# Patient Record
Sex: Male | Born: 1965
Health system: Southern US, Community
[De-identification: ages and names within clinical notes are randomized; demographics above are authoritative.]

## PROBLEM LIST (undated history)

## (undated) DIAGNOSIS — K219 Gastro-esophageal reflux disease without esophagitis: Secondary | ICD-10-CM

## (undated) DIAGNOSIS — T7840XA Allergy, unspecified, initial encounter: Secondary | ICD-10-CM

## (undated) DIAGNOSIS — F319 Bipolar disorder, unspecified: Secondary | ICD-10-CM

## (undated) DIAGNOSIS — N200 Calculus of kidney: Secondary | ICD-10-CM

## (undated) DIAGNOSIS — F1010 Alcohol abuse, uncomplicated: Secondary | ICD-10-CM

## (undated) HISTORY — DX: Alcohol abuse, uncomplicated: F10.10

## (undated) HISTORY — DX: Allergy, unspecified, initial encounter: T78.40XA

## (undated) HISTORY — DX: Calculus of kidney: N20.0

## (undated) HISTORY — DX: Bipolar disorder, unspecified: F31.9

## (undated) HISTORY — DX: Gastro-esophageal reflux disease without esophagitis: K21.9

## (undated) HISTORY — PX: ESOPHAGEAL DILATION: SHX303

## (undated) HISTORY — PX: TONSILECTOMY/ADENOIDECTOMY WITH MYRINGOTOMY: SHX6125

---

## 2005-10-30 ENCOUNTER — Emergency Department: Payer: Self-pay | Admitting: Emergency Medicine

## 2005-10-30 ENCOUNTER — Other Ambulatory Visit: Payer: Self-pay

## 2010-12-28 ENCOUNTER — Encounter: Payer: Self-pay | Admitting: Family Medicine

## 2010-12-28 ENCOUNTER — Ambulatory Visit: Payer: Self-pay | Admitting: Family Medicine

## 2010-12-28 DIAGNOSIS — R609 Edema, unspecified: Secondary | ICD-10-CM

## 2010-12-28 DIAGNOSIS — M25579 Pain in unspecified ankle and joints of unspecified foot: Secondary | ICD-10-CM

## 2010-12-28 DIAGNOSIS — M109 Gout, unspecified: Secondary | ICD-10-CM

## 2010-12-29 ENCOUNTER — Ambulatory Visit: Payer: Self-pay | Admitting: Family Medicine

## 2011-01-01 ENCOUNTER — Telehealth: Payer: Self-pay | Admitting: Family Medicine

## 2011-01-08 NOTE — Progress Notes (Signed)
  Phone Note Call from Patient   Summary of Call: PATIENT CALLED STATED THAT HE NEED A REFILL ON INDOMETHACINE 50 MG PLEASE SEND [facility] HERE. He says that he will not be able to see his PCP until Tues (in 5 days).   He says that it is helping alleviate symptoms.   Initial call taken by: Eugenio Hoes,  January 01, 2011 4:25 PM  Follow-up for Phone Call        Will Refill x 1 and tell the patient that he needs to make sure he follows up with his PCP on Tues as scheduled.  Take with food and drink plenty of clear fluids.   Follow-up by: Standley Dakins MD,  January 01, 2011 5:04 PM    Prescriptions: INDOMETHACIN 50 MG CAPS (INDOMETHACIN) take 1 by mouth three times a day with food as needed ankle, foot pain and swelling  #12 x 0   Entered and Authorized by:   Standley Dakins MD   Signed by:   Standley Dakins MD on 01/01/2011   Method used:   Electronically to        [facility] [address] (retail)       [address]       [place], [place]  [address]       Ph: [phone]       Fax: [phone]   RxID:   [id number]

## 2011-01-08 NOTE — Letter (Signed)
Summary: histoyr form  histoyr form   Imported By: Eugenio Hoes 01/02/2011 13:54:14  _____________________________________________________________________  External Attachment:    Type:   Image     Comment:   External Document

## 2011-01-08 NOTE — Assessment & Plan Note (Signed)
Summary: Gout In Right Foot   Vital Signs:  Patient Profile:   45 Years Old Male CC:      right ankle pain Weight:      199 pounds Temp:     97.9 degrees F oral Pulse rate:   76 / minute Pulse rhythm:   regular Resp:     16 per minute BP sitting:   110 / 80  (left arm) Cuff size:   regular  Pt. in pain?   yes    Location:   right ankle  Vitals Entered By: Providence Crosby LPN (December 28, 2010 1:45 PM)                   Current Allergies (reviewed today): No known allergies History of Present Illness History from: patient Reason for visit: see chief complaint Chief Complaint: right ankle pain History of Present Illness: The patient presented today complaining of an exacerbation of gout in the right 1st toe and right ankle.  He said that he has been having gout attacks intermittently for the past several years.  He says that he has noticed that it started 2 weeks ago, subsided and then became worse in the past 2 days as he has been more physically active at work walking back and forth.  He says he has never been medically diagnosed with gout and has not had any xrays or blood work and he is refusing to have that done at this time.  He says that he does not have medical insurance and will not pay for that.  He says that he feels crystals in his big toe and has been concerned about the pain and swelling. He has bipolar disease and reports that he has been stable on medications.   No injuries to his right ankle, foot or extremities reported.    REVIEW OF SYSTEMS Constitutional Symptoms      Denies fever, chills, night sweats, weight loss, weight gain, and fatigue.  Eyes       Denies change in vision, eye pain, eye discharge, glasses, contact lenses, and eye surgery. Ear/Nose/Throat/Mouth       Denies hearing loss/aids, change in hearing, ear pain, ear discharge, dizziness, frequent runny nose, frequent nose bleeds, sinus problems, sore throat, hoarseness, and tooth pain or bleeding.    Respiratory       Denies dry cough, productive cough, wheezing, shortness of breath, asthma, bronchitis, and emphysema/COPD.  Cardiovascular       Denies murmurs, chest pain, and tires easily with exhertion.    Gastrointestinal       Denies stomach pain, nausea/vomiting, diarrhea, constipation, blood in bowel movements, and indigestion. Genitourniary       Denies painful urination, kidney stones, and loss of urinary control. Neurological       Denies paralysis, seizures, and fainting/blackouts. Musculoskeletal       Complains of joint pain, joint stiffness, and decreased range of motion.      Denies muscle pain, redness, swelling, muscle weakness, and gout.      Comments: right ankle Skin       Denies bruising, unusual mles/lumps or sores, and hair/skin or nail changes.  Psych       Denies mood changes, temper/anger issues, anxiety/stress, speech problems, depression, and sleep problems.  Past History:  Family History: Last updated: 12/28/2010 Healthy per patient  Social History: Last updated: 12/28/2010 Pt denies ETOH, tobacco and recreational drugs  Past Medical History: Bipolar Disorder Gout (?) Anxiety Disorder  Family History: Healthy per patient  Social History: Pt denies ETOH, tobacco and recreational drugs Physical Exam General appearance: well developed, well nourished, no acute distress Head: normocephalic, atraumatic Eyes: conjunctivae and lids normal Ears: normal, no lesions or deformities Nasal: mucosa pink, nonedematous, no septal deviation, turbinates normal Oral/Pharynx: tongue normal, posterior pharynx without erythema or exudate Neck: neck supple,  trachea midline, no masses Extremities: LLE normal; Right ankle mildly swollen; Right big toe not swollen, red or hot; tender with palpation; pt c/o pain with slight movement of the right ankle joint; it was not hot, red but mildly swollen;  Neurological: grossly intact and non-focal Skin: no obvious  rashes or lesions MSE: oriented to time, place, and person Assessment New Problems: ANKLE EDEMA (ICD-782.3) ANKLE PAIN, RIGHT (ICD-719.47) ? of GOUTY ARTHROPATHY UNSPECIFIED (ICD-274.00)   Patient Education: Patient and/or caregiver instructed in the following: rest. The risks, benefits and possible side effects were clearly explained and discussed with the patient.  The patient verbalized clear understanding.  The patient was given instructions to return if symptoms don't improve, worsen or new changes develop.  If it is not during clinic hours and the patient cannot get back to this clinic then the patient was told to seek medical care at an available urgent care or emergency department.  The patient verbalized understanding.   Demonstrates willingness to comply.  Plan New Medications/Changes: INDOMETHACIN 50 MG CAPS (INDOMETHACIN) take 1 by mouth three times a day with food as needed ankle, foot pain and swelling  #12 x 0, 12/28/2010, Teruko Joswick MD  Planning Comments:   THE PATIENT REFUSED TO HAVE FURTHER TESTING TO DETERMINE TRUE CAUSE OF THE ANKLE PAIN AND EDEMA.  HE REFUSED TO GET XRAY DONE AND REFUSED TO GET BLOOD WORK DONE.  I EXPLAINED THE RISKS TO THE PATIENT WHO VERBALIZED UNDERSTANDING.  Follow Up: Follow up in 2-3 days if no improvement, Follow up on an as needed basis, Follow up with Primary Physician  The patient and/or caregiver has been counseled thoroughly with regard to medications prescribed including dosage, schedule, interactions, rationale for use, and possible side effects and they verbalize understanding.  Diagnoses and expected course of recovery discussed and will return if not improved as expected or if the condition worsens. Patient and/or caregiver verbalized understanding.  Prescriptions: INDOMETHACIN 50 MG CAPS (INDOMETHACIN) take 1 by mouth three times a day with food as needed ankle, foot pain and swelling  #12 x 0   Entered and Authorized by:    Standley Dakins MD   Signed by:   Standley Dakins MD on 12/28/2010   Method used:   Electronically to        [facility] [address]retail)       [address], [address]       [place], [place]  [address]       Ph: [phone]       Fax: [phone]   RxID:   [id number]   Patient Instructions: 1)  Go to the pharmacy and pick up your prescription (s).  It may take up to 30 mins for electronic prescriptions to be delivered to the pharmacy.  Please call if your pharmacy has not received your prescriptions after 30 minutes.   2)  Return or go to the ER if no improvement or symptoms getting worse.   3)  I still recommend that you see your regular doctor to have xrays done and blood  work to check for uric acid levels.   4)  If you have no improvement after taking indomethacin tablets please stop the medication and see your doctor immediately.  5)  The patient was informed that there is no on-call provider or services available at this clinic during off-hours (when the clinic is closed).  If the patient developed a problem or concern that required immediate attention, the patient was advised to go the the nearest available urgent care or emergency department for medical care.  The patient verbalized understanding.       I pleaded with the patient to allow me to do more diagnostic studies like xray and blood work but he refused.  I explained the risks to him and he verbalized understanding.  He says that he doesn't have insurance and will not pay to have additional tests run, etc.  I told him that I was not convinced that he really had true gout.  He didn't have much inflammation in the joints where he was having so much pain that I could see on my physical exam today.  I really encouraged him to let me do some more tests to find out more but he refused.  Therefore, I treated him for a possible gout flare up and asked him to please follow up with his PCP so that  they could do the investigation.   I only gave him meds for a few days so that he would have to go and see his PCP soon.  Rodney Langton, MD, CDE, [facility]

## 2012-04-02 LAB — COMPREHENSIVE METABOLIC PANEL
Albumin: 4.3 g/dL (ref 3.4–5.0)
Anion Gap: 6 — ABNORMAL LOW (ref 7–16)
BUN: 6 mg/dL — ABNORMAL LOW (ref 7–18)
Bilirubin,Total: 0.4 mg/dL (ref 0.2–1.0)
Chloride: 106 mmol/L (ref 98–107)
Co2: 31 mmol/L (ref 21–32)
Creatinine: 1.03 mg/dL (ref 0.60–1.30)
EGFR (Non-African Amer.): >60
Potassium: 3.5 mmol/L (ref 3.5–5.1)
SGOT(AST): 86 U/L — ABNORMAL HIGH (ref 15–37)
SGPT (ALT): 124 U/L — ABNORMAL HIGH
Total Protein: 7.8 g/dL (ref 6.4–8.2)

## 2012-04-02 LAB — CBC
HCT: 46.3 % (ref 40.0–52.0)
MCH: 33.6 pg (ref 26.0–34.0)
MCV: 100 fL (ref 80–100)
Platelet: 268 10*3/uL (ref 150–440)
RDW: 15.3 % — ABNORMAL HIGH (ref 11.5–14.5)
WBC: 5.2 10*3/uL (ref 3.8–10.6)

## 2012-04-02 LAB — ACETAMINOPHEN LEVEL: Acetaminophen: <2 ug/mL

## 2012-04-02 LAB — DRUG SCREEN, URINE
Amphetamines, Ur Screen: NEGATIVE (ref ?–1000)
Barbiturates, Ur Screen: NEGATIVE (ref ?–200)
Benzodiazepine, Ur Scrn: NEGATIVE (ref ?–200)
Cannabinoid 50 Ng, Ur ~~LOC~~: NEGATIVE (ref ?–50)
MDMA (Ecstasy)Ur Screen: NEGATIVE (ref ?–500)
Methadone, Ur Screen: NEGATIVE (ref ?–300)
Opiate, Ur Screen: NEGATIVE (ref ?–300)

## 2012-04-02 LAB — SALICYLATE LEVEL: Salicylates, Serum: <1.7 mg/dL

## 2012-04-02 LAB — ETHANOL: Ethanol %: 0.375 % (ref 0.000–0.080)

## 2012-04-03 ENCOUNTER — Inpatient Hospital Stay: Payer: Self-pay | Admitting: Psychiatry

## 2012-04-07 LAB — LITHIUM LEVEL: Lithium: 1 mmol/L

## 2012-07-06 ENCOUNTER — Ambulatory Visit: Payer: Self-pay | Admitting: Gastroenterology

## 2012-10-12 DIAGNOSIS — N200 Calculus of kidney: Secondary | ICD-10-CM

## 2012-10-12 HISTORY — DX: Calculus of kidney: N20.0

## 2014-08-31 ENCOUNTER — Emergency Department: Payer: Self-pay | Admitting: Emergency Medicine

## 2014-08-31 LAB — URINALYSIS, COMPLETE
BILIRUBIN, UR: NEGATIVE
GLUCOSE, UR: NEGATIVE mg/dL (ref 0–75)
KETONE: NEGATIVE
LEUKOCYTE ESTERASE: NEGATIVE
Nitrite: NEGATIVE
PH: 6 (ref 4.5–8.0)
Protein: 100
RBC,UR: 540 /HPF (ref 0–5)
SQUAMOUS EPITHELIAL: NONE SEEN
Specific Gravity: 1.024 (ref 1.003–1.030)

## 2014-09-02 LAB — URINE CULTURE

## 2015-02-03 NOTE — H&P (Signed)
PATIENT NAME:  Jason Hooper, Jason Hooper MR#:  [id number] DATE OF BIRTH:  January 18, 1966  DATE OF ADMISSION:  04/03/2012, assessed on 04/04/2012  REFERRING PHYSICIAN: Lenise Arena, MD   ATTENDING PHYSICIAN: Tanesia Butner B. Bary Leriche, MD    IDENTIFYING DATA: Mr. Jason Hooper is a 49 year old male with history of bipolar illness and alcohol abuse.   CHIEF COMPLAINT: "It is all one misunderstanding."   HISTORY OF PRESENT ILLNESS: Mr. Jason Hooper was brought to the hospital by the police after he reportedly put a gun to his head while drunk. The patient denies. He believes that he was cleaning his 14 year old son's gun at home and was looking into it when his mother saw him. His blood alcohol level was almost 400 at the time of admission. The patient admits that he may not remember everything. He is very blase about his drinking, believes that he drinks only on Saturday and Sunday, and other days he works or takes care of his children. According to his mother, he has not been employed for the past six weeks and drinks vodka daily. The patient believes that he does not need alcohol detox and will go back to drinking sparingly. He is not interested in substance abuse treatment, inpatient or outpatient. He has been following up with his primary provider for treatment of bipolar illness and has been with Dr. Jeananne Rama for many years. In spite of the mother's urging, the patient or his provider do not want to make any changes. He has been relatively stable on a combination of lithium--lithium level is therapeutic, BuSpar, trazodone and Seroquel. The patient does not wish to make any medication changes. He does not wish to see a new provider. He denies any symptoms of depression or anxiety, denies symptoms suggestive of bipolar mania, no psychotic symptoms. He denies, other than alcohol, illicit substance use or prescription pill abuse.   PAST PSYCHIATRIC HISTORY: He was hospitalized in 2001. At that time he had a serious drug problem. Most  likely he was sent to [facility] for substance abuse treatment. He underwent several other substance abuse treatments. His longest period of sobriety was one year. He denies any suicide attempt or other hospitalizations.   FAMILY PSYCHIATRIC HISTORY: None reported.   PAST MEDICAL HISTORY: Hypertension.  ALLERGIES: Codeine.   MEDICATIONS ON ADMISSION:  1. Lithium 600 mg b.i.d.  2. BuSpar 10 mg t.i.d.  3. Propranolol 10 mg q.i.d.  4. Trazodone 50 mg at night. 5. Seroquel XR 300 mg at night.   SOCIAL HISTORY: He has been divorced for 13 years. His wife lives in the area. He stays in touch with two of his children, ages 22 and 14--who is in college now. He even believes that he spends time with them. He was recently laid off from work at [employer]. He denies that alcohol was the reason. He lives with his mother, who is 10 years, and tired of taking care of him. He denies any sexual or physical abuse when growing up.    REVIEW OF SYSTEMS: CONSTITUTIONAL: No fevers or chills. No weight changes. EYES: No double or blurred vision. ENT: No hearing loss. RESPIRATORY: No shortness of breath or cough. CARDIOVASCULAR: No chest pain or orthopnea. GASTROINTESTINAL: No abdominal pain, nausea, vomiting, or diarrhea. GU: No incontinence or frequency. ENDOCRINE: No heat or cold intolerance. LYMPHATIC: No anemia or easy bruising. INTEGUMENTARY: No acne or rash. MUSCULOSKELETAL: No muscle or joint pain. NEUROLOGIC: No tingling or weakness. PSYCHIATRIC: See history of present illness for details.   PHYSICAL EXAMINATION:  VITAL SIGNS: Blood pressure 154/103, pulse 67, respirations 18, temperature 97.6.   GENERAL: This is a well-developed male in no acute distress.   HEENT: The pupils are equal, round, and reactive to light. Sclerae are anicteric.   NECK: Supple. No thyromegaly.   LUNGS: Clear to auscultation. No dullness to percussion.   HEART: Regular rhythm and rate. No murmurs, rubs, or gallops.   ABDOMEN: Soft,  nontender, nondistended. Positive bowel sounds.   MUSCULOSKELETAL: Normal muscle strength in all extremities.   SKIN: No rashes or bruises.   LYMPHATIC: No cervical adenopathy.   NEUROLOGICAL: Cranial nerves II through XII are intact.   LABORATORY, DIAGNOSTIC AND RADIOLOGICAL DATA:  Chemistries are within normal limits except for blood glucose of 122.  Blood alcohol level on admission 0.375.  LFTs are within normal limits except for AST 86, ALT of 124.  TSH 1.58.  Lithium serum 0.62.  Urine toxicology screen negative for substances.  CBC within normal limits.  Serum acetaminophen and salicylates are low.   MENTAL STATUS EXAMINATION ON ADMISSION: The patient is alert and oriented to person, place, time, and situation somewhat. He does not remember exact details leading to his admission. He is pleasant, polite, and cooperative. He is well groomed and casually dressed. He maintains good eye contact. His speech is of normal rhythm, rate, and volume. Mood is fine with full affect. Thought processing is logical and goal oriented. Thought content: He denies suicidal or homicidal ideation but was admitted after reportedly pointing a gun to his head while drunk. There are no delusions or paranoia. There are no auditory or visual hallucinations. His cognition is grossly intact. He registers three out of three and recalls three out of three objects after five minutes. He can spell world forwards and backwards. He can name three past presidents. His insight and judgment are questionable.   SUICIDE RISK ASSESSMENT ON ADMISSION: This is a patient with a long history of alcoholism who reportedly put a gun to his head while drunk. He denies symptoms of depression, anxiety, or psychosis. He denies feeling suicidal or homicidal but wants to continue drinking.      DIAGNOSES:  AXIS I:  1. Alcohol dependence.  2. Alcohol intoxication. 3. Bipolar affective disorder, most recent episode depressed.   AXIS  II: Deferred.   AXIS III: Hypertension.   AXIS IV: Substance abuse, mental illness, poor insight into his problem.   AXIS V: Global Assessment of Functioning score on admission 25.   PLAN: The patient was admitted to [facility] for safety, stabilization, and medication management. He was initially placed on suicide precautions and was closely monitored for any unsafe behaviors. He underwent full psychiatric and risk assessment. He received pharmacotherapy, individual and group psychotherapy, substance abuse counseling, and support from therapeutic milieu.   1. Alcohol detox: The patient was placed on alcohol detox protocol. He denies heavy drinking and wants to be discharged as soon as possible.  2. Substance abuse treatment: He denies inpatient, outpatient or rehab substance abuse treatment.  3. Bipolar: He does not wish any medication changes, wants to be continued on the same medication as prescribed by Dr. Jeananne Rama. He does not want to change his providers. However, given the fact that he is a crisis patient, we will make an appointment at the [facility] with a psychiatrist. Hopefully he will wise up and take substance abuse counseling as well.  4. Disposition: Most likely with his mother. Follow up with [facility].   ____________________________ Wardell Honour Maleko Greulich, MD jbp:cbb D: 04/04/2012 17:24:09 ET T: 04/04/2012 18:20:30 ET JOB#: [id number]  cc: Yeray Tomas B. Bary Leriche, MD, <Dictator> Clovis Fredrickson MD ELECTRONICALLY SIGNED 04/08/2012 9:17

## 2015-02-28 ENCOUNTER — Inpatient Hospital Stay
Admission: EM | Admit: 2015-02-28 | Discharge: 2015-03-05 | DRG: 897 | Disposition: A | Discharge status: Home / Self Care | Payer: Self-pay | Attending: Internal Medicine | Admitting: Internal Medicine

## 2015-02-28 ENCOUNTER — Encounter: Payer: Self-pay | Admitting: Emergency Medicine

## 2015-02-28 DIAGNOSIS — R569 Unspecified convulsions: Secondary | ICD-10-CM | POA: Diagnosis present

## 2015-02-28 DIAGNOSIS — F10230 Alcohol dependence with withdrawal, uncomplicated: Secondary | ICD-10-CM

## 2015-02-28 DIAGNOSIS — F29 Unspecified psychosis not due to a substance or known physiological condition: Secondary | ICD-10-CM | POA: Diagnosis present

## 2015-02-28 DIAGNOSIS — I10 Essential (primary) hypertension: Secondary | ICD-10-CM | POA: Diagnosis present

## 2015-02-28 DIAGNOSIS — F10239 Alcohol dependence with withdrawal, unspecified: Secondary | ICD-10-CM | POA: Diagnosis present

## 2015-02-28 DIAGNOSIS — F10231 Alcohol dependence with withdrawal delirium: Principal | ICD-10-CM | POA: Diagnosis present

## 2015-02-28 DIAGNOSIS — F10931 Alcohol use, unspecified with withdrawal delirium: Secondary | ICD-10-CM

## 2015-02-28 DIAGNOSIS — E876 Hypokalemia: Secondary | ICD-10-CM | POA: Diagnosis present

## 2015-02-28 DIAGNOSIS — F10930 Alcohol use, unspecified with withdrawal, uncomplicated: Secondary | ICD-10-CM

## 2015-02-28 DIAGNOSIS — F312 Bipolar disorder, current episode manic severe with psychotic features: Secondary | ICD-10-CM | POA: Diagnosis present

## 2015-02-28 LAB — CBC
HCT: 53.6 % — ABNORMAL HIGH (ref 40.0–52.0)
HEMOGLOBIN: 17.9 g/dL (ref 13.0–18.0)
MCH: 35.6 pg — ABNORMAL HIGH (ref 26.0–34.0)
MCHC: 33.5 g/dL (ref 32.0–36.0)
MCV: 106.2 fL — ABNORMAL HIGH (ref 80.0–100.0)
Platelets: 268 10*3/uL (ref 150–440)
RBC: 5.04 MIL/uL (ref 4.40–5.90)
RDW: 14.6 % — ABNORMAL HIGH (ref 11.5–14.5)
WBC: 8.6 10*3/uL (ref 3.8–10.6)

## 2015-02-28 LAB — LITHIUM LEVEL: Lithium Lvl: <0.06 mmol/L — ABNORMAL LOW (ref 0.60–1.20)

## 2015-02-28 MED ORDER — NICOTINE 21 MG/24HR TD PT24
21.0000 mg | MEDICATED_PATCH | Freq: Every day | TRANSDERMAL | Status: DC
  Administered 2015-02-28: 21 mg via TRANSDERMAL
  Administered 2015-03-01: 10:00:00 via TRANSDERMAL
  Administered 2015-03-02 – 2015-03-04 (×3): 21 mg via TRANSDERMAL
  Filled 2015-02-28 (×3): qty 1

## 2015-02-28 MED ORDER — MIDAZOLAM HCL 2 MG/2ML IJ SOLN
2.0000 mg | Freq: Once | INTRAMUSCULAR | Status: AC
  Administered 2015-02-28: 2 mg via INTRAMUSCULAR

## 2015-02-28 MED ORDER — LORAZEPAM 2 MG/ML IJ SOLN
0.0000 mg | Freq: Four times a day (QID) | INTRAMUSCULAR | Status: DC
Start: 2015-02-28 — End: 2015-03-01
  Administered 2015-03-01: 1 mg via INTRAVENOUS

## 2015-02-28 MED ORDER — THIAMINE HCL 100 MG/ML IJ SOLN
INTRAMUSCULAR | Status: AC
  Administered 2015-02-28: 100 mg via INTRAVENOUS
  Filled 2015-02-28: qty 2

## 2015-02-28 MED ORDER — THIAMINE HCL 100 MG/ML IJ SOLN
100.0000 mg | Freq: Every day | INTRAMUSCULAR | Status: DC
  Administered 2015-02-28: 100 mg via INTRAVENOUS

## 2015-02-28 MED ORDER — NICOTINE 21 MG/24HR TD PT24
MEDICATED_PATCH | TRANSDERMAL | Status: AC
  Administered 2015-02-28: 21 mg via TRANSDERMAL
  Filled 2015-02-28: qty 1

## 2015-02-28 MED ORDER — VITAMIN B-1 100 MG PO TABS: 100.0000 mg | ORAL_TABLET | Freq: Every day | ORAL | Status: DC

## 2015-02-28 MED ORDER — LORAZEPAM 2 MG PO TABS: 0.0000 mg | ORAL_TABLET | Freq: Four times a day (QID) | ORAL | Status: DC

## 2015-02-28 MED ORDER — VITAMIN B-1 100 MG PO TABS
ORAL_TABLET | ORAL | Status: AC
  Administered 2015-02-28: 100 mg via ORAL
  Filled 2015-02-28: qty 1

## 2015-02-28 MED ORDER — LORAZEPAM 2 MG/ML IJ SOLN: 0.0000 mg | Freq: Two times a day (BID) | INTRAMUSCULAR | Status: DC

## 2015-02-28 MED ORDER — LORAZEPAM 2 MG PO TABS
0.0000 mg | ORAL_TABLET | Freq: Four times a day (QID) | ORAL | Status: DC
  Administered 2015-03-01 (×2): 2 mg via ORAL

## 2015-02-28 MED ORDER — SALINE SPRAY 0.65 % NA SOLN
1.0000 | Freq: Once | NASAL | Status: AC
  Administered 2015-02-28: 1 via NASAL
  Filled 2015-02-28: qty 44

## 2015-02-28 MED ORDER — MIDAZOLAM HCL 5 MG/5ML IJ SOLN
INTRAMUSCULAR | Status: AC
  Administered 2015-02-28: 2 mg via INTRAMUSCULAR
  Filled 2015-02-28: qty 5

## 2015-02-28 MED ORDER — VITAMIN B-1 100 MG PO TABS
100.0000 mg | ORAL_TABLET | Freq: Every day | ORAL | Status: DC
  Administered 2015-02-28 – 2015-03-05 (×5): 100 mg via ORAL
  Filled 2015-02-28 (×3): qty 1

## 2015-02-28 MED ORDER — LORAZEPAM 2 MG PO TABS
0.0000 mg | ORAL_TABLET | Freq: Two times a day (BID) | ORAL | Status: DC
  Administered 2015-03-01: 1 mg via ORAL

## 2015-02-28 MED ORDER — THIAMINE HCL 100 MG/ML IJ SOLN
Freq: Once | INTRAVENOUS | Status: AC
  Administered 2015-02-28: 21:00:00 via INTRAVENOUS
  Filled 2015-02-28: qty 1000

## 2015-02-28 MED ORDER — LORAZEPAM 2 MG/ML IJ SOLN: 0.0000 mg | Freq: Four times a day (QID) | INTRAMUSCULAR | Status: DC

## 2015-02-28 MED ORDER — LORAZEPAM 2 MG PO TABS: 0.0000 mg | ORAL_TABLET | Freq: Two times a day (BID) | ORAL | Status: DC

## 2015-02-28 MED ORDER — THIAMINE HCL 100 MG/ML IJ SOLN: 100.0000 mg | Freq: Every day | INTRAMUSCULAR | Status: DC

## 2015-02-28 NOTE — ED Provider Notes (Signed)
[facility] Emergency Department Provider Note  ____________________________________________  Time seen: Approximately 7:44 PM  I have reviewed the triage vital signs and the nursing notes.   HISTORY  Chief Complaint Alcohol Problem and Hallucinations  History and physical are both limited by psychosis/ETOH withdrawal  HPI Jason Hooper is a 49 y.o. male with a history of bipolar disorder and severe alcohol abuse who presents wanting detox but is actively hallucinating, has labile emotions, and aggressive and unpredictable behavior.  His mother is present with him.  She states that when he has been drinking he does not take his psychiatric medications.  During a lucid moment, the patient did state that he has not been taking any of his catching medications for "a long time".  He last had a drink earlier today.   No past medical history on file.  There are no active problems to display for this patient.   No past surgical history on file.  No current outpatient prescriptions on file.  Allergies Review of patient's allergies indicates no known allergies.  No family history on file.  Social History History  Substance Use Topics  . Smoking status: Never Smoker   . Smokeless tobacco: Current User  . Alcohol Use: Yes    Review of Systems Unable to obtain due to psychosis  ____________________________________________   PHYSICAL EXAM:  VITAL SIGNS: ED Triage Vitals  Enc Vitals Group     BP 02/28/15 1908 142/98 mmHg     Pulse Rate 02/28/15 1908 152     Resp 02/28/15 1908 32     Temp 02/28/15 1908 99.5 F (37.5 C)     Temp Source 02/28/15 1908 Oral     SpO2 02/28/15 1908 96 %     Weight 02/28/15 1908 215 lb (97.523 kg)     Height 02/28/15 1908 6\' 2"  (1.88 m)     Head Cir --      Peak Flow --      Pain Score 02/28/15 1922 10     Pain Loc --      Pain Edu? --      Excl. in [place]? --     Constitutional: In distress, thrashing around and then  lying still. Eyes: Conjunctivae are normal. PERRL. EOMI. Head: Atraumatic. Nose: No congestion/rhinnorhea. Mouth/Throat: Mucous membranes are moist.   Neck: No stridor.   Cardiovascular: Tachycardia with regular rhythm. Grossly normal heart sounds.  Good peripheral circulation. Respiratory: Tachypnea.  No retractions. Lungs CTAB. Gastrointestinal: Soft and nontender. No distention. No abdominal bruits. No CVA tenderness. Musculoskeletal: No lower extremity tenderness nor edema.  No joint effusions. Neurologic:  Speech is not slurred. No gross focal neurologic deficits are appreciated. Speech is normal. No gait instability. Skin:  Skin is diaphoretic and intact. No rash noted. Psychiatric: Emotions are labile, patient is reacting to internal stimuli, he is exhibiting intermittent but violent, aggressive behavior.  Unpredictable and a danger to himself and others..  ____________________________________________   LABS   Chemistries pending after 5 hours.  Lithium undetectable.  UA +hematuria.  EKG  Not performed given psychosis ____________________________________________  RADIOLOGY  Not indicated  ____________________________________________   PROCEDURES  Procedure(s) performed: None  Critical Care performed: Yes, see critical care note(s)   CRITICAL CARE Performed by: Hinda Kehr   Total critical care time: 30  Critical care time was exclusive of separately billable procedures and treating other patients.  Critical care was necessary to treat or prevent imminent or life-threatening deterioration.  Critical care was  time spent personally by me on the following activities: development of treatment plan with patient and/or surrogate as well as nursing, discussions with consultants, evaluation of patient's response to treatment, examination of patient, obtaining history from patient or surrogate, ordering and performing treatments and interventions, ordering and review  of laboratory studies, ordering and review of radiographic studies, pulse oximetry and re-evaluation of patient's condition.  ____________________________________________   INITIAL IMPRESSION / ASSESSMENT AND PLAN / ED COURSE  Pertinent labs & imaging results that were available during my care of the patient were reviewed by me and considered in my medical decision making (see chart for details).  The patient is acutely psychotic and labile.  I called the police officers to the room given concern for the patient's safety and the safety of others around him.  He had a moment of lucidity and we were able to give him 2 mg of Versed IM to treat both his agitation and the presumed alcohol withdrawal.  He was extremely somnolent afterwards so I did not give him antipsychotics as well.  I have ordered CIWA protocol, IVC, and psychiatric evaluation.  I do not know if his psychosis is due primarily to being unmedicated for his bipolar disorder or whether it is alcoholic psychosis.  A psychiatry evaluation will be useful in determining if he needs inpatient management for his alcohol withdrawal or if this is primarily psychiatric.  I had a discussion with the patient's mother and explained the need for the IVC.  I also reassessed him twice after the Versed was administered.  He was moved to the psych side of the emergency department as soon as a bed was available.    ----------------------------------------- 12:17 AM on 03/01/2015 -----------------------------------------  I checked about 10 minutes ago to follow up on labs and the patient's chemistries were still not available after 5 hours.  I contacted the lab is spoke with the technician who is looking into it to determine why there is a delay.  ____________________________________________  FINAL CLINICAL IMPRESSION(S) / ED DIAGNOSES  Final diagnoses:  Psychosis, unspecified psychosis type  Alcohol withdrawal, uncomplicated  Bipolar affective  disorder, currently manic, severe, with psychotic features        Hinda Kehr, MD 03/01/15 0020

## 2015-03-01 ENCOUNTER — Encounter: Payer: Self-pay | Admitting: *Deleted

## 2015-03-01 DIAGNOSIS — F10939 Alcohol use, unspecified with withdrawal, unspecified: Secondary | ICD-10-CM | POA: Insufficient documentation

## 2015-03-01 DIAGNOSIS — F10239 Alcohol dependence with withdrawal, unspecified: Secondary | ICD-10-CM | POA: Diagnosis present

## 2015-03-01 DIAGNOSIS — F312 Bipolar disorder, current episode manic severe with psychotic features: Secondary | ICD-10-CM | POA: Diagnosis present

## 2015-03-01 LAB — CBC
HEMATOCRIT: 45.7 % (ref 40.0–52.0)
Hemoglobin: 15.5 g/dL (ref 13.0–18.0)
MCH: 35.6 pg — AB (ref 26.0–34.0)
MCHC: 33.9 g/dL (ref 32.0–36.0)
MCV: 105 fL — ABNORMAL HIGH (ref 80.0–100.0)
PLATELETS: 194 10*3/uL (ref 150–440)
RBC: 4.35 MIL/uL — ABNORMAL LOW (ref 4.40–5.90)
RDW: 14.3 % (ref 11.5–14.5)
WBC: 6.9 10*3/uL (ref 3.8–10.6)

## 2015-03-01 LAB — URINE DRUG SCREEN, QUALITATIVE (ARMC ONLY)
Amphetamines, Ur Screen: NOT DETECTED
BARBITURATES, UR SCREEN: NOT DETECTED
BENZODIAZEPINE, UR SCRN: POSITIVE — AB
CANNABINOID 50 NG, UR ~~LOC~~: NOT DETECTED
Cocaine Metabolite,Ur ~~LOC~~: NOT DETECTED
MDMA (Ecstasy)Ur Screen: NOT DETECTED
METHADONE SCREEN, URINE: NOT DETECTED
Opiate, Ur Screen: NOT DETECTED
PHENCYCLIDINE (PCP) UR S: NOT DETECTED
Tricyclic, Ur Screen: POSITIVE — AB

## 2015-03-01 LAB — COMPREHENSIVE METABOLIC PANEL
ALK PHOS: 99 U/L (ref 38–126)
ALT: 96 U/L — ABNORMAL HIGH (ref 17–63)
ANION GAP: 20 — AB (ref 5–15)
AST: 125 U/L — ABNORMAL HIGH (ref 15–41)
Albumin: 5 g/dL (ref 3.5–5.0)
BUN: 13 mg/dL (ref 6–20)
CO2: 20 mmol/L — AB (ref 22–32)
Calcium: 9.6 mg/dL (ref 8.9–10.3)
Chloride: 103 mmol/L (ref 101–111)
Creatinine, Ser: 1.05 mg/dL (ref 0.61–1.24)
GLUCOSE: 168 mg/dL — AB (ref 65–99)
Potassium: 3.8 mmol/L (ref 3.5–5.1)
Sodium: 143 mmol/L (ref 135–145)
TOTAL PROTEIN: 8.2 g/dL — AB (ref 6.5–8.1)
Total Bilirubin: 0.4 mg/dL (ref 0.3–1.2)

## 2015-03-01 LAB — URINALYSIS COMPLETE WITH MICROSCOPIC (ARMC ONLY)
BILIRUBIN URINE: NEGATIVE
GLUCOSE, UA: NEGATIVE mg/dL
Hgb urine dipstick: NEGATIVE
LEUKOCYTES UA: NEGATIVE
Nitrite: NEGATIVE
PH: 5 (ref 5.0–8.0)
Protein, ur: NEGATIVE mg/dL
SQUAMOUS EPITHELIAL / LPF: NONE SEEN
Specific Gravity, Urine: 1.013 (ref 1.005–1.030)

## 2015-03-01 LAB — CREATININE, SERUM
Creatinine, Ser: 0.8 mg/dL (ref 0.61–1.24)
GFR calc Af Amer: >60 mL/min (ref >60)
GFR calc non Af Amer: >60 mL/min (ref >60)

## 2015-03-01 LAB — ETHANOL: Alcohol, Ethyl (B): 428 mg/dL (ref <5)

## 2015-03-01 LAB — ACETAMINOPHEN LEVEL: Acetaminophen (Tylenol), Serum: <10 ug/mL — ABNORMAL LOW (ref 10–30)

## 2015-03-01 LAB — SALICYLATE LEVEL

## 2015-03-01 MED ORDER — CHLORDIAZEPOXIDE HCL 25 MG PO CAPS
50.0000 mg | ORAL_CAPSULE | ORAL | Status: AC
  Administered 2015-03-01: 50 mg via ORAL

## 2015-03-01 MED ORDER — ENOXAPARIN SODIUM 40 MG/0.4ML ~~LOC~~ SOLN
40.0000 mg | SUBCUTANEOUS | Status: DC
  Administered 2015-03-01 – 2015-03-04 (×4): 40 mg via SUBCUTANEOUS
  Filled 2015-03-01 (×4): qty 0.4

## 2015-03-01 MED ORDER — SODIUM CHLORIDE 0.9 % IV SOLN
Freq: Once | INTRAVENOUS | Status: AC
  Administered 2015-03-01: 1000 mL via INTRAVENOUS

## 2015-03-01 MED ORDER — LORAZEPAM 2 MG PO TABS
0.0000 mg | ORAL_TABLET | ORAL | Status: DC | PRN
  Filled 2015-03-01: qty 1

## 2015-03-01 MED ORDER — LAMOTRIGINE 25 MG PO TABS
25.0000 mg | ORAL_TABLET | Freq: Every day | ORAL | Status: DC
  Administered 2015-03-01 – 2015-03-05 (×4): 25 mg via ORAL
  Filled 2015-03-01 (×5): qty 1

## 2015-03-01 MED ORDER — LORAZEPAM 2 MG PO TABS
2.0000 mg | ORAL_TABLET | Freq: Once | ORAL | Status: AC
  Administered 2015-03-01: 2 mg via ORAL

## 2015-03-01 MED ORDER — LORAZEPAM 2 MG/ML IJ SOLN: 2.0000 mg | INTRAMUSCULAR | Status: DC

## 2015-03-01 MED ORDER — ACETAMINOPHEN 650 MG RE SUPP: 650.0000 mg | Freq: Four times a day (QID) | RECTAL | Status: DC | PRN

## 2015-03-01 MED ORDER — LORAZEPAM 1 MG PO TABS
ORAL_TABLET | ORAL | Status: AC
  Administered 2015-03-01: 1 mg via ORAL
  Filled 2015-03-01: qty 1

## 2015-03-01 MED ORDER — THIAMINE HCL 100 MG/ML IJ SOLN
Freq: Once | INTRAVENOUS | Status: AC
  Administered 2015-03-01: 17:00:00 via INTRAVENOUS
  Filled 2015-03-01: qty 1000

## 2015-03-01 MED ORDER — PROPRANOLOL HCL ER 60 MG PO CP24
60.0000 mg | ORAL_CAPSULE | Freq: Every day | ORAL | Status: DC
  Administered 2015-03-01 – 2015-03-05 (×4): 60 mg via ORAL
  Filled 2015-03-01 (×6): qty 1

## 2015-03-01 MED ORDER — BUSPIRONE HCL 10 MG PO TABS
10.0000 mg | ORAL_TABLET | Freq: Two times a day (BID) | ORAL | Status: DC
  Administered 2015-03-01 – 2015-03-05 (×7): 10 mg via ORAL
  Filled 2015-03-01 (×2): qty 1
  Filled 2015-03-01: qty 2
  Filled 2015-03-01: qty 1
  Filled 2015-03-01: qty 2
  Filled 2015-03-01: qty 1
  Filled 2015-03-01 (×3): qty 2
  Filled 2015-03-01: qty 1

## 2015-03-01 MED ORDER — LORAZEPAM 2 MG/ML IJ SOLN
0.0000 mg | INTRAMUSCULAR | Status: DC | PRN
  Administered 2015-03-02 – 2015-03-04 (×4): 2 mg via INTRAVENOUS
  Filled 2015-03-01 (×4): qty 1

## 2015-03-01 MED ORDER — ACETAMINOPHEN 325 MG PO TABS: 650.0000 mg | ORAL_TABLET | Freq: Four times a day (QID) | ORAL | Status: DC | PRN

## 2015-03-01 MED ORDER — TRAZODONE HCL 100 MG PO TABS
100.0000 mg | ORAL_TABLET | Freq: Every day | ORAL | Status: DC
  Administered 2015-03-01 – 2015-03-04 (×3): 100 mg via ORAL
  Filled 2015-03-01: qty 2
  Filled 2015-03-01 (×2): qty 1

## 2015-03-01 MED ORDER — NICOTINE 21 MG/24HR TD PT24
MEDICATED_PATCH | TRANSDERMAL | Status: AC
  Filled 2015-03-01: qty 1

## 2015-03-01 MED ORDER — LORAZEPAM 2 MG PO TABS
ORAL_TABLET | ORAL | Status: AC
  Filled 2015-03-01: qty 1

## 2015-03-01 MED ORDER — LORAZEPAM 2 MG/ML IJ SOLN
INTRAMUSCULAR | Status: AC
  Administered 2015-03-01: 1 mg via INTRAVENOUS
  Filled 2015-03-01: qty 1

## 2015-03-01 MED ORDER — VITAMIN B-1 100 MG PO TABS
ORAL_TABLET | ORAL | Status: AC
  Administered 2015-03-01: 100 mg via ORAL
  Filled 2015-03-01: qty 1

## 2015-03-01 MED ORDER — CHLORDIAZEPOXIDE HCL 25 MG PO CAPS
ORAL_CAPSULE | ORAL | Status: AC
  Administered 2015-03-01: 50 mg via ORAL
  Filled 2015-03-01: qty 1

## 2015-03-01 MED ORDER — LORAZEPAM 2 MG PO TABS
ORAL_TABLET | ORAL | Status: AC
  Administered 2015-03-01: 2 mg via ORAL
  Filled 2015-03-01: qty 1

## 2015-03-01 MED ORDER — LAMOTRIGINE 100 MG PO TABS
ORAL_TABLET | ORAL | Status: AC
  Administered 2015-03-01: 25 mg via ORAL
  Filled 2015-03-01: qty 1

## 2015-03-01 MED ORDER — ARIPIPRAZOLE 5 MG PO TABS
5.0000 mg | ORAL_TABLET | Freq: Every day | ORAL | Status: DC
  Administered 2015-03-01 – 2015-03-02 (×2): 5 mg via ORAL
  Filled 2015-03-01 (×2): qty 1

## 2015-03-01 NOTE — ED Notes (Signed)
Dr. Laury Axon is here to see pt at this time.

## 2015-03-01 NOTE — ED Notes (Signed)
BEHAVIORAL HEALTH ROUNDING Patient sleeping: Yes.   Patient alert and oriented: yes Behavior appropriate: Yes.  ; If no, describe:  Nutrition and fluids offered: Yes  Toileting and hygiene offered: Yes  Sitter present: no Law enforcement present: Yes  

## 2015-03-01 NOTE — ED Notes (Signed)
Patient assigned to appropriate care area. Patient oriented to unit/care area: Informed that, for their safety, care areas are designed for safety and monitored by security cameras at all times; and visiting hours explained to patient. Patient verbalizes understanding, and verbal contract for safety obtained. 

## 2015-03-01 NOTE — ED Notes (Signed)
BEHAVIORAL HEALTH ROUNDING Patient sleeping: Yes.   Patient alert and oriented: not applicable Behavior appropriate: Yes.    Nutrition and fluids offered: No Toileting and hygiene offered: No Sitter present: q15 minute observations Law enforcement present: Yes [employer] 

## 2015-03-01 NOTE — ED Notes (Signed)
BEHAVIORAL HEALTH ROUNDING Patient sleeping: Yes.   Patient alert and oriented: not applicable Behavior appropriate: Yes.    Nutrition and fluids offered: No Toileting and hygiene offered: No Sitter present: Erlene Quan ED [employer] present: Yes [employer]

## 2015-03-01 NOTE — ED Provider Notes (Signed)
-----------------------------------------   2:24 PM on 03/01/2015 -----------------------------------------  Dr. Weber Cooks can't give me an update after he saw the patient in the emergency department. The patient appears to be having acute alcohol withdrawal symptoms ongoing since his last drink was yesterday. He is currently hypertensive and borderline tachycardic with active tremors. Although alert with me he had disclosed to Dr. Weber Cooks that he's been having hallucinations, visual. Based on the way he looks at this moment he is given an additional 2 mg dose of Ativan.   I discussed the case with Dr. Volanda Napoleon with the hospitalist for admission. Dr. Weber Cooks will follow along.  Lisa Roca, MD 03/01/15 1426

## 2015-03-01 NOTE — ED Notes (Signed)
BEHAVIORAL HEALTH ROUNDING Patient sleeping: No. Patient alert and oriented: yes Behavior appropriate: Yes.  ; If no, describe:  Nutrition and fluids offered: Yes  Toileting and hygiene offered: Yes  Sitter present: no Law enforcement present: Yes  

## 2015-03-01 NOTE — ED Notes (Signed)
CIWA scale not done at this time. Pt sleeping soundly on stomach on stretcher.

## 2015-03-01 NOTE — Consult Note (Signed)
[facility] Consult   Reason for Consult: Consult for patient who presented to the emergency room with complaints of agitation. His chief complaint to me his detox Referring Physician:  lord Patient Identification: Jason Hooper MRN:  [id number] Principal Diagnosis: <principal problem not specified> Diagnosis:  There are no active problems to display for this patient.   Total Time spent with patient: 1.5 hours  Subjective:   [name] is a 49 y.o. male patient admitted with patient was brought to the emergency room extremely intoxicated. Reports were given of agitation. He tells me his chief complaint is "I need detox".  HPI:  Patient and chart consulted for history. Patient states he needs detox. He says he started back to drinking heavily around Christmas time and has been gradually escalating it. He is now drinking at least a pint of liquor a day. His last drink was sometime in the middle of the day yesterday. He presented to the emergency room with a blood alcohol level of 400. He says he is not abusing any other drugs. The alcohol use is affecting him physically and making it impossible for him to work. Patient's mood has been dysphoric he also gets irritable when he is drinking. Sleep has been poor appetite is been poor. He denies that he's had any suicidal ideation or homicidal ideation. Admits that his temper gets labile. He says that he has been compliant with his prescription medicine.  Past psychiatric history for long-standing alcohol abuse with multiple detoxes. Longest sobriety is been a few months. At some point he is also been diagnosed as "bipolar" and has been prescribed medicines for mood. It's not clear whether any of his mood symptoms of been present during a time of sobriety. He denies ever trying to kill himself. Does have previous hospitalizations and has also been sent to the alcohol and drug abuse treatment center.  Social history: Lives with his mother.  Works doing Architect work. Has 2 children who he doesn't see very often.  Medical history: History of high blood pressure for which he takes propranolol no other known ongoing medical problems  Family history: Denies any family history of mental illness  Current medications Abilify, trazodone, BuSpar, lamotrigine and propranolol. Patient doesn't know the dose of any of these. HPI Elements:   Quality:  Shaking and trembling knowledge is. Severity:  Severe. Timing:  recent. Duration:  daily. Context:  drinking.  Past Medical History: No past medical history on file. No past surgical history on file. Family History: No family history on file. Social History:  History  Alcohol Use  . Yes     History  Drug Use No    History   Social History  . Marital Status: Single    Spouse Name: N/A  . Number of Children: N/A  . Years of Education: N/A   Social History Main Topics  . Smoking status: Never Smoker   . Smokeless tobacco: Current User  . Alcohol Use: Yes  . Drug Use: No  . Sexual Activity: Not on file   Other Topics Concern  . Not on file   Social History Narrative  . No narrative on file   Additional Social History:                          Allergies:  No Known Allergies  Labs:  Results for orders placed or performed during the hospital encounter of 02/28/15 (from the past 48 hour(s))  Acetaminophen level     Status: Abnormal   Collection Time: 02/28/15  7:32 PM  Result Value Ref Range   Acetaminophen (Tylenol), Serum <10 (L) 10 - 30 ug/mL    Comment:        THERAPEUTIC CONCENTRATIONS VARY SIGNIFICANTLY. A RANGE OF 10-30 ug/mL MAY BE AN EFFECTIVE CONCENTRATION FOR MANY PATIENTS. HOWEVER, SOME ARE BEST TREATED AT CONCENTRATIONS OUTSIDE THIS RANGE. ACETAMINOPHEN CONCENTRATIONS >150 ug/mL AT 4 HOURS AFTER INGESTION AND >50 ug/mL AT 12 HOURS AFTER INGESTION ARE OFTEN ASSOCIATED WITH TOXIC REACTIONS.   CBC     Status: Abnormal   Collection  Time: 02/28/15  7:32 PM  Result Value Ref Range   WBC 8.6 3.8 - 10.6 K/uL   RBC 5.04 4.40 - 5.90 MIL/uL   Hemoglobin 17.9 13.0 - 18.0 g/dL   HCT 53.6 (H) 40.0 - 52.0 %   MCV 106.2 (H) 80.0 - 100.0 fL   MCH 35.6 (H) 26.0 - 34.0 pg   MCHC 33.5 32.0 - 36.0 g/dL   RDW 14.6 (H) 11.5 - 14.5 %   Platelets 268 150 - 440 K/uL  Comprehensive metabolic panel     Status: Abnormal   Collection Time: 02/28/15  7:32 PM  Result Value Ref Range   Sodium 143 135 - 145 mmol/L   Potassium 3.8 3.5 - 5.1 mmol/L   Chloride 103 101 - 111 mmol/L   CO2 20 (L) 22 - 32 mmol/L   Glucose, Bld 168 (H) 65 - 99 mg/dL   BUN 13 6 - 20 mg/dL   Creatinine, Ser 1.05 0.61 - 1.24 mg/dL   Calcium 9.6 8.9 - 10.3 mg/dL   Total Protein 8.2 (H) 6.5 - 8.1 g/dL   Albumin 5.0 3.5 - 5.0 g/dL   AST 125 (H) 15 - 41 U/L   ALT 96 (H) 17 - 63 U/L   Alkaline Phosphatase 99 38 - 126 U/L   Total Bilirubin 0.4 0.3 - 1.2 mg/dL   GFR calc non Af Amer >60 >60 mL/min   GFR calc Af Amer >60 >60 mL/min    Comment: (NOTE) The eGFR has been calculated using the CKD EPI equation. This calculation has not been validated in all clinical situations. eGFR's persistently <60 mL/min signify possible Chronic Kidney Disease.    Anion gap 20 (H) 5 - 15  Ethanol (ETOH)     Status: Abnormal   Collection Time: 02/28/15  7:32 PM  Result Value Ref Range   Alcohol, Ethyl (B) 428 (HH) <5 mg/dL    Comment: CRITICAL RESULT CALLED TO, READ BACK BY AND VERIFIED WITH BETH BRANDEL _0  03/01/15 BY AJO        LOWEST DETECTABLE LIMIT FOR SERUM ALCOHOL IS 11 mg/dL FOR MEDICAL PURPOSES ONLY   Salicylate level     Status: None   Collection Time: 02/28/15  7:32 PM  Result Value Ref Range   Salicylate Lvl <4.6 2.8 - 30.0 mg/dL  Lithium level     Status: Abnormal   Collection Time: 02/28/15  7:32 PM  Result Value Ref Range   Lithium Lvl <0.06 (L) 0.60 - 1.20 mmol/L  Urinalysis complete, with microscopic [facility])     Status: Abnormal   Collection Time:  03/01/15  3:20 AM  Result Value Ref Range   Color, Urine YELLOW (A) YELLOW   APPearance CLEAR (A) CLEAR   Glucose, UA NEGATIVE NEGATIVE mg/dL   Bilirubin Urine NEGATIVE NEGATIVE   Ketones, ur TRACE (A) NEGATIVE mg/dL   Specific Gravity, Urine  1.013 1.005 - 1.030   Hgb urine dipstick NEGATIVE NEGATIVE   pH 5.0 5.0 - 8.0   Protein, ur NEGATIVE NEGATIVE mg/dL   Nitrite NEGATIVE NEGATIVE   Leukocytes, UA NEGATIVE NEGATIVE   RBC / HPF 0-5 0 - 5 RBC/hpf   WBC, UA 0-5 0 - 5 WBC/hpf   Bacteria, UA RARE (A) NONE SEEN   Squamous Epithelial / LPF NONE SEEN NONE SEEN   Mucous PRESENT   Urine Drug Screen, [facility])     Status: Abnormal   Collection Time: 03/01/15  3:28 AM  Result Value Ref Range   Tricyclic, Ur Screen POSITIVE (A) NONE DETECTED   Amphetamines, Ur Screen NONE DETECTED NONE DETECTED   MDMA (Ecstasy)Ur Screen NONE DETECTED NONE DETECTED   Cocaine Metabolite,Ur [place] NONE DETECTED NONE DETECTED   Opiate, Ur Screen NONE DETECTED NONE DETECTED   Phencyclidine (PCP) Ur S NONE DETECTED NONE DETECTED   Cannabinoid 50 Ng, Ur [place] NONE DETECTED NONE DETECTED   Barbiturates, Ur Screen NONE DETECTED NONE DETECTED   Benzodiazepine, Ur Scrn POSITIVE (A) NONE DETECTED   Methadone Scn, Ur NONE DETECTED NONE DETECTED    Comment: (NOTE) 174  Tricyclics, urine               Cutoff 1000 ng/mL 200  Amphetamines, urine             Cutoff 1000 ng/mL 300  MDMA (Ecstasy), urine           Cutoff 500 ng/mL 400  Cocaine Metabolite, urine       Cutoff 300 ng/mL 500  Opiate, urine                   Cutoff 300 ng/mL 600  Phencyclidine (PCP), urine      Cutoff 25 ng/mL 700  Cannabinoid, urine              Cutoff 50 ng/mL 800  Barbiturates, urine             Cutoff 200 ng/mL 900  Benzodiazepine, urine           Cutoff 200 ng/mL 1000 Methadone, urine                Cutoff 300 ng/mL 1100 1200 The urine drug screen provides only a preliminary, unconfirmed 1300 analytical test result and should not be  used for non-medical 1400 purposes. Clinical consideration and professional judgment should 1500 be applied to any positive drug screen result due to possible 1600 interfering substances. A more specific alternate chemical method 1700 must be used in order to obtain a confirmed analytical result.  1800 Gas chromato graphy / mass spectrometry (GC/MS) is the preferred 1900 confirmatory method.     Vitals: Blood pressure 142/115, pulse 96, temperature 97.6 F (36.4 C), temperature source Oral, resp. rate 20, height _0  (1.88 m), weight 97.523 kg (215 lb), SpO2 99 %.  Risk to Self: Suicidal Ideation: No Suicidal Intent: No Is patient at risk for suicide?: No Suicidal Plan?: No Access to Means: No What has been your use of drugs/alcohol within the last 12 months?: alcohol; binges How many times?: 0 Other Self Harm Risks: substance abuse Triggers for Past Attempts: None known Intentional Self Injurious Behavior: None Risk to Others: Homicidal Ideation: No Thoughts of Harm to Others: No Current Homicidal Intent: No Current Homicidal Plan: No Access to Homicidal Means: No Identified Victim: none History of harm to others?: No Assessment of Violence: On admission Violent  Behavior Description: none Does patient have access to weapons?: No Criminal Charges Pending?: No Does patient have a court date: No Prior Inpatient Therapy: Prior Inpatient Therapy:  [facility]) Prior Therapy Dates: unknown Prior Therapy Facilty/Provider(s): unknown Reason for Treatment: alcohol detox (unknown) Prior Outpatient Therapy: Prior Outpatient Therapy:  (unknown) Prior Therapy Dates: unknown Prior Therapy Facilty/Provider(s): unknown Reason for Treatment: detox Does patient have an ACCT team?: No Does patient have Intensive In-House Services?  : No Does patient have [facility] services? : No Does patient have P4CC services?: No  Current Facility-Administered Medications  Medication Dose Route Frequency  Provider Last Rate Last Dose  . ARIPiprazole (ABILIFY) tablet 5 mg  5 mg Oral Daily Gonzella Lex, MD      . busPIRone (BUSPAR) tablet 10 mg  10 mg Oral BID Gonzella Lex, MD      . chlordiazePOXIDE (LIBRIUM) capsule 50 mg  50 mg Oral STAT Gonzella Lex, MD      . LORazepam (ATIVAN) injection 0-4 mg  0-4 mg Intravenous 4 times per day Nena Polio, MD   1 mg at 03/01/15 0143  . LORazepam (ATIVAN) injection 0-4 mg  0-4 mg Intravenous Q12H Nena Polio, MD   0 mg at 02/28/15 2127  . LORazepam (ATIVAN) tablet 0-4 mg  0-4 mg Oral 4 times per day Nena Polio, MD   2 mg at 03/01/15 1127  . LORazepam (ATIVAN) tablet 0-4 mg  0-4 mg Oral Q12H Nena Polio, MD   1 mg at 03/01/15 0809  . LORazepam (ATIVAN) tablet 2 mg  2 mg Oral Once Lisa Roca, MD      . nicotine (NICODERM CQ - dosed in mg/24 hours) patch 21 mg  21 mg Transdermal Daily Nena Polio, MD      . propranolol ER (INDERAL LA) 24 hr capsule 60 mg  60 mg Oral Daily Gonzella Lex, MD      . thiamine (B-1) injection 100 mg  100 mg Intravenous Daily Nena Polio, MD   0 mg at 03/01/15 0056  . thiamine (B-1) injection 100 mg  100 mg Intravenous Daily Hinda Kehr, MD   100 mg at 02/28/15 2334  . thiamine (VITAMIN B-1) tablet 100 mg  100 mg Oral Daily Nena Polio, MD   0 mg at 03/01/15 0056  . thiamine (VITAMIN B-1) tablet 100 mg  100 mg Oral Daily Hinda Kehr, MD   100 mg at 03/01/15 1002  . traZODone (DESYREL) tablet 100 mg  100 mg Oral QHS Gonzella Lex, MD       No current outpatient prescriptions on file.    Musculoskeletal: Strength & Muscle Tone: spastic Gait & Station: unsteady Patient leans: N/A  Psychiatric Specialty Exam: Physical Exam  Constitutional: He appears well-developed and well-nourished. He appears distressed.  HENT:  Head: Normocephalic and atraumatic.  Eyes: Conjunctivae are normal. Pupils are equal, round, and reactive to light.  Neck: Normal range of motion.  Cardiovascular: Normal heart  sounds.   Respiratory: Effort normal.  GI: Soft.  Musculoskeletal: Normal range of motion.  Neurological: He is alert. Coordination abnormal.  Skin: Skin is warm. He is diaphoretic. There is erythema.  Psychiatric: Judgment and thought content normal. His mood appears anxious. His speech is delayed and slurred. He is slowed. Cognition and memory are impaired. He exhibits a depressed mood. He exhibits abnormal recent memory.    Review of Systems  Constitutional: Positive for malaise/fatigue.  HENT: Negative.  Eyes: Negative.   Respiratory: Negative.   Cardiovascular: Negative.   Gastrointestinal: Positive for nausea.  Musculoskeletal: Negative.   Skin: Negative.   Neurological: Positive for tremors, seizures and weakness.  Psychiatric/Behavioral: Positive for hallucinations, memory loss and substance abuse. Negative for depression and suicidal ideas. The patient is nervous/anxious and has insomnia.     Blood pressure 142/115, pulse 96, temperature 97.6 F (36.4 C), temperature source Oral, resp. rate 20, height 6' 2" (1.88 m), weight 97.523 kg (215 lb), SpO2 99 %.Body mass index is 27.59 kg/(m^2).  General Appearance: Disheveled  Eye Contact::  Minimal  Speech:  Garbled and Slow  Volume:  Decreased  Mood:  Dysphoric  Affect:  Constricted  Thought Process:  Circumstantial  Orientation:  Full (Time, Place, and Person)  Thought Content:  Hallucinations: Visual  Suicidal Thoughts:  No  Homicidal Thoughts:  no  Memory:  Immediate;   Good Recent;   Poor Remote;   Fair  Judgement:  Intact  Insight:  Fair  Psychomotor Activity:  Decreased  Concentration:  Poor  Recall:  Poor  Fund of Knowledge:Good  Language: Good  Akathisia:  No  Handed:  Right  AIMS (if indicated):     Assets:  Desire for Improvement Leisure Time Social Support Talents/Skills  ADL's:  Impaired  Cognition: Impaired,  Moderate  Sleep:      Medical Decision Making: Review of Psycho-Social Stressors (1),  Review or order clinical lab tests (1), Established Problem, Worsening (2), Review of Last Therapy Session (1), Review or order medicine tests (1) and Review of New Medication or Change in Dosage (2)  Treatment Plan Summary: Plan Patient presented to the emergency room very intoxicated. He is now showing signs of alcohol withdrawal. Blood pressure is up. Pulses of. Patient is shaking all over. He is having visual hallucinations and is a bit confused. He is able however to give a clear-cut history of recent heavy alcohol abuse. Patient requires hospital level treatment prevent seizures and delirium tremens. Not appropriate for psychiatry ward. I have restarted his Abilify propranolol trazodone and BuSpar and lamotrigine as best I can. We'll continue to follow. I have also put in orders for a 1 time dose of Librium. Case discussed with emergency room doctors. Patient will need to be referred to outpatient substance abuse treatment at the time of discharge.  Plan:  Patient does not meet criteria for psychiatric inpatient admission. Supportive therapy provided about ongoing stressors. Disposition: Advised admission to medical service. Psychiatry can follow up as needed  Alethia Berthold 03/01/2015 2:23 PM

## 2015-03-01 NOTE — H&P (Signed)
[name] NAME: Jason Hooper    MR#:  [id number]  DATE OF BIRTH:  03-25-1966  DATE OF ADMISSION:  02/28/2015  PRIMARY CARE PHYSICIAN: No primary care provider on file.   REQUESTING/REFERRING PHYSICIAN: Lisa Roca  CHIEF COMPLAINT: Follow: Intoxication requesting detox    Chief Complaint  Patient presents with  . Alcohol Problem  . Hallucinations    HISTORY OF PRESENT ILLNESS:  Jason Hooper  is a 49 y.o. male with a known history of EtOH abuse came to ER requesting detox. Patient has been drinking heavily since Christmas now drinking at least pint of liquor every day. Last drink was yesterday. He came today to ER requesting detox. Seen by Dr. Weber Cooks .pt i s going thru  withdrawal with some mild hallucinations and tremors,so psych   recommended medical admission for alcohol withdrawal. Patient says that he had one now detox before but that did not last. Denies any other complaints no chest pain no palpitations he says when he sleeps he is seeing somebody that doesn't exist. Denies any suicidal or homicidal ideation. Blood alcohol level was 400 in emergency room.  PAST MEDICAL HISTORY:  No past medical history on file. No past medical problems PAST SURGICAL HISTOIRY:  No past surgical history on file.  SOCIAL HISTORY:   History  Substance Use Topics  . Smoking status: Never Smoker   . Smokeless tobacco: Current User  . Alcohol Use: Yes   heavy alcohol abuse, last drink was yesterday. He drinks at least a pint of liquor every day.  FAMILY HISTORY:  No family history on file. No family history of hypertension or diabetes, alcohol abuse. DRUG ALLERGIES:  No Known Allergies  REVIEW OF SYSTEMS:  CONSTITUTIONAL: No fever, fatigue or weakness.  EYES: No blurred or double vision.  EARS, NOSE, AND THROAT: No tinnitus or ear pain.  RESPIRATORY: No cough, shortness of breath, wheezing or hemoptysis.  CARDIOVASCULAR: No  chest pain, orthopnea, edema.  GASTROINTESTINAL: No nausea, vomiting, diarrhea or abdominal pain.  GENITOURINARY: No dysuria, hematuria.  ENDOCRINE: No polyuria, nocturia,  HEMATOLOGY: No anemia, easy bruising or bleeding SKIN: No rash or lesion. MUSCULOSKELETAL: No joint pain or arthritis.   NEUROLOGIC: Very tremulous  PSYCHIATRY: No anxiety or depression.   MEDICATIONS AT HOME:   Prior to Admission medications   Not on File  Does not take any medicines on a regular basis.    VITAL SIGNS:  Blood pressure 142/115, pulse 96, temperature 97.6 F (36.4 C), temperature source Oral, resp. rate 20, height 6\' 2"  (1.88 m), weight 97.523 kg (215 lb), SpO2 99 %.  PHYSICAL EXAMINATION:  GENERAL:  49 y.o.-year-old male patient with generalized tremors and tremulousness secondary to alcohol withdrawal symptoms EYES: Pupils equal, round, reactive to light and accommodation. No scleral icterus. Extraocular muscles intact.  HEENT: Head atraumatic, normocephalic. Oropharynx and nasopharynx clear.  NECK:  Supple, no jugular venous distention. No thyroid enlargement, no tenderness.  LUNGS: Normal breath sounds bilaterally, no wheezing, rales,rhonchi or crepitation. No use of accessory muscles of respiration.  CARDIOVASCULAR: S1, S2 normal. No murmurs, rubs, or gallops.  ABDOMEN: Soft, nontender, nondistended. Bowel sounds present. No organomegaly or mass.  EXTREMITIES: No pedal edema, cyanosis, or clubbing.  NEUROLOGIC: Cranial nerves II through XII are intact. Muscle strength 5/5 in all extremities. Sensation intact. Gait not checked.  PSYCHIATRIC: The patient is alert and oriented x 3.  SKIN: No obvious rash, lesion, or ulcer.   LABORATORY  PANEL:   CBC  Recent Labs Lab 02/28/15 1932  WBC 8.6  HGB 17.9  HCT 53.6*  PLT 268   ------------------------------------------------------------------------------------------------------------------  Chemistries   Recent Labs Lab 02/28/15 1932   NA 143  K 3.8  CL 103  CO2 20*  GLUCOSE 168*  BUN 13  CREATININE 1.05  CALCIUM 9.6  AST 125*  ALT 96*  ALKPHOS 99  BILITOT 0.4   ------------------------------------------------------------------------------------------------------------------  Cardiac Enzymes No results for input(s): TROPONINI in the last 168 hours. ------------------------------------------------------------------------------------------------------------------  RADIOLOGY:  No results found.  EKG:  No orders found for this or any previous visit.  IMPRESSION AND PLAN:   1. Alcohol withdrawal symptoms with evidence of elevated blood pressure, tachycardia and generalized tremors. He also has some visual hallucinations on and off. So we will admit him to medicine start him on Ativan, IV thiamine and folic acid daily. nurse to monitor for ciwa protocol, 2.psych consult for ETOH abuse and detox. 3, Bipolar disorder, started on Lamictal. BuSpar, Abilify, Lamictal. Hypertension continue propranolol 60 MG by mouth daily   All the records are reviewed and case discussed with ED provider. Management plans discussed with the patient, family and they are in agreement.  CODE STATUS: full  TOTAL TIME TAKING CARE OF THIS PATIENT:  35 minutes.    Epifanio Lesches M.D on 03/01/2015 at 2:51 PM  Between 7am to 6pm - Pager - [phone]  After 6pm go to [email or web address] - password [email or web address] [facility]  [facility]  Office  [phone]  CC: Primary care physician; No primary care provider on file.

## 2015-03-01 NOTE — BH Assessment (Signed)
Assessment Note  Jason Hooper is an 49 y.o. male, who presents to the ED accompanied by his mother requesting assistance with alcohol detox; with stating that, he is having hallucinations; with unpredictable outbursts. Per mother, Jason Hooper--[phone], "he binge drinks; a lot when he drinks; he has hallucinations; he used to go to AA 20 years ago." BAC: 428.   Axis I: Alcohol Abuse Axis II: Deferred Axis III: No past medical history on file. Axis IV: other psychosocial or environmental problems and problems with access to health care services Axis V: 21-30 behavior considerably influenced by delusions or hallucinations OR serious impairment in judgment, communication OR inability to function in almost all areas  Past Medical History: No past medical history on file.  No past surgical history on file.  Family History: No family history on file.  Social History:  reports that he has never smoked. He uses smokeless tobacco. He reports that he drinks alcohol. He reports that he does not use illicit drugs.  Additional Social History:     CIWA: CIWA-Ar BP: 110/83 mmHg Pulse Rate: 100 Nausea and Vomiting: no nausea and no vomiting Tactile Disturbances: none Tremor: not visible, but can be felt fingertip to fingertip Auditory Disturbances: mild harshness or ability to frighten Paroxysmal Sweats: no sweat visible Visual Disturbances: not present Anxiety: mildly anxious Headache, Fullness in Head: moderate Agitation: normal activity Orientation and Clouding of Sensorium: oriented and can do serial additions CIWA-Ar Total: 7 COWS:    Allergies: No Known Allergies  Home Medications:  (Not in a hospital admission)  OB/GYN Status:  No LMP for male patient.  General Assessment Data Location of Assessment: [facility] ED TTS Assessment: In system Is this a Tele or Face-to-Face Assessment?: Face-to-Face Is this an Initial Assessment or a Re-assessment for this encounter?: Initial  Assessment Marital status: Single Maiden name: none Is patient pregnant?: No Pregnancy Status: No Living Arrangements: Parent Can pt return to current living arrangement?: Yes Admission Status: Involuntary Is patient capable of signing voluntary admission?: Yes Referral Source: Self/Family/Friend Insurance type: none  Medical Screening Exam ([place]) Medical Exam completed: Yes  Crisis Care Plan Living Arrangements: Parent Name of Psychiatrist: none Name of Therapist: none  Education Status Is patient currently in school?: No Current Grade: n/a Highest grade of school patient has completed: 12th Name of school: n/a Contact person: mother: Jason Hooper--[phone]  Risk to self with the past 6 months Suicidal Ideation: No Has patient been a risk to self within the past 6 months prior to admission? : No Suicidal Intent: No Has patient had any suicidal intent within the past 6 months prior to admission? : No Is patient at risk for suicide?: No Suicidal Plan?: No Has patient had any suicidal plan within the past 6 months prior to admission? : No Access to Means: No What has been your use of drugs/alcohol within the last 12 months?: alcohol; binges Previous Attempts/Gestures: No How many times?: 0 Other Self Harm Risks: substance abuse Triggers for Past Attempts: None known Intentional Self Injurious Behavior: None Family Suicide History: No Recent stressful life event(s): Conflict (Comment) Persecutory voices/beliefs?: No Depression: No Depression Symptoms: Loss of interest in usual pleasures Substance abuse history and/or treatment for substance abuse?:  (unknown) Suicide prevention information given to non-admitted patients: Not applicable  Risk to Others within the past 6 months Homicidal Ideation: No Does patient have any lifetime risk of violence toward others beyond the six months prior to admission? : No Thoughts of Harm to  Others: No Current  Homicidal Intent: No Current Homicidal Plan: No Access to Homicidal Means: No Identified Victim: none History of harm to others?: No Assessment of Violence: On admission Violent Behavior Description: none Does patient have access to weapons?: No Criminal Charges Pending?: No Does patient have a court date: No Is patient on probation?: No  Psychosis Hallucinations: Visual Delusions: None noted  Mental Status Report Appearance/Hygiene: In scrubs, Unremarkable Eye Contact: Poor Motor Activity: Unremarkable Speech: Slurred Level of Consciousness: Sleeping Mood: Irritable Affect: Irritable Anxiety Level: Minimal Thought Processes: Irrelevant Judgement: Impaired Orientation: Person Obsessive Compulsive Thoughts/Behaviors: None  Cognitive Functioning Concentration: Decreased Memory: Recent Impaired, Remote Impaired Insight: Poor Impulse Control: Fair Appetite: Good Weight Loss: 0 Weight Gain: 0 Sleep: Unable to Assess Total Hours of Sleep: 0 Vegetative Symptoms: Unable to Assess  ADLScreening [facility] Assessment Services) Patient's cognitive ability adequate to safely complete daily activities?:  (currently intoxicated) Patient able to express need for assistance with ADLs?:  (intoxicated) Independently performs ADLs?: Yes (appropriate for developmental age)  Prior Inpatient Therapy Prior Inpatient Therapy:  [facility]) Prior Therapy Dates: unknown Prior Therapy Facilty/Provider(s): unknown Reason for Treatment: alcohol detox (unknown)  Prior Outpatient Therapy Prior Outpatient Therapy:  (unknown) Prior Therapy Dates: unknown Prior Therapy Facilty/Provider(s): unknown Reason for Treatment: detox Does patient have an ACCT team?: No Does patient have Intensive In-House Services?  : No Does patient have [facility] services? : No Does patient have P4CC services?: No  ADL Screening (condition at time of admission) Patient's cognitive ability adequate to safely complete daily  activities?:  (currently intoxicated) Patient able to express need for assistance with ADLs?:  (intoxicated) Independently performs ADLs?: Yes (appropriate for developmental age)       Abuse/Neglect Assessment (Assessment to be complete while patient is alone) Physical Abuse: Denies Verbal Abuse: Denies Sexual Abuse: Denies Exploitation of patient/patient's resources: Denies Self-Neglect: Denies Values / Beliefs Cultural Requests During Hospitalization: None Spiritual Requests During Hospitalization: None Consults Spiritual Care Consult Needed: No Social Work Consult Needed: No      Additional Information 1:1 In Past 12 Months?: No CIRT Risk: No Elopement Risk: No Does patient have medical clearance?: Yes  Child/Adolescent Assessment Running Away Risk: Denies Bed-Wetting: Denies Destruction of Property: Denies Cruelty to Animals: Denies Stealing: Denies Rebellious/Defies Authority: Denies Satanic Involvement: Denies Science writer: Denies Problems at [employer]: Denies Gang Involvement: Denies  Disposition:  Disposition Initial Assessment Completed for this Encounter: Yes Disposition of Patient: Referred to (psych MD to see) Patient referred to: Other (Comment) (psych MD to see)  On Site Evaluation by:   Reviewed with Physician:    Maris Berger 03/01/2015 4:47 AM

## 2015-03-01 NOTE — ED Notes (Signed)
BEHAVIORAL HEALTH ROUNDING Patient sleeping: No. Patient alert and oriented: yes Behavior appropriate: Yes.   Nutrition and fluids offered: Yes  Toileting and hygiene offered: Yes  Sitter present: Erlene Quan ED [employer] present: Yes [employer]

## 2015-03-01 NOTE — ED Provider Notes (Signed)
-----------------------------------------   1:23 AM on 03/01/2015 -----------------------------------------  Patient sleeping, has not urinated yet. Second liter IV normal saline hanging. Will continue to monitor.  ----------------------------------------- 6:00 AM on 03/01/2015 -----------------------------------------  Tachycardia improved. Patient currently resting in no acute distress. Awaiting psychiatry consult this morning.  Paulette Blanch, MD 03/01/15 [phone]

## 2015-03-01 NOTE — ED Notes (Signed)
ED BHU [place] Is the patient under IVC or is there intent for IVC: Yes.   Is the patient medically cleared: Yes.   Is there vacancy in the ED BHU: Yes.   Is the population mix appropriate for patient: Yes.   Is the patient awaiting placement in inpatient or outpatient setting: Yes.   Has the patient had a psychiatric consult: no Survey of unit performed for contraband, proper placement and condition of furniture, tampering with fixtures in bathroom, shower, and each patient room: Yes.  ; Findings:  APPEARANCE/BEHAVIOR calm, cooperative and adequate rapport can be established NEURO ASSESSMENT Orientation: time, place and person Hallucinations: noNone noted (Hallucinations) Speech: Normal Gait: normal RESPIRATORY ASSESSMENT Normal expansion.  Clear to auscultation.  No rales, rhonchi, or wheezing. CARDIOVASCULAR ASSESSMENT regular rate and rhythm, S1, S2 normal, no murmur, click, rub or gallop GASTROINTESTINAL ASSESSMENT soft, nontender, BS WNL, no r/g EXTREMITIES normal strength, tone, and muscle mass PLAN OF CARE Provide calm/safe environment. Vital signs assessed twice daily. ED BHU Assessment once each 12-hour shift. Collaborate with intake RN daily or as condition indicates. Assure the ED provider has rounded once each shift. Provide and encourage hygiene. Provide redirection as needed. Assess for escalating behavior; address immediately and inform ED provider.  Assess family dynamic and appropriateness for visitation as needed: Yes.  ; If necessary, describe findings:  Educate the patient/family about BHU procedures/visitation: Yes.  ; If necessary, describe findings:

## 2015-03-01 NOTE — ED Notes (Signed)
ETOH high critical called by lab of 428 from lab draw at 1932. Dr Beather Arbour notified of result and delay of result being called.  Dr Beather Arbour verbally ordered 1000 ml NS at 999 ml/hr x1 dose

## 2015-03-02 DIAGNOSIS — F10931 Alcohol use, unspecified with withdrawal delirium: Secondary | ICD-10-CM

## 2015-03-02 DIAGNOSIS — F10231 Alcohol dependence with withdrawal delirium: Secondary | ICD-10-CM

## 2015-03-02 LAB — CBC
HCT: 44.8 % (ref 40.0–52.0)
Hemoglobin: 15.4 g/dL (ref 13.0–18.0)
MCH: 36.1 pg — AB (ref 26.0–34.0)
MCHC: 34.3 g/dL (ref 32.0–36.0)
MCV: 105.2 fL — ABNORMAL HIGH (ref 80.0–100.0)
Platelets: 183 10*3/uL (ref 150–440)
RBC: 4.26 MIL/uL — ABNORMAL LOW (ref 4.40–5.90)
RDW: 14.5 % (ref 11.5–14.5)
WBC: 5.8 10*3/uL (ref 3.8–10.6)

## 2015-03-02 LAB — BASIC METABOLIC PANEL
Anion gap: 6 (ref 5–15)
BUN: 11 mg/dL (ref 6–20)
CALCIUM: 9 mg/dL (ref 8.9–10.3)
CHLORIDE: 102 mmol/L (ref 101–111)
CO2: 29 mmol/L (ref 22–32)
CREATININE: 0.78 mg/dL (ref 0.61–1.24)
GFR calc Af Amer: >60 mL/min (ref >60)
GFR calc non Af Amer: >60 mL/min (ref >60)
GLUCOSE: 104 mg/dL — AB (ref 65–99)
POTASSIUM: 3.6 mmol/L (ref 3.5–5.1)
Sodium: 137 mmol/L (ref 135–145)

## 2015-03-02 MED ORDER — CHLORDIAZEPOXIDE HCL 25 MG PO CAPS
50.0000 mg | ORAL_CAPSULE | ORAL | Status: AC
  Administered 2015-03-02: 50 mg via ORAL
  Filled 2015-03-02: qty 2

## 2015-03-02 MED ORDER — NICOTINE POLACRILEX 2 MG MT GUM
2.0000 mg | CHEWING_GUM | Freq: Four times a day (QID) | OROMUCOSAL | Status: DC | PRN
  Filled 2015-03-02: qty 1

## 2015-03-02 MED ORDER — SALINE SPRAY 0.65 % NA SOLN
1.0000 | NASAL | Status: DC | PRN
  Filled 2015-03-02: qty 44

## 2015-03-02 MED ORDER — LORAZEPAM 2 MG PO TABS
2.0000 mg | ORAL_TABLET | Freq: Four times a day (QID) | ORAL | Status: DC
  Administered 2015-03-02 – 2015-03-05 (×9): 2 mg via ORAL
  Filled 2015-03-02 (×9): qty 1

## 2015-03-02 MED ORDER — TRIAMCINOLONE ACETONIDE 55 MCG/ACT NA AERO
1.0000 | INHALATION_SPRAY | Freq: Two times a day (BID) | NASAL | Status: DC
  Filled 2015-03-02: qty 10.8

## 2015-03-02 MED ORDER — DEXMEDETOMIDINE HCL IN NACL 400 MCG/100ML IV SOLN
0.4000 ug/kg/h | INTRAVENOUS | Status: DC
  Administered 2015-03-02: 1.2 ug/kg/h via INTRAVENOUS
  Administered 2015-03-02: 1 ug/kg/h via INTRAVENOUS
  Administered 2015-03-03: 1.2 ug/kg/h via INTRAVENOUS
  Administered 2015-03-03: 1 ug/kg/h via INTRAVENOUS
  Administered 2015-03-03 (×3): 1.2 ug/kg/h via INTRAVENOUS
  Administered 2015-03-04: 1 ug/kg/h via INTRAVENOUS
  Administered 2015-03-04: 0.8 ug/kg/h via INTRAVENOUS
  Filled 2015-03-02 (×10): qty 100

## 2015-03-02 MED ORDER — PANTOPRAZOLE SODIUM 40 MG PO TBEC
40.0000 mg | DELAYED_RELEASE_TABLET | Freq: Every day | ORAL | Status: DC
  Administered 2015-03-04: 40 mg via ORAL
  Filled 2015-03-02: qty 1

## 2015-03-02 MED ORDER — HALOPERIDOL LACTATE 5 MG/ML IJ SOLN
1.0000 mg | Freq: Four times a day (QID) | INTRAMUSCULAR | Status: DC | PRN
  Administered 2015-03-02: 5 mg via INTRAVENOUS
  Filled 2015-03-02: qty 1

## 2015-03-02 MED ORDER — HYDRALAZINE HCL 20 MG/ML IJ SOLN
10.0000 mg | Freq: Four times a day (QID) | INTRAMUSCULAR | Status: DC | PRN
  Administered 2015-03-03: 10 mg via INTRAVENOUS
  Filled 2015-03-02: qty 1

## 2015-03-02 MED ORDER — FLUTICASONE PROPIONATE 50 MCG/ACT NA SUSP
2.0000 | Freq: Every day | NASAL | Status: DC
  Administered 2015-03-04 – 2015-03-05 (×2): 2 via NASAL
  Filled 2015-03-02: qty 16

## 2015-03-02 MED ORDER — ZIPRASIDONE MESYLATE 20 MG IM SOLR
10.0000 mg | INTRAMUSCULAR | Status: AC
  Administered 2015-03-02: 20 mg via INTRAMUSCULAR
  Filled 2015-03-02: qty 20

## 2015-03-02 MED ORDER — QUETIAPINE FUMARATE 25 MG PO TABS
50.0000 mg | ORAL_TABLET | Freq: Three times a day (TID) | ORAL | Status: DC
  Administered 2015-03-02 – 2015-03-05 (×7): 50 mg via ORAL
  Filled 2015-03-02 (×2): qty 2
  Filled 2015-03-02: qty 1
  Filled 2015-03-02: qty 2
  Filled 2015-03-02: qty 1
  Filled 2015-03-02: qty 2
  Filled 2015-03-02 (×3): qty 1
  Filled 2015-03-02: qty 2
  Filled 2015-03-02 (×3): qty 1

## 2015-03-02 NOTE — Progress Notes (Signed)
Patient just transferred to ICU 114. Report was given to [facility].

## 2015-03-02 NOTE — Progress Notes (Signed)
Patient's BP=153/115 and there's no order for BP med at this time. Dr Marcille Blanco notified and received new order to move the 10:00 am Propanolol to give at this time. No c/o pain or any s/s of discomfort noted. No withdrawal symptoms while the patient is on CIWA. Sitter at bedside.

## 2015-03-02 NOTE — Progress Notes (Signed)
[place] [facility] [place] NAME: Jason Hooper    MR#:  [id number]  DATE OF BIRTH:  [birth year]  SUBJECTIVE:  CHIEF COMPLAINT:   Chief Complaint  Patient presents with  . Alcohol Problem  . Hallucinations   Admitted for detox. Recent extensive alcohol abuse. Still complains of tremors and unsteady gait. Sitter at bedside. Appreciate psychiatric consult.  REVIEW OF SYSTEMS:  Review of Systems  Constitutional: Negative for fever and chills.  Respiratory: Negative for cough, shortness of breath and wheezing.   Cardiovascular: Negative for chest pain and palpitations.  Gastrointestinal: Negative for nausea, vomiting, abdominal pain, diarrhea and constipation.  Genitourinary: Negative for dysuria.  Neurological: Positive for tremors. Negative for dizziness, seizures and headaches.       Confusion at times. No hallucinations.    DRUG ALLERGIES:  No Known Allergies  VITALS:  Blood pressure 143/112, pulse 87, temperature 97.6 F (36.4 C), temperature source Oral, resp. rate 20, height 6\' 2"  (1.88 m), weight 84.502 kg (186 lb 4.7 oz), SpO2 100 %.  PHYSICAL EXAMINATION:  Physical Exam  GENERAL:  49 y.o.-year-old patient lying in the bed with no acute distress.  EYES: Pupils equal, round, reactive to light and accommodation. No scleral icterus. Extraocular muscles intact.  HEENT: Head atraumatic, normocephalic. Oropharynx and nasopharynx clear.  NECK:  Supple, no jugular venous distention. No thyroid enlargement, no tenderness.  LUNGS: Normal breath sounds bilaterally, no wheezing, rales,rhonchi or crepitation. No use of accessory muscles of respiration.  CARDIOVASCULAR: S1, S2 normal. No murmurs, rubs, or gallops.  ABDOMEN: Soft, nontender, nondistended. Bowel sounds present. No organomegaly or mass.  EXTREMITIES: No pedal edema, cyanosis, or clubbing.  NEUROLOGIC: Cranial nerves II through XII are intact. Muscle strength 5/5 in all  extremities. Sensation intact. Gait not checked. Significant tremors present in both upper extremities. PSYCHIATRIC: The patient is alert and oriented x 3.  SKIN: No obvious rash, lesion, or ulcer.    LABORATORY PANEL:   CBC  Recent Labs Lab 03/02/15 0429  WBC 5.8  HGB 15.4  HCT 44.8  PLT 183   ------------------------------------------------------------------------------------------------------------------  Chemistries   Recent Labs Lab 02/28/15 1932  03/02/15 0429  NA 143  --  137  K 3.8  --  3.6  CL 103  --  102  CO2 20*  --  29  GLUCOSE 168*  --  104*  BUN 13  --  11  CREATININE 1.05  < > 0.78  CALCIUM 9.6  --  9.0  AST 125*  --   --   ALT 96*  --   --   ALKPHOS 99  --   --   BILITOT 0.4  --   --   < > = values in this interval not displayed. ------------------------------------------------------------------------------------------------------------------  Cardiac Enzymes No results for input(s): TROPONINI in the last 168 hours. ------------------------------------------------------------------------------------------------------------------  RADIOLOGY:  No results found.  EKG:  No orders found for this or any previous visit.  ASSESSMENT AND PLAN:   49 year old male with past medical history significant for alcohol abuse, hypertension presents to the hospital requesting for alcohol detox.   #1  alcohol withdrawal-patient was intoxicated on admission with elevated alcohol level. High risk for delirium tremens. So far only tremors without any hallucinations. Appreciate psych consult. Did receive 1 dose of Librium. Currently on CIWA protocol with IV Ativan when necessary. Continue sitter, encourage ambulation. Possible discharge in 1-2 days with outpatient substance abuse follow-up.  #2 hypertension- continue propranolol. Added  IV hydralazine when necessary. Likely elevated from withdrawal symptoms.  # 3 Bipolar disorder- cont lamictal, buspar, abilify,  lamictal.  #4 DVT prophylaxis- Lovenox   All the records are reviewed and case discussed with Care Management/Social Workerr. Management plans discussed with the patient, family and they are in agreement.  CODE STATUS: Full code  TOTAL TIME TAKING CARE OF THIS PATIENT: 36 minutes.   POSSIBLE D/C IN 2 DAYS, DEPENDING ON CLINICAL CONDITION.   Gladstone Lighter M.D on 03/02/2015 at 11:23 AM  Between 7am to 6pm - Pager - [phone]  After 6pm go to [email or web address] - password [email or web address]  [facility]  Office  [phone]  CC: Primary care physician; No primary care provider on file.

## 2015-03-02 NOTE — Progress Notes (Signed)
Mother came to visit and gave patient can of dip, Pt was told he could not have that but refused to give to Nurse. Security called and Dip was put in pts bin,he can have it at discharge.

## 2015-03-02 NOTE — Progress Notes (Signed)
Code 300 called, pt became agitated and tried to leave. He was calmed down and walked back to room. Dr. Michail Sermon and nursing supervisor notified. New sitter in room now

## 2015-03-02 NOTE — Consult Note (Signed)
[place] [facility] Consult   Reason for Consult:  Consult for this patient with alcohol abuse as well as a past diagnosis of bipolar disorder Referring Physician:  Gouru Patient Identification: Jason Hooper MRN:  [id number] Principal Diagnosis: Alcohol withdrawal delirium Diagnosis:   Patient Active Problem List   Diagnosis Date Noted  . Alcohol withdrawal delirium [F10.231] 03/02/2015  . Alcohol dependence with intoxication [F10.229] 03/01/2015  . Alcohol withdrawal [F10.239]   . Bipolar affective disorder, currently manic, severe, with psychotic features [F31.2]     Total Time spent with patient: 1 hour  Subjective:   Jason DEFINO is a 49 y.o. male patient admitted with patient was admitted through the emergency room for treatment of complicated alcohol withdrawal probably involving alcohol withdrawal delirium.Marland Kitchen  HPI:  Information obtained from the patient and the chart. Labs reviewed vitals reviewed. Patient presented to the emergency room yesterday or the day before with a blood alcohol level initially over 400. When he initially presented apparently it was unclear what the history was and he was simply diagnosed as bipolar disorder. It is clear however that his major problem was his recent heavy abuse of alcohol. Patient had told me that he was requesting detox. He says his mood is feeling irritable and down but he denies that he had been having known psychotic symptoms. He says he had been compliant with psychiatric medicine prescribed by his outpatient doctors including Abilify. Patient admitted that he had been drinking heavily and the amount probably been increasing. Last drink was probably a few hours before he presented to the emergency room. After my discussion with Dr. Reita Cliche he was admitted to the medical service. Today the patient presents as confused. He tells me that he wants to take a walk where the other workers are so that he can smoke a cigarette. He got agitated this  morning when someone tried to do an EKG because he didn't understand what was going on.  Patient has a past history of complicated alcohol withdrawal with self-reported seizures and delirium tremens. He's been diagnosed with bipolar disorder. It's unclear to me what his symptomatology pattern is when he is sober since he doesn't have very much sobriety. Has been treated with a variety of medications including Seroquel and most recently Abilify. He thinks the Seroquel used to help but it stopped. Denies any history of suicide attempts. He has had psychiatric hospitalizations before.  Social history is that he lives with his mother. Has been impaired recently and his ability to work. Has children and other extended family but isn't in very close touch with him.  Medical history high blood pressure and the alcohol abuse  Substance abuse history long history of alcohol abuse. He minimizes the abuse of other drugs. History of DTs and seizures both. He seems to have had minimal experience with going to outpatient substance abuse treatment. He has been to the alcohol and drug abuse treatment center in [place] at least once.  Outpatient medicine Lamictal 25 mg a day Abilify 5 mg a day trazodone 100 mg at night   HPI Elements:   Quality:  Agitation and confusion tremor. Severity:  Fairly severe. Timing:  Started after he stopped drinking and is getting worse. Duration:  Present for at least 2 days now and continuing. Context:  Alcohol withdrawal.  Past Medical History: History reviewed. No pertinent past medical history. History reviewed. No pertinent past surgical history. Family History: History reviewed. No pertinent family history. Social History:  History  Alcohol Use  . Yes     History  Drug Use No    History   Social History  . Marital Status: Single    Spouse Name: N/A  . Number of Children: N/A  . Years of Education: N/A   Social History Main Topics  . Smoking status: Never  Smoker   . Smokeless tobacco: Never Used  . Alcohol Use: Yes  . Drug Use: No  . Sexual Activity: Not on file   Other Topics Concern  . None   Social History Narrative   Additional Social History:                          Allergies:  No Known Allergies  Labs:  Results for orders placed or performed during the hospital encounter of 02/28/15 (from the past 48 hour(s))  Acetaminophen level     Status: Abnormal   Collection Time: 02/28/15  7:32 PM  Result Value Ref Range   Acetaminophen (Tylenol), Serum <10 (L) 10 - 30 ug/mL    Comment:        THERAPEUTIC CONCENTRATIONS VARY SIGNIFICANTLY. A RANGE OF 10-30 ug/mL MAY BE AN EFFECTIVE CONCENTRATION FOR MANY PATIENTS. HOWEVER, SOME ARE BEST TREATED AT CONCENTRATIONS OUTSIDE THIS RANGE. ACETAMINOPHEN CONCENTRATIONS >150 ug/mL AT 4 HOURS AFTER INGESTION AND >50 ug/mL AT 12 HOURS AFTER INGESTION ARE OFTEN ASSOCIATED WITH TOXIC REACTIONS.   CBC     Status: Abnormal   Collection Time: 02/28/15  7:32 PM  Result Value Ref Range   WBC 8.6 3.8 - 10.6 K/uL   RBC 5.04 4.40 - 5.90 MIL/uL   Hemoglobin 17.9 13.0 - 18.0 g/dL   HCT 53.6 (H) 40.0 - 52.0 %   MCV 106.2 (H) 80.0 - 100.0 fL   MCH 35.6 (H) 26.0 - 34.0 pg   MCHC 33.5 32.0 - 36.0 g/dL   RDW 14.6 (H) 11.5 - 14.5 %   Platelets 268 150 - 440 K/uL  Comprehensive metabolic panel     Status: Abnormal   Collection Time: 02/28/15  7:32 PM  Result Value Ref Range   Sodium 143 135 - 145 mmol/L   Potassium 3.8 3.5 - 5.1 mmol/L   Chloride 103 101 - 111 mmol/L   CO2 20 (L) 22 - 32 mmol/L   Glucose, Bld 168 (H) 65 - 99 mg/dL   BUN 13 6 - 20 mg/dL   Creatinine, Ser 1.05 0.61 - 1.24 mg/dL   Calcium 9.6 8.9 - 10.3 mg/dL   Total Protein 8.2 (H) 6.5 - 8.1 g/dL   Albumin 5.0 3.5 - 5.0 g/dL   AST 125 (H) 15 - 41 U/L   ALT 96 (H) 17 - 63 U/L   Alkaline Phosphatase 99 38 - 126 U/L   Total Bilirubin 0.4 0.3 - 1.2 mg/dL   GFR calc non Af Amer >60 >60 mL/min   GFR calc Af Amer >60  >60 mL/min    Comment: (NOTE) The eGFR has been calculated using the CKD EPI equation. This calculation has not been validated in all clinical situations. eGFR's persistently <60 mL/min signify possible Chronic Kidney Disease.    Anion gap 20 (H) 5 - 15  Ethanol (ETOH)     Status: Abnormal   Collection Time: 02/28/15  7:32 PM  Result Value Ref Range   Alcohol, Ethyl (B) 428 (HH) <5 mg/dL    Comment: CRITICAL RESULT CALLED TO, READ BACK BY AND VERIFIED WITH BETH BRANDEL @0102  03/01/15  BY AJO        LOWEST DETECTABLE LIMIT FOR SERUM ALCOHOL IS 11 mg/dL FOR MEDICAL PURPOSES ONLY   Salicylate level     Status: None   Collection Time: 02/28/15  7:32 PM  Result Value Ref Range   Salicylate Lvl <6.7 2.8 - 30.0 mg/dL  Lithium level     Status: Abnormal   Collection Time: 02/28/15  7:32 PM  Result Value Ref Range   Lithium Lvl <0.06 (L) 0.60 - 1.20 mmol/L  Urinalysis complete, with microscopic [facility])     Status: Abnormal   Collection Time: 03/01/15  3:20 AM  Result Value Ref Range   Color, Urine YELLOW (A) YELLOW   APPearance CLEAR (A) CLEAR   Glucose, UA NEGATIVE NEGATIVE mg/dL   Bilirubin Urine NEGATIVE NEGATIVE   Ketones, ur TRACE (A) NEGATIVE mg/dL   Specific Gravity, Urine 1.013 1.005 - 1.030   Hgb urine dipstick NEGATIVE NEGATIVE   pH 5.0 5.0 - 8.0   Protein, ur NEGATIVE NEGATIVE mg/dL   Nitrite NEGATIVE NEGATIVE   Leukocytes, UA NEGATIVE NEGATIVE   RBC / HPF 0-5 0 - 5 RBC/hpf   WBC, UA 0-5 0 - 5 WBC/hpf   Bacteria, UA RARE (A) NONE SEEN   Squamous Epithelial / LPF NONE SEEN NONE SEEN   Mucous PRESENT   Urine Drug Screen, [facility])     Status: Abnormal   Collection Time: 03/01/15  3:28 AM  Result Value Ref Range   Tricyclic, Ur Screen POSITIVE (A) NONE DETECTED   Amphetamines, Ur Screen NONE DETECTED NONE DETECTED   MDMA (Ecstasy)Ur Screen NONE DETECTED NONE DETECTED   Cocaine Metabolite,Ur [place] NONE DETECTED NONE DETECTED   Opiate, Ur Screen NONE DETECTED NONE  DETECTED   Phencyclidine (PCP) Ur S NONE DETECTED NONE DETECTED   Cannabinoid 50 Ng, Ur [place] NONE DETECTED NONE DETECTED   Barbiturates, Ur Screen NONE DETECTED NONE DETECTED   Benzodiazepine, Ur Scrn POSITIVE (A) NONE DETECTED   Methadone Scn, Ur NONE DETECTED NONE DETECTED    Comment: (NOTE) 289  Tricyclics, urine               Cutoff 1000 ng/mL 200  Amphetamines, urine             Cutoff 1000 ng/mL 300  MDMA (Ecstasy), urine           Cutoff 500 ng/mL 400  Cocaine Metabolite, urine       Cutoff 300 ng/mL 500  Opiate, urine                   Cutoff 300 ng/mL 600  Phencyclidine (PCP), urine      Cutoff 25 ng/mL 700  Cannabinoid, urine              Cutoff 50 ng/mL 800  Barbiturates, urine             Cutoff 200 ng/mL 900  Benzodiazepine, urine           Cutoff 200 ng/mL 1000 Methadone, urine                Cutoff 300 ng/mL 1100 1200 The urine drug screen provides only a preliminary, unconfirmed 1300 analytical test result and should not be used for non-medical 1400 purposes. Clinical consideration and professional judgment should 1500 be applied to any positive drug screen result due to possible 1600 interfering substances. A more specific alternate chemical method 1700 must be used in order to obtain a confirmed  analytical result.  1800 Gas chromato graphy / mass spectrometry (GC/MS) is the preferred 1900 confirmatory method.   CBC     Status: Abnormal   Collection Time: 03/01/15  4:47 PM  Result Value Ref Range   WBC 6.9 3.8 - 10.6 K/uL   RBC 4.35 (L) 4.40 - 5.90 MIL/uL   Hemoglobin 15.5 13.0 - 18.0 g/dL   HCT 45.7 40.0 - 52.0 %   MCV 105.0 (H) 80.0 - 100.0 fL   MCH 35.6 (H) 26.0 - 34.0 pg   MCHC 33.9 32.0 - 36.0 g/dL   RDW 14.3 11.5 - 14.5 %   Platelets 194 150 - 440 K/uL  Creatinine, serum     Status: None   Collection Time: 03/01/15  4:47 PM  Result Value Ref Range   Creatinine, Ser 0.80 0.61 - 1.24 mg/dL   GFR calc non Af Amer >60 >60 mL/min   GFR calc Af Amer >60 >60  mL/min    Comment: (NOTE) The eGFR has been calculated using the CKD EPI equation. This calculation has not been validated in all clinical situations. eGFR's persistently <60 mL/min signify possible Chronic Kidney Disease.   CBC     Status: Abnormal   Collection Time: 03/02/15  4:29 AM  Result Value Ref Range   WBC 5.8 3.8 - 10.6 K/uL   RBC 4.26 (L) 4.40 - 5.90 MIL/uL   Hemoglobin 15.4 13.0 - 18.0 g/dL   HCT 44.8 40.0 - 52.0 %   MCV 105.2 (H) 80.0 - 100.0 fL   MCH 36.1 (H) 26.0 - 34.0 pg   MCHC 34.3 32.0 - 36.0 g/dL   RDW 14.5 11.5 - 14.5 %   Platelets 183 150 - 440 K/uL  Basic metabolic panel     Status: Abnormal   Collection Time: 03/02/15  4:29 AM  Result Value Ref Range   Sodium 137 135 - 145 mmol/L   Potassium 3.6 3.5 - 5.1 mmol/L   Chloride 102 101 - 111 mmol/L   CO2 29 22 - 32 mmol/L   Glucose, Bld 104 (H) 65 - 99 mg/dL   BUN 11 6 - 20 mg/dL   Creatinine, Ser 0.78 0.61 - 1.24 mg/dL   Calcium 9.0 8.9 - 10.3 mg/dL   GFR calc non Af Amer >60 >60 mL/min   GFR calc Af Amer >60 >60 mL/min    Comment: (NOTE) The eGFR has been calculated using the CKD EPI equation. This calculation has not been validated in all clinical situations. eGFR's persistently <60 mL/min signify possible Chronic Kidney Disease.    Anion gap 6 5 - 15    Vitals: Blood pressure 140/99, pulse 87, temperature 97.6 F (36.4 C), temperature source Oral, resp. rate 20, height 6' 2"  (1.88 m), weight 84.502 kg (186 lb 4.7 oz), SpO2 100 %.  Risk to Self: Suicidal Ideation: No Suicidal Intent: No Is patient at risk for suicide?: No Suicidal Plan?: No Access to Means: No What has been your use of drugs/alcohol within the last 12 months?: alcohol; binges How many times?: 0 Other Self Harm Risks: substance abuse Triggers for Past Attempts: None known Intentional Self Injurious Behavior: None Risk to Others: Homicidal Ideation: No Thoughts of Harm to Others: No Current Homicidal Intent: No Current  Homicidal Plan: No Access to Homicidal Means: No Identified Victim: none History of harm to others?: No Assessment of Violence: On admission Violent Behavior Description: none Does patient have access to weapons?: No Criminal Charges Pending?: No Does patient have  a court date: No Prior Inpatient Therapy: Prior Inpatient Therapy:  [facility]) Prior Therapy Dates: unknown Prior Therapy Facilty/Provider(s): unknown Reason for Treatment: alcohol detox (unknown) Prior Outpatient Therapy: Prior Outpatient Therapy:  (unknown) Prior Therapy Dates: unknown Prior Therapy Facilty/Provider(s): unknown Reason for Treatment: detox Does patient have an ACCT team?: No Does patient have Intensive In-House Services?  : No Does patient have [facility] services? : No Does patient have P4CC services?: No  Current Facility-Administered Medications  Medication Dose Route Frequency Provider Last Rate Last Dose  . acetaminophen (TYLENOL) tablet 650 mg  650 mg Oral Q6H PRN Epifanio Lesches, MD       Or  . acetaminophen (TYLENOL) suppository 650 mg  650 mg Rectal Q6H PRN Epifanio Lesches, MD      . busPIRone (BUSPAR) tablet 10 mg  10 mg Oral BID Gonzella Lex, MD   10 mg at 03/02/15 0881  . chlordiazePOXIDE (LIBRIUM) capsule 50 mg  50 mg Oral STAT Gonzella Lex, MD      . enoxaparin (LOVENOX) injection 40 mg  40 mg Subcutaneous Q24H Epifanio Lesches, MD   40 mg at 03/01/15 2106  . fluticasone (FLONASE) 50 MCG/ACT nasal spray 2 spray  2 spray Each Nare Daily Gladstone Lighter, MD      . hydrALAZINE (APRESOLINE) injection 10 mg  10 mg Intravenous Q6H PRN Gladstone Lighter, MD      . lamoTRIgine (LAMICTAL) tablet 25 mg  25 mg Oral Daily Gonzella Lex, MD   25 mg at 03/02/15 0915  . LORazepam (ATIVAN) injection 0-4 mg  0-4 mg Intravenous Q4H PRN Epifanio Lesches, MD      . LORazepam (ATIVAN) tablet 0-4 mg  0-4 mg Oral Q4H PRN Epifanio Lesches, MD      . LORazepam (ATIVAN) tablet 2 mg  2 mg Oral 4 times  per day Gonzella Lex, MD   2 mg at 03/02/15 1437  . nicotine (NICODERM CQ - dosed in mg/24 hours) patch 21 mg  21 mg Transdermal Daily Nena Polio, MD   21 mg at 03/02/15 1031  . nicotine polacrilex (NICORETTE) gum 2 mg  2 mg Oral Q6H PRN Gonzella Lex, MD      . pantoprazole (PROTONIX) EC tablet 40 mg  40 mg Oral Daily Gladstone Lighter, MD   40 mg at 03/02/15 1241  . propranolol ER (INDERAL LA) 24 hr capsule 60 mg  60 mg Oral Daily Gonzella Lex, MD   60 mg at 03/02/15 0546  . QUEtiapine (SEROQUEL) tablet 50 mg  50 mg Oral TID Gonzella Lex, MD      . thiamine (VITAMIN B-1) tablet 100 mg  100 mg Oral Daily Hinda Kehr, MD   100 mg at 03/02/15 0915  . traZODone (DESYREL) tablet 100 mg  100 mg Oral QHS Gonzella Lex, MD   100 mg at 03/01/15 2105    Musculoskeletal: Strength & Muscle Tone: increased Gait & Station: broad based Patient leans: N/A  Psychiatric Specialty Exam: Physical Exam  Constitutional: He appears well-developed and well-nourished.  HENT:  Head: Normocephalic and atraumatic.  Eyes: Conjunctivae are normal. Pupils are equal, round, and reactive to light.  Neck: Normal range of motion.  Cardiovascular: Normal heart sounds.   Respiratory: Effort normal.  GI: Soft.  Musculoskeletal: Normal range of motion.  Neurological: He is alert. Coordination abnormal.  Skin: Skin is warm and dry.  Psychiatric: His mood appears anxious. His affect is labile and inappropriate. His speech  is slurred. He is agitated. Thought content is paranoid. Cognition and memory are impaired. He expresses impulsivity and inappropriate judgment. He expresses no homicidal and no suicidal ideation. He exhibits abnormal recent memory and abnormal remote memory.    Review of Systems  Constitutional: Positive for malaise/fatigue and diaphoresis.  Eyes: Negative.   Respiratory: Negative.   Cardiovascular: Positive for palpitations.  Gastrointestinal: Positive for nausea.  Musculoskeletal:  Positive for myalgias.  Skin: Negative.   Neurological: Positive for dizziness, tremors, weakness and headaches.  Psychiatric/Behavioral: Positive for memory loss and substance abuse. Negative for depression, suicidal ideas and hallucinations. The patient is nervous/anxious and has insomnia.     Blood pressure 140/99, pulse 87, temperature 97.6 F (36.4 C), temperature source Oral, resp. rate 20, height 6' 2"  (1.88 m), weight 84.502 kg (186 lb 4.7 oz), SpO2 100 %.Body mass index is 23.91 kg/(m^2).  General Appearance: Disheveled and Guarded  Eye Contact::  Minimal  Speech:  Garbled  Volume:  Decreased  Mood:  Irritable  Affect:  Labile  Thought Process:  Circumstantial and Loose  Orientation:  Other:  He was oriented to his basic situation but had a misunderstanding about what part of the hospital he was in. Also was not able to give me the correct date  Thought Content:  Paranoid Ideation and He is having some paranoid ideation but also he is just confused and rambling from thought to thought.  Suicidal Thoughts:  No  Homicidal Thoughts:  No  Memory:  Immediate;   Fair Recent;   Fair Remote;   Fair  Judgement:  Impaired  Insight:  Shallow  Psychomotor Activity:  Increased and Restlessness  Concentration:  Poor  Recall:  [employer] of Knowledge:Good  Language: Good  Akathisia:  No  Handed:  Right  AIMS (if indicated):     Assets:  Desire for Improvement Financial Resources/Insurance Housing Intimacy Social Support  ADL's:  Impaired  Cognition: Impaired,  Moderate  Sleep:      Medical Decision Making: Established Problem, Worsening (2), Review of Last Therapy Session (1), Review or order medicine tests (1) and Review of Medication Regimen & Side Effects (2)  Treatment Plan Summary: Plan Patient appears to be even more delirious today than he was when I saw him yesterday. I think he is now showing clear delirium tremens. His blood pressures remaining elevated as is his pulse.  He responded when I told him what his situation was and was able to calm down but he does not have a good immediate memory and got confused shortly thereafter. I am going to give him an acute dose of Librium and also some standing Ativan in addition to the detox medication. I would encourage nursing to be liberal in treating his DTs with the detox protocol. Continue the lamotrigine and trazodone. I'm going to switch him to Seroquel which may be a little more sedating in the short-term than the Abilify giving 50 mg 3 times a day. Follow-up daily.  Plan:  Patient does not meet criteria for psychiatric inpatient admission. Supportive therapy provided about ongoing stressors. Discussed crisis plan, support from social network, calling 911, coming to the Emergency Department, and calling Suicide Hotline. Disposition: Patient will remain on the medicine floor. I will consult daily. Continue detox protocol.  Alethia Berthold 03/02/2015 2:58 PM

## 2015-03-02 NOTE — Progress Notes (Signed)
Called by nursing supervisor to evaluate patient for continued violent behavior.  4 police officers present restraining patient at the time of evaluation, has been violent and hallucinating for greater than one hour. He has received haldol, geodon without improvement. Will transfer to ICU and start precedex gtt for agitation, violent behavior, delerium tremens due to alcohol withdrawl and closer monitoring. Currently diaphoretic, tachycardic and hypertensive with frequent vital signs, sitter, mother present.

## 2015-03-02 NOTE — Progress Notes (Signed)
Patient very agitated and trying to exit the floor. Patient is cursing at staff members acusing them of assault. Code 300 called and the security is hear at this moment. Dr Volanda Napoleon notified and she said she was going to order Haldol and restrain.

## 2015-03-02 NOTE — Progress Notes (Addendum)
Patient still agitated  and cursing at staff and security after 1mg  of Haldol IV administration. 2 mg of ativan  IV PRN was  administered  according to the CIWA protocol and patient is still agitated and trying to hurt himself and the staff. Physical restraint initiated by the primary Nurse since the patient is still combative. Two policeman and two security officers are holding onto the patient right now. Family notified . Geodon 10 mg IM is given to the patient on the right anterior thigh by the Supervisor per Dr. Loistine Chance order.

## 2015-03-02 NOTE — Plan of Care (Signed)
Problem: Discharge Progression Outcomes Goal: Other Discharge Outcomes/Goals Outcome: Progressing Pt continued on CIWA scale, shaky with a unsteady gait at time. Sitter at bedside for safety. Ativan given x 1 for agitation/detox. No complaints of pain or nausea/vomitting. Mother at bedside visiting.

## 2015-03-02 NOTE — Progress Notes (Signed)
Pt is a littler calmer. Will continue to acess

## 2015-03-02 NOTE — Progress Notes (Signed)
Pt is very agitated and sweating.  Very loud talking and trying to leave the room.  Encouraging pt to stay in the room.,  Pt wants to leave and go home to eat pizza with the family.  Trying to pull out IV and tele and had to sit with the pt and redirect pt without success.  Medication given.  Will reassess

## 2015-03-03 LAB — BASIC METABOLIC PANEL
Anion gap: 9 (ref 5–15)
BUN: 9 mg/dL (ref 6–20)
CO2: 27 mmol/L (ref 22–32)
CREATININE: 0.7 mg/dL (ref 0.61–1.24)
Calcium: 9.2 mg/dL (ref 8.9–10.3)
Chloride: 104 mmol/L (ref 101–111)
GFR calc Af Amer: >60 mL/min (ref >60)
GFR calc non Af Amer: >60 mL/min (ref >60)
Glucose, Bld: 110 mg/dL — ABNORMAL HIGH (ref 65–99)
Potassium: 3.5 mmol/L (ref 3.5–5.1)
Sodium: 140 mmol/L (ref 135–145)

## 2015-03-03 LAB — CBC
HCT: 46.1 % (ref 40.0–52.0)
Hemoglobin: 16.2 g/dL (ref 13.0–18.0)
MCH: 36.9 pg — ABNORMAL HIGH (ref 26.0–34.0)
MCHC: 35.2 g/dL (ref 32.0–36.0)
MCV: 104.9 fL — ABNORMAL HIGH (ref 80.0–100.0)
Platelets: 133 10*3/uL — ABNORMAL LOW (ref 150–440)
RBC: 4.39 MIL/uL — AB (ref 4.40–5.90)
RDW: 14.3 % (ref 11.5–14.5)
WBC: 7.2 10*3/uL (ref 3.8–10.6)

## 2015-03-03 MED ORDER — CETYLPYRIDINIUM CHLORIDE 0.05 % MT LIQD
7.0000 mL | Freq: Two times a day (BID) | OROMUCOSAL | Status: DC
  Administered 2015-03-03 (×2): 7 mL via OROMUCOSAL

## 2015-03-03 NOTE — Progress Notes (Signed)
[place] at [place] NAME: Jason Hooper    MR#:  [id number]  DATE OF BIRTH:  [birth year]  SUBJECTIVE:  CHIEF COMPLAINT:   Chief Complaint  Patient presents with  . Alcohol Problem  . Hallucinations   Admitted for alcohol detox. Recent extensive alcohol abuse. Very agitated yesterday, had to be moved to ICU for precedex drip. Currently resting well on precedex, also received ativan once.  REVIEW OF SYSTEMS:  Review of Systems  Unable to perform ROS: mental status change  sedated on precedex and also received ativan  DRUG ALLERGIES:  No Known Allergies  VITALS:  Blood pressure 157/116, pulse 63, temperature 98 F (36.7 C), temperature source Oral, resp. rate 16, height 6\' 2"  (1.88 m), weight 84.502 kg (186 lb 4.7 oz), SpO2 98 %.  PHYSICAL EXAMINATION:  Physical Exam  GENERAL:  49 y.o.-year-old patient lying in the bed with no acute distress.  EYES: Pupils equal, round,pinpoint, reactive to light and accommodation. No scleral icterus. Extraocular muscles intact.  HEENT: Head atraumatic, normocephalic. Oropharynx and nasopharynx clear.  NECK:  Supple, no jugular venous distention. No thyroid enlargement, no tenderness.  LUNGS: Normal breath sounds bilaterally, no wheezing, rales,rhonchi or crepitation. No use of accessory muscles of respiration.  CARDIOVASCULAR: S1, S2 normal. No murmurs, rubs, or gallops.  ABDOMEN: Soft, nontender, nondistended. Bowel sounds present. No organomegaly or mass.  EXTREMITIES: No pedal edema, cyanosis, or clubbing.  NEUROLOGIC: Cranial nerves II through XII are intact. Muscle strength 5/5 in all extremities. Sensation intact. Gait not checked. Significant tremors present in both upper extremities. PSYCHIATRIC: The patient is alert and oriented x 3.  SKIN: No obvious rash, lesion, or ulcer.    LABORATORY PANEL:   CBC  Recent Labs Lab 03/03/15 0757  WBC 7.2  HGB 16.2  HCT 46.1  PLT 133*    ------------------------------------------------------------------------------------------------------------------  Chemistries   Recent Labs Lab 02/28/15 1932  03/03/15 0757  NA 143  < > 140  K 3.8  < > 3.5  CL 103  < > 104  CO2 20*  < > 27  GLUCOSE 168*  < > 110*  BUN 13  < > 9  CREATININE 1.05  < > 0.70  CALCIUM 9.6  < > 9.2  AST 125*  --   --   ALT 96*  --   --   ALKPHOS 99  --   --   BILITOT 0.4  --   --   < > = values in this interval not displayed. ------------------------------------------------------------------------------------------------------------------  Cardiac Enzymes No results for input(s): TROPONINI in the last 168 hours. ------------------------------------------------------------------------------------------------------------------  RADIOLOGY:  No results found.  EKG:  No orders found for this or any previous visit.  ASSESSMENT AND PLAN:   49 year old male with past medical history significant for alcohol abuse, hypertension presents to the hospital requesting for alcohol detox.   #1  Alcohol withdrawal-patient was intoxicated on admission with elevated alcohol level. Going through delirium tremens. Moved to ICU for precedex drip. Appreciate psych consult, librium received twice. Also on ativan prn Likely discharge in 2-3 days with outpatient substance abuse follow-up.  #2 hypertension- continue propranolol. Added IV hydralazine when necessary. Likely elevated from withdrawal symptoms.  # 3 Bipolar disorder- cont lamictal, buspar. seroquel and trazadone added. Appreciate consult by Dr. Weber Cooks  #4 DVT prophylaxis- Lovenox   All the records are reviewed and case discussed with Care Management/Social Workerr. Management plans discussed with the patient, family and they are  in agreement.  CODE STATUS: Full code  TOTAL CRITICAL CARE TIME TAKING CARE OF THIS PATIENT: 36 minutes.   POSSIBLE D/C IN 2 DAYS, DEPENDING ON CLINICAL  CONDITION.   Gladstone Lighter M.D on 03/03/2015 at 9:19 AM  Between 7am to 6pm - Pager - [phone]  After 6pm go to [email or web address] - password [email or web address] [facility]  [place] [facility]  Office  [phone]  CC: Primary care physician; No primary care provider on file.

## 2015-03-03 NOTE — Progress Notes (Signed)
Patient is agitated and combative/violent towards himself and the staff as he's  having withdrawal symptoms. Patient was trying to escape the building but was unstable to walk by himself. Staff were trying to redirect patient by using kind and words and being supportive to deescalate the situation. Patient was heading towards the elevator when the code 300 was initiated. Patient was brought to the room with the help of the security men. Patent stated throwing things at staff and was trying to hurt himself in his room when the RN realized he needed a physical restraint to prevent injuries. Dr. Volanda Napoleon was contacted to get order for the restraint.

## 2015-03-03 NOTE — Progress Notes (Signed)
Pt remains on precedex drip, pt now is alert and calm, following commands.  Pt had previously been lethargic with intermittent episodes of agitation.  Required one prn dose ativan so far.  Pt is nsr, lungs clear on room air.  Pt now able to take po and tolerate diet.  Foley remains in place, urine output adequate.  VSS, afebrile.

## 2015-03-03 NOTE — Consult Note (Signed)
  Psychiatry: Follow-up for this patient having delirium tremens. Today he has been moved into the intensive care unit. I spoke with the patient and his mother. Spoke with nursing. Chart reviewed. Patient himself appeared to be sleeping but when I spoke to him he was able to tell me his name and identify his mother and knew that he was in the hospital. He said he was still having some pain. Had no other specific complaints. On review of systems no new complaints. Mental status exam patient is mostly sedated on a Precedex drip. Eyes closed. Able to respond a little bit 2 single questions. No evidence suicidality or dangerousness. Appears to be cooperative with treatment.  Patient undergoing delirium tremens now in the intensive care unit on Precedex. Vital signs stable. Behavior calm. I don't have any orders to change. Continue as he is for now per the management of medicine. Probably gradually wean the Precedex and then see if he can be re-stabilized on detox protocol. Case discussed with mother. No change to diagnosis.

## 2015-03-04 LAB — BASIC METABOLIC PANEL
Anion gap: 8 (ref 5–15)
BUN: 11 mg/dL (ref 6–20)
CO2: 26 mmol/L (ref 22–32)
Calcium: 8.6 mg/dL — ABNORMAL LOW (ref 8.9–10.3)
Chloride: 103 mmol/L (ref 101–111)
Creatinine, Ser: 0.75 mg/dL (ref 0.61–1.24)
GFR calc Af Amer: >60 mL/min (ref >60)
GFR calc non Af Amer: >60 mL/min (ref >60)
Glucose, Bld: 119 mg/dL — ABNORMAL HIGH (ref 65–99)
Potassium: 3 mmol/L — ABNORMAL LOW (ref 3.5–5.1)
Sodium: 137 mmol/L (ref 135–145)

## 2015-03-04 MED ORDER — POTASSIUM CHLORIDE 10 MEQ/100ML IV SOLN
10.0000 meq | INTRAVENOUS | Status: DC
  Administered 2015-03-04: 10 meq via INTRAVENOUS
  Filled 2015-03-04 (×6): qty 100

## 2015-03-04 MED ORDER — POTASSIUM CHLORIDE CRYS ER 20 MEQ PO TBCR
60.0000 meq | EXTENDED_RELEASE_TABLET | Freq: Two times a day (BID) | ORAL | Status: DC
  Administered 2015-03-04 – 2015-03-05 (×3): 60 meq via ORAL
  Filled 2015-03-04 (×3): qty 3

## 2015-03-04 MED ORDER — PANTOPRAZOLE SODIUM 40 MG PO TBEC
40.0000 mg | DELAYED_RELEASE_TABLET | Freq: Two times a day (BID) | ORAL | Status: DC
  Administered 2015-03-04 – 2015-03-05 (×3): 40 mg via ORAL
  Filled 2015-03-04 (×3): qty 1

## 2015-03-04 MED ORDER — THIAMINE HCL 100 MG/ML IJ SOLN
Freq: Once | INTRAVENOUS | Status: AC
  Administered 2015-03-04: 10:00:00 via INTRAVENOUS
  Filled 2015-03-04: qty 1000

## 2015-03-04 MED ORDER — LITHIUM CARBONATE 300 MG PO CAPS
600.0000 mg | ORAL_CAPSULE | Freq: Every day | ORAL | Status: DC
  Administered 2015-03-04: 600 mg via ORAL
  Filled 2015-03-04 (×2): qty 2

## 2015-03-04 NOTE — Progress Notes (Signed)
Patient alert and oriented. NSR on monitor. Off precedex drip since this am.  Pt given ativan prn once per CIWA protocol- potassium replaced.  Foley in place- urine output adequate.  No complaints of pain. Patient given 1 liter multivitamin bag once.  Patient to transfer to room 215.

## 2015-03-04 NOTE — Progress Notes (Signed)
[place] at [place] NAME: Jason Hooper    MR#:  [id number]  DATE OF BIRTH:  03-17-1966  SUBJECTIVE:  CHIEF COMPLAINT:   Chief Complaint  Patient presents with  . Alcohol Problem  . Hallucinations   Remains on Precedex drip at half rate today. Arousable, then gets alert and oriented. Not combative today. Appears more calm and composed. Will be able to wean off Precedex drip likely today.  REVIEW OF SYSTEMS:  Review of Systems  Constitutional: Negative for fever and chills.  Respiratory: Negative for cough, shortness of breath and wheezing.   Cardiovascular: Negative for chest pain and palpitations.  Gastrointestinal: Negative for nausea, vomiting, abdominal pain, diarrhea and constipation.  Genitourinary: Negative for dysuria.  Neurological: Negative for dizziness, seizures and headaches.    DRUG ALLERGIES:  No Known Allergies  VITALS:  Blood pressure 157/98, pulse 59, temperature 97.5 F (36.4 C), temperature source Oral, resp. rate 17, height 6\' 2"  (1.88 m), weight 84 kg (185 lb 3 oz), SpO2 95 %.  PHYSICAL EXAMINATION:  Physical Exam  GENERAL:  49 y.o.-year-old patient lying in the bed with no acute distress.  EYES: Pupils equal, round,pinpoint, reactive to light and accommodation. No scleral icterus. Extraocular muscles intact.  HEENT: Head atraumatic, normocephalic. Oropharynx and nasopharynx clear.  NECK:  Supple, no jugular venous distention. No thyroid enlargement, no tenderness.  LUNGS: Normal breath sounds bilaterally, no wheezing, rales,rhonchi or crepitation. No use of accessory muscles of respiration.  CARDIOVASCULAR: S1, S2 normal. No murmurs, rubs, or gallops.  ABDOMEN: Soft, nontender, nondistended. Bowel sounds present. No organomegaly or mass.  EXTREMITIES: No pedal edema, cyanosis, or clubbing.  NEUROLOGIC: Cranial nerves II through XII are intact. Muscle strength 5/5 in all extremities. Sensation intact.  Gait not checked. Significant tremors present in both upper extremities. PSYCHIATRIC: The patient is arousable, then alert and oriented x 3.  SKIN: No obvious rash, lesion, or ulcer.    LABORATORY PANEL:   CBC  Recent Labs Lab 03/03/15 0757  WBC 7.2  HGB 16.2  HCT 46.1  PLT 133*   ------------------------------------------------------------------------------------------------------------------  Chemistries   Recent Labs Lab 02/28/15 1932  03/04/15 0708  NA 143  < > 137  K 3.8  < > 3.0*  CL 103  < > 103  CO2 20*  < > 26  GLUCOSE 168*  < > 119*  BUN 13  < > 11  CREATININE 1.05  < > 0.75  CALCIUM 9.6  < > 8.6*  AST 125*  --   --   ALT 96*  --   --   ALKPHOS 99  --   --   BILITOT 0.4  --   --   < > = values in this interval not displayed. ------------------------------------------------------------------------------------------------------------------  Cardiac Enzymes No results for input(s): TROPONINI in the last 168 hours. ------------------------------------------------------------------------------------------------------------------  RADIOLOGY:  No results found.  EKG:  No orders found for this or any previous visit.  ASSESSMENT AND PLAN:   49 year old male with past medical history significant for alcohol abuse, hypertension presents to the hospital requesting for alcohol detox.   #1  Alcohol withdrawal-patient was intoxicated on admission with elevated alcohol level. Going through delirium tremens. Moved to ICU for precedex drip. Much improving now. Precedex drip at half rate today. Will likely be weaned off to oral Ativan today. Then can mow out of ICU. Appreciate psych consult, librium received twice. Also on ativan prn Likely discharge in 2-3 days with  outpatient substance abuse follow-up.  #2 hypertension- continue propranolol. Added IV hydralazine when necessary. Likely elevated from withdrawal symptoms.  # 3 Bipolar disorder- cont lamictal,  buspar. seroquel and trazadone added. Appreciate consult by Dr. Weber Cooks  #4 DVT prophylaxis- Lovenox  #5 Hypokalemia-being replaced.   All the records are reviewed and case discussed with Care Management/Social Workerr. Management plans discussed with the patient, family and they are in agreement.  CODE STATUS: Full code  TOTAL TIME TAKING CARE OF THIS PATIENT: 36 minutes.   POSSIBLE D/C IN 1-2 DAYS, DEPENDING ON CLINICAL CONDITION.   Gladstone Lighter M.D on 03/04/2015 at 9:22 AM  Between 7am to 6pm - Pager - [phone]  After 6pm go to [email or web address] - password [email or web address] [facility]  [place] [facility]  Office  [phone]  CC: Primary care physician; No primary care provider on file.

## 2015-03-04 NOTE — Consult Note (Signed)
  Psychiatry: Patient seen and chart and labs reviewed. Spoke with fmily, too. Patient awake and alert today. Off precedex. Mood good. Nausea but otherwise nml ROS. Affect calm and ludid and denies any SI or HI.  For alcohol abuse patient nearing full detox. Education done and patient to follow up with [facility]. For mood we discussed previously effective meds. Will start lithium 600mg  qhs for bipolar . Current renal function fine. PAtient agrees to the plan

## 2015-03-05 LAB — BASIC METABOLIC PANEL
ANION GAP: 8 (ref 5–15)
BUN: 7 mg/dL (ref 6–20)
CHLORIDE: 104 mmol/L (ref 101–111)
CO2: 25 mmol/L (ref 22–32)
Calcium: 9 mg/dL (ref 8.9–10.3)
Creatinine, Ser: 0.81 mg/dL (ref 0.61–1.24)
GFR calc Af Amer: >60 mL/min (ref >60)
Glucose, Bld: 92 mg/dL (ref 65–99)
Potassium: 3.8 mmol/L (ref 3.5–5.1)
Sodium: 137 mmol/L (ref 135–145)

## 2015-03-05 MED ORDER — QUETIAPINE FUMARATE 50 MG PO TABS: 50.0000 mg | ORAL_TABLET | Freq: Three times a day (TID) | ORAL | Status: DC

## 2015-03-05 MED ORDER — TRAZODONE HCL 100 MG PO TABS: 100.0000 mg | ORAL_TABLET | Freq: Every day | ORAL | Status: DC

## 2015-03-05 MED ORDER — PANTOPRAZOLE SODIUM 40 MG PO TBEC: 40.0000 mg | DELAYED_RELEASE_TABLET | Freq: Two times a day (BID) | ORAL | Status: DC

## 2015-03-05 MED ORDER — LAMOTRIGINE 25 MG PO TABS: 25.0000 mg | ORAL_TABLET | Freq: Every day | ORAL | Status: DC

## 2015-03-05 MED ORDER — LITHIUM CARBONATE 600 MG PO CAPS: 600.0000 mg | ORAL_CAPSULE | Freq: Every day | ORAL | Status: DC

## 2015-03-05 MED ORDER — FLUTICASONE PROPIONATE 50 MCG/ACT NA SUSP: 2.0000 | Freq: Every day | NASAL | Status: DC

## 2015-03-05 MED ORDER — BUSPIRONE HCL 10 MG PO TABS: 10.0000 mg | ORAL_TABLET | Freq: Two times a day (BID) | ORAL | Status: DC

## 2015-03-05 MED ORDER — PROPRANOLOL HCL ER 60 MG PO CP24: 60.0000 mg | ORAL_CAPSULE | Freq: Every day | ORAL | Status: DC

## 2015-03-05 MED ORDER — LORAZEPAM 2 MG PO TABS
2.0000 mg | ORAL_TABLET | ORAL | Status: DC | PRN
Start: 2015-03-05 — End: 2015-03-05

## 2015-03-05 NOTE — Discharge Instructions (Signed)

## 2015-03-05 NOTE — Progress Notes (Signed)
Pt discharged to home. A&O. VSS. Pt discharge summary given. Prescriptions given to patient. Prescribed Nasal spray (Flonase) given to patient. Foley removed, pt voided following removal. IV sites removed.

## 2015-03-05 NOTE — Progress Notes (Signed)
[place] at [place] NAME: Jason Hooper    MR#:  [id number]  DATE OF BIRTH:  [birth year]  SUBJECTIVE:  CHIEF COMPLAINT:   Chief Complaint  Patient presents with  . Alcohol Problem  . Hallucinations   Doing well today. Off of Precedex drip since yesterday. On, by mouth Ativan as needed. Alert and oriented. No active signs of withdrawal. Possible discharge home.  REVIEW OF SYSTEMS:  Review of Systems  Constitutional: Negative for fever and chills.  Respiratory: Negative for cough, shortness of breath and wheezing.   Cardiovascular: Negative for chest pain and palpitations.  Gastrointestinal: Negative for nausea, vomiting, abdominal pain, diarrhea and constipation.  Genitourinary: Negative for dysuria.  Neurological: Negative for dizziness, seizures and headaches.    DRUG ALLERGIES:  No Known Allergies  VITALS:  Blood pressure 147/105, pulse 101, temperature 98.6 F (37 C), temperature source Oral, resp. rate 20, height 6\' 2"  (1.88 m), weight 84 kg (185 lb 3 oz), SpO2 100 %.  PHYSICAL EXAMINATION:  Physical Exam  GENERAL:  49 y.o.-year-old patient lying in the bed with no acute distress.  EYES: Pupils equal, round, reactive to light and accommodation. No scleral icterus. Extraocular muscles intact.  HEENT: Head atraumatic, normocephalic. Oropharynx and nasopharynx clear.  NECK:  Supple, no jugular venous distention. No thyroid enlargement, no tenderness.  LUNGS: Normal breath sounds bilaterally, no wheezing, rales,rhonchi or crepitation. No use of accessory muscles of respiration.  CARDIOVASCULAR: S1, S2 normal. No murmurs, rubs, or gallops.  ABDOMEN: Soft, nontender, nondistended. Bowel sounds present. No organomegaly or mass.  EXTREMITIES: No pedal edema, cyanosis, or clubbing.  NEUROLOGIC: Cranial nerves II through XII are intact. Muscle strength 5/5 in all extremities. Sensation intact. Gait not checked. Significant tremors  present in both upper extremities. PSYCHIATRIC: The patient is alert and oriented x 3.  SKIN: No obvious rash, lesion, or ulcer.    LABORATORY PANEL:   CBC  Recent Labs Lab 03/03/15 0757  WBC 7.2  HGB 16.2  HCT 46.1  PLT 133*   ------------------------------------------------------------------------------------------------------------------  Chemistries   Recent Labs Lab 02/28/15 1932  03/05/15 0513  NA 143  < > 137  K 3.8  < > 3.8  CL 103  < > 104  CO2 20*  < > 25  GLUCOSE 168*  < > 92  BUN 13  < > 7  CREATININE 1.05  < > 0.81  CALCIUM 9.6  < > 9.0  AST 125*  --   --   ALT 96*  --   --   ALKPHOS 99  --   --   BILITOT 0.4  --   --   < > = values in this interval not displayed. ------------------------------------------------------------------------------------------------------------------  Cardiac Enzymes No results for input(s): TROPONINI in the last 168 hours. ------------------------------------------------------------------------------------------------------------------  RADIOLOGY:  No results found.  EKG:  No orders found for this or any previous visit.  ASSESSMENT AND PLAN:   49 year old male with past medical history significant for alcohol abuse, hypertension presents to the hospital requesting for alcohol detox.   #1  Alcohol withdrawal-Off precedex drip, doing well. Improved withdrawal symptoms. Receiving Ativan when necessary. Counseling has been done. Outpatient substance abuse rehabilitation recommended. Patient agreeable for the plan. Possible discharge today.  #2 hypertension- continue propranolol. Added IV hydralazine when necessary. Likely elevated from withdrawal symptoms. Better now.  # 3 Bipolar disorder- cont lamictal, buspar. seroquel and trazadone added. Appreciate consult by Dr. Weber Cooks. Lithium added for bipolar  symptoms  #4 DVT prophylaxis- Lovenox  #5 Hypokalemia- replaced.   All the records are reviewed and case  discussed with Care Management/Social Workerr. Management plans discussed with the patient, family and they are in agreement.  CODE STATUS: Full code  TOTAL TIME TAKING CARE OF THIS PATIENT: 36 minutes.   POSSIBLE D/C TODAY, DEPENDING ON CLINICAL CONDITION.   Gladstone Lighter M.D on 03/05/2015 at 9:35 AM  Between 7am to 6pm - Pager - [phone]  After 6pm go to [email or web address] - password [email or web address] [facility]  [place] [facility]  Office  [phone]  CC: Primary care physician; No primary care provider on file.

## 2015-03-05 NOTE — Discharge Summary (Signed)
[place] at [place] NAME: Jason Hooper    MR#:  [id number]  DATE OF BIRTH:  [birth year]  DATE OF ADMISSION:  02/28/2015 ADMITTING PHYSICIAN: Epifanio Lesches, MD  DATE OF DISCHARGE: 03/05/2015  PRIMARY CARE PHYSICIAN: No primary care provider on file.    ADMISSION DIAGNOSIS:  Bipolar affective disorder, currently manic, severe, with psychotic features [F31.2] Alcohol withdrawal, uncomplicated [P23.300] Psychosis, unspecified psychosis type [F29]  DISCHARGE DIAGNOSIS:  Principal Problem:   Alcohol withdrawal delirium Active Problems:   Bipolar affective disorder, currently manic, severe, with psychotic features   Alcohol dependence with intoxication   SECONDARY DIAGNOSIS:  History reviewed. No pertinent past medical history.  HOSPITAL COURSE:   49 year old male with past medical history significant for alcohol abuse, hypertension presents to the hospital requesting for alcohol detox.  #1 Alcohol withdrawal-Off precedex drip, doing well. Improved withdrawal symptoms. Receiving Ativan when necessary. Counseling has been done. Outpatient substance abuse rehabilitation recommended. Patient agreeable for the plan. Possible discharge today.  #2 hypertension- continue propranolol. Added IV hydralazine when necessary. Likely elevated from withdrawal symptoms. Better now.  # 3 Bipolar disorder- cont lamictal, buspar. seroquel and trazadone added. Appreciate consult by Dr. Weber Cooks. Lithium added for bipolar symptoms as it has helped him in the past.  DISCHARGE CONDITIONS:   Stable  CONSULTS OBTAINED:  Treatment Team:  Gonzella Lex, MD  DRUG ALLERGIES:  No Known Allergies  DISCHARGE MEDICATIONS:   Current Discharge Medication List    START taking these medications   Details  busPIRone (BUSPAR) 10 MG tablet Take 1 tablet (10 mg total) by mouth 2 (two) times daily. Qty: 60 tablet, Refills: 0    fluticasone  (FLONASE) 50 MCG/ACT nasal spray Place 2 sprays into both nostrils daily. Qty: 1 g, Refills: 2    lamoTRIgine (LAMICTAL) 25 MG tablet Take 1 tablet (25 mg total) by mouth daily. Qty: 30 tablet, Refills: 0    lithium carbonate 600 MG capsule Take 1 capsule (600 mg total) by mouth at bedtime. Qty: 30 capsule, Refills: 1    pantoprazole (PROTONIX) 40 MG tablet Take 1 tablet (40 mg total) by mouth 2 (two) times daily. Qty: 60 tablet, Refills: 1    propranolol ER (INDERAL LA) 60 MG 24 hr capsule Take 1 capsule (60 mg total) by mouth daily. Qty: 30 capsule, Refills: 1    QUEtiapine (SEROQUEL) 50 MG tablet Take 1 tablet (50 mg total) by mouth 3 (three) times daily. Qty: 90 tablet, Refills: 0    traZODone (DESYREL) 100 MG tablet Take 1 tablet (100 mg total) by mouth at bedtime. Qty: 30 tablet, Refills: 1      STOP taking these medications     sodium chloride (OCEAN) 0.65 % SOLN nasal spray          DISCHARGE INSTRUCTIONS:   1. F/u with [facility] outpatient substance abuse program 2. Psychiatry f/u in community in 1-2 weeks  If you experience worsening of your admission symptoms, develop shortness of breath, life threatening emergency, suicidal or homicidal thoughts you must seek medical attention immediately by calling 911 or calling your MD immediately  if symptoms less severe.  You Must read complete instructions/literature along with all the possible adverse reactions/side effects for all the Medicines you take and that have been prescribed to you. Take any new Medicines after you have completely understood and accept all the possible adverse reactions/side effects.   Please note  You were cared for by a hospitalist  during your hospital stay. If you have any questions about your discharge medications or the care you received while you were in the hospital after you are discharged, you can call the unit and asked to speak with the hospitalist on call if the hospitalist that took care of  you is not available. Once you are discharged, your primary care physician will handle any further medical issues. Please note that NO REFILLS for any discharge medications will be authorized once you are discharged, as it is imperative that you return to your primary care physician (or establish a relationship with a primary care physician if you do not have one) for your aftercare needs so that they can reassess your need for medications and monitor your lab values.    CHIEF COMPLAINT:   Chief Complaint  Patient presents with  . Alcohol Problem  . Hallucinations    VITAL SIGNS:  Blood pressure 147/105, pulse 101, temperature 98.6 F (37 C), temperature source Oral, resp. rate 20, height 6\' 2"  (1.88 m), weight 84 kg (185 lb 3 oz), SpO2 100 %.  I/O:   Intake/Output Summary (Last 24 hours) at 03/05/15 0945 Last data filed at 03/05/15 0745  Gross per 24 hour  Intake    715 ml  Output   2100 ml  Net  -1385 ml    PHYSICAL EXAMINATION:   Physical Exam  GENERAL:  49 y.o.-year-old patient lying in the bed with no acute distress.  EYES: Pupils equal, round, reactive to light and accommodation. No scleral icterus. Extraocular muscles intact.  HEENT: Head atraumatic, normocephalic. Oropharynx and nasopharynx clear.  NECK:  Supple, no jugular venous distention. No thyroid enlargement, no tenderness.  LUNGS: Normal breath sounds bilaterally, no wheezing, rales,rhonchi or crepitation. No use of accessory muscles of respiration.  CARDIOVASCULAR: S1, S2 normal. No murmurs, rubs, or gallops.  ABDOMEN: Soft, non-tender, non-distended. Bowel sounds present. No organomegaly or mass.  EXTREMITIES: No pedal edema, cyanosis, or clubbing.  NEUROLOGIC: Cranial nerves II through XII are intact. Muscle strength 5/5 in all extremities. Sensation intact. Gait not checked.  PSYCHIATRIC: The patient is alert and oriented x 3.  SKIN: No obvious rash, lesion, or ulcer.   DATA REVIEW:   CBC  Recent  Labs Lab 03/03/15 0757  WBC 7.2  HGB 16.2  HCT 46.1  PLT 133*    Chemistries   Recent Labs Lab 02/28/15 1932  03/05/15 0513  NA 143  < > 137  K 3.8  < > 3.8  CL 103  < > 104  CO2 20*  < > 25  GLUCOSE 168*  < > 92  BUN 13  < > 7  CREATININE 1.05  < > 0.81  CALCIUM 9.6  < > 9.0  AST 125*  --   --   ALT 96*  --   --   ALKPHOS 99  --   --   BILITOT 0.4  --   --   < > = values in this interval not displayed.  Cardiac Enzymes No results for input(s): TROPONINI in the last 168 hours.  Microbiology Results  Results for orders placed or performed in visit on 08/31/14  Urine culture     Status: None   Collection Time: 08/31/14  8:35 PM  Result Value Ref Range Status   Micro Text Report   Final       SOURCE: CLEAN CATCH    ORGANISM 1  40,000 CFU/ML COAGULASE NEGATIVE STAPHYLOCOCCUS   ORGANISM 2                3000 CFU GRAM POSITIVE COCCI   ANTIBIOTIC                                                        RADIOLOGY:  No results found.  EKG:  No orders found for this or any previous visit.    Management plans discussed with the patient, family and they are in agreement.  CODE STATUS:     Code Status Orders        Start     Ordered   03/01/15 1639  Full code   Continuous     03/01/15 1638      TOTAL TIME TAKING CARE OF THIS PATIENT: 40 minutes.    Gladstone Lighter M.D on 03/05/2015 at 9:45 AM  Between 7am to 6pm - Pager - [phone]  After 6pm go to [email or web address] - password [email or web address] [facility]  [place] [facility]  Office  [phone]  CC: Primary care physician; No primary care provider on file.

## 2015-03-19 ENCOUNTER — Other Ambulatory Visit: Payer: Self-pay | Admitting: Family Medicine

## 2015-03-21 ENCOUNTER — Other Ambulatory Visit: Payer: Self-pay | Admitting: Family Medicine

## 2015-03-21 NOTE — Telephone Encounter (Signed)
Needs appointment scheduled prior to getting 2nd refill as he was requested to make one 3 months ago. Once appt scheduled, let me know and I'll get him enough to get him to his appointment. Thanks!

## 2015-03-25 ENCOUNTER — Other Ambulatory Visit: Payer: Self-pay | Admitting: Family Medicine

## 2015-03-27 ENCOUNTER — Telehealth: Payer: Self-pay | Admitting: Family Medicine

## 2015-03-27 MED ORDER — PROPRANOLOL HCL ER 60 MG PO CP24: 60.0000 mg | ORAL_CAPSULE | Freq: Every day | ORAL | Status: DC

## 2015-03-27 NOTE — Telephone Encounter (Signed)
[facility] called stated patient needs refill on Propanolol. Thanks.

## 2015-05-07 ENCOUNTER — Other Ambulatory Visit: Payer: Self-pay | Admitting: Family Medicine

## 2015-05-08 NOTE — Telephone Encounter (Signed)
Overdue for an appointment. Needs an appointment. Will get him enough medicine to make it to appointment when it's booked.

## 2015-06-18 NOTE — Telephone Encounter (Signed)
LVM for patient to call and schedule a follow up appointment. Once this is done, enough medication will be sent to his pharmacy.

## 2015-06-20 NOTE — Telephone Encounter (Signed)
Left a voicemail for patient to call and schedule an appointment, once this is done enough medication will be sent to the pharmacy.

## 2015-07-09 NOTE — Telephone Encounter (Signed)
Left another VM requesting call from patient to schedule appointment, medication can be filled at that time, enough to get patient to his appointment

## 2017-04-26 ENCOUNTER — Encounter: Payer: Self-pay | Admitting: Family Medicine

## 2017-04-26 ENCOUNTER — Ambulatory Visit (INDEPENDENT_AMBULATORY_CARE_PROVIDER_SITE_OTHER): Payer: Self-pay

## 2017-04-26 ENCOUNTER — Ambulatory Visit (INDEPENDENT_AMBULATORY_CARE_PROVIDER_SITE_OTHER): Payer: Self-pay | Admitting: Family Medicine

## 2017-04-26 VITALS — BP 148/98 | HR 72 | Temp 97.7°F | Resp 18 | Ht 72.75 in | Wt 191.4 lb

## 2017-04-26 DIAGNOSIS — Z130 Encounter for screening for diseases of the blood and blood-forming organs and certain disorders involving the immune mechanism: Secondary | ICD-10-CM

## 2017-04-26 DIAGNOSIS — F319 Bipolar disorder, unspecified: Secondary | ICD-10-CM | POA: Insufficient documentation

## 2017-04-26 DIAGNOSIS — M545 Low back pain, unspecified: Secondary | ICD-10-CM | POA: Insufficient documentation

## 2017-04-26 DIAGNOSIS — Z13220 Encounter for screening for lipoid disorders: Secondary | ICD-10-CM

## 2017-04-26 DIAGNOSIS — Z125 Encounter for screening for malignant neoplasm of prostate: Secondary | ICD-10-CM

## 2017-04-26 DIAGNOSIS — M25551 Pain in right hip: Secondary | ICD-10-CM

## 2017-04-26 DIAGNOSIS — R030 Elevated blood-pressure reading, without diagnosis of hypertension: Secondary | ICD-10-CM

## 2017-04-26 DIAGNOSIS — N529 Male erectile dysfunction, unspecified: Secondary | ICD-10-CM

## 2017-04-26 DIAGNOSIS — M1A9XX1 Chronic gout, unspecified, with tophus (tophi): Secondary | ICD-10-CM

## 2017-04-26 DIAGNOSIS — G8929 Other chronic pain: Secondary | ICD-10-CM

## 2017-04-26 DIAGNOSIS — T7840XA Allergy, unspecified, initial encounter: Secondary | ICD-10-CM

## 2017-04-26 LAB — CBC
HCT: 46.7 % (ref 39.0–52.0)
Hemoglobin: 16.1 g/dL (ref 13.0–17.0)
MCHC: 34.4 g/dL (ref 30.0–36.0)
MCV: 93.6 fl (ref 78.0–100.0)
Platelets: 259 10*3/uL (ref 150.0–400.0)
RBC: 4.99 Mil/uL (ref 4.22–5.81)
RDW: 12.7 % (ref 11.5–15.5)
WBC: 6.4 10*3/uL (ref 4.0–10.5)

## 2017-04-26 LAB — COMPREHENSIVE METABOLIC PANEL
ALK PHOS: 61 U/L (ref 39–117)
ALT: 24 U/L (ref 0–53)
AST: 23 U/L (ref 0–37)
Albumin: 4.8 g/dL (ref 3.5–5.2)
BILIRUBIN TOTAL: 0.8 mg/dL (ref 0.2–1.2)
BUN: 18 mg/dL (ref 6–23)
CALCIUM: 9.7 mg/dL (ref 8.4–10.5)
CO2: 31 mEq/L (ref 19–32)
Chloride: 100 mEq/L (ref 96–112)
Creatinine, Ser: 0.97 mg/dL (ref 0.40–1.50)
GFR: 86.4 mL/min (ref >60.00)
GLUCOSE: 120 mg/dL — AB (ref 70–99)
POTASSIUM: 4.7 meq/L (ref 3.5–5.1)
Sodium: 139 mEq/L (ref 135–145)
TOTAL PROTEIN: 7.1 g/dL (ref 6.0–8.3)

## 2017-04-26 LAB — LIPID PANEL
Cholesterol: 176 mg/dL (ref 0–200)
HDL: 51.8 mg/dL (ref >39.00)
LDL Cholesterol: 98 mg/dL (ref 0–99)
NONHDL: 124.36
TRIGLYCERIDES: 134 mg/dL (ref 0.0–149.0)
Total CHOL/HDL Ratio: 3
VLDL: 26.8 mg/dL (ref 0.0–40.0)

## 2017-04-26 LAB — PSA: PSA: 0.62 ng/mL (ref 0.10–4.00)

## 2017-04-26 LAB — URIC ACID: Uric Acid, Serum: 5.6 mg/dL (ref 4.0–7.8)

## 2017-04-26 MED ORDER — SILDENAFIL CITRATE 50 MG PO TABS: 50.0000 mg | ORAL_TABLET | Freq: Every day | ORAL | 3 refills | Status: DC | PRN

## 2017-04-26 MED ORDER — DICLOFENAC SODIUM 75 MG PO TBEC: 75.0000 mg | DELAYED_RELEASE_TABLET | Freq: Two times a day (BID) | ORAL | 1 refills | Status: DC

## 2017-04-26 NOTE — Assessment & Plan Note (Signed)
Advise Flonase and antihistamine.

## 2017-04-26 NOTE — Assessment & Plan Note (Signed)
Great toe pain likely secondary to chronic gout. Uric acid today. X-ray today as well.

## 2017-04-26 NOTE — Patient Instructions (Signed)
Diclofenac as prescribed.  We will call with your lab and xray results.  Take care  Dr. Lacinda Axon

## 2017-04-26 NOTE — Assessment & Plan Note (Signed)
New problem. X-ray today. Treating with diclofenac.

## 2017-04-26 NOTE — Progress Notes (Signed)
Subjective:  [name], male    DOB: [birth year]  Age: 51 y.o. MRN: [id number]  CC: Back pain, hip pain, great toe pain, ED  HPI Jason Hooper is a 51 y.o. male presents to the clinic today to establish care. Issues are below.  Low back pain  Chronic.  Left low back.  Has been going on for years.  Intermittently worse at times.  Worse with certain ranges of motion/activity.  Relief with rest.  He's been taking over-the-counter Aleve excessively to help with the pain. He's been taking up to 8 pills daily.  No recent fall, trauma, injury.  Right hip pain  Chronic and worsening as of late.  Moderate in severity  Worse with prolonged sitting.  Improves with activity.  He reports pain is located laterally and in the groin.  No other associated symptoms.  Right great toe pain  Chronic.  Long-standing history of gout.  He is not on any pharmacotherapy for this at this time.  No current warmth or erythema.  Great toe is visibly larger than the left.  Pain with range of motion.  Interfering with gait.  Erectile dysfunction  Chronic.  Having difficulty maintaining an erection.  Wants to discuss pharmacotherapy.  PMH, Surgical Hx, Family Hx, Social History reviewed and updated as below.  Past Medical History:  Diagnosis Date  . Alcohol abuse   . Allergy   . Bipolar disorder ([place])   . GERD (gastroesophageal reflux disease)   . Kidney stones 2014   Past Surgical History:  Procedure Laterality Date  . ESOPHAGEAL DILATION    . TONSILECTOMY/ADENOIDECTOMY WITH MYRINGOTOMY     Family History  Problem Relation Age of Onset  . Adopted: Yes  . Family history unknown: Yes   Social History  Substance Use Topics  . Smoking status: Never Smoker  . Smokeless tobacco: Former Systems developer    Types: Snuff    Quit date: 04/26/2014  . Alcohol use 0.6 - 1.2 oz/week    1 - 2 Cans of beer per week     Comment: occasional    Review of Systems  HENT:  Positive for congestion.        Allergy.  Genitourinary:       Erectile dysfunction.  Musculoskeletal:       Back pain, hip pain. Right great toe pain.  All other systems reviewed and are negative.   Objective:   Today's Vitals: BP (!) 148/98 (BP Location: Left Arm, Patient Position: Sitting, Cuff Size: Normal)   Pulse 72   Temp 97.7 F (36.5 C) (Oral)   Resp 18   Ht 6' 0.75" (1.848 m)   Wt 191 lb 6 oz (86.8 kg)   SpO2 98%   BMI 25.42 kg/m   Physical Exam  Constitutional: He is oriented to person, place, and time. He appears well-developed. No distress.  HENT:  Head: Normocephalic and atraumatic.  Mouth/Throat: Oropharynx is clear and moist.  Eyes: Conjunctivae are normal. No scleral icterus.  Neck: Neck supple. No thyromegaly present.  Cardiovascular: Normal rate and regular rhythm.   No murmur heard. Pulmonary/Chest: Effort normal. He has no wheezes. He has no rales.  Abdominal: Soft. He exhibits no distension. There is no tenderness. There is no rebound and no guarding.  Musculoskeletal:  Lumbar spine - nontender to palpation. Negative straight leg raise.  Right hip - mild pain with FADIR. Mild trochanteric tenderness.  Right hand - dupuytren contracture noted (mild).   Lymphadenopathy:  He has no cervical adenopathy.  Neurological: He is alert and oriented to person, place, and time.  No focal deficits.  Skin: Skin is warm. No rash noted.  Psychiatric: He has a normal mood and affect. His behavior is normal. Thought content normal.  Vitals reviewed.  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 Uric acid today. X-ray today as well.       Relevant Medications   diclofenac (VOLTAREN) 75 MG EC tablet   Other Relevant Orders   Uric acid   DG Toe Great Right     Genitourinary   Erectile  dysfunction    New problem. Trial of Viagra.        Other   Allergy    Advise Flonase and antihistamine.      Chronic left-sided low back pain without sciatica - Primary    New problem. X-ray today. Treating with diclofenac.      Relevant Medications   diclofenac (VOLTAREN) 75 MG EC tablet   Other Relevant Orders   DG Lumbar Spine 2-3 Views   Right hip pain    New problem. X-ray today. Treating with diclofenac.      Relevant Orders   DG HIP UNILAT WITH PELVIS 2-3 VIEWS RIGHT    Other Visit Diagnoses    Elevated BP without diagnosis of hypertension       Relevant Orders   Comprehensive metabolic panel   Screening for deficiency anemia       Relevant Orders   CBC   Screening, lipid       Relevant Orders   Lipid panel   Prostate cancer screening       Relevant Orders   PSA      Meds ordered this encounter  Medications  . diclofenac (VOLTAREN) 75 MG EC tablet    Sig: Take 1 tablet (75 mg total) by mouth 2 (two) times daily.    Dispense:  60 tablet    Refill:  1  . sildenafil (VIAGRA) 50 MG tablet    Sig: Take 1 tablet (50 mg total) by mouth daily as needed for erectile dysfunction. 1 hour prior to sexual activity.    Dispense:  10 tablet    Refill:  3   Follow-up: Return in about 3 months (around 07/27/2017).  [place]

## 2017-04-26 NOTE — Assessment & Plan Note (Signed)
New problem. Trial of Viagra.

## 2017-04-27 ENCOUNTER — Telehealth: Payer: Self-pay | Admitting: *Deleted

## 2017-04-27 NOTE — Telephone Encounter (Signed)
Please advise 

## 2017-04-27 NOTE — Telephone Encounter (Signed)
Pt requested Xray results  Pt contact [phone]

## 2017-04-30 ENCOUNTER — Telehealth: Payer: Self-pay | Admitting: Family Medicine

## 2017-04-30 NOTE — Telephone Encounter (Signed)
I'm not sure there is going to be any medication to help his toe/foot (has severe arthritis). May need injection. Recommend seeing podiatry

## 2017-04-30 NOTE — Telephone Encounter (Signed)
Patient would like referral.

## 2017-04-30 NOTE — Telephone Encounter (Signed)
Please advise 

## 2017-04-30 NOTE — Telephone Encounter (Signed)
Pt called about having some issues with medication diclofenac (VOLTAREN) 75 MG EC tablet. Pt states it helps with inflammation but not the pain in the foot. Pt states he does walk 8 miles a day. Pt would like to know if there are any other options?   Call pt @ [phone]. Thank you!

## 2017-05-03 ENCOUNTER — Other Ambulatory Visit: Payer: Self-pay | Admitting: Family Medicine

## 2017-05-03 DIAGNOSIS — M19071 Primary osteoarthritis, right ankle and foot: Secondary | ICD-10-CM

## 2017-05-05 NOTE — Telephone Encounter (Signed)
Dr Lacinda Axon patient

## 2017-05-05 NOTE — Telephone Encounter (Signed)
Pt stated that he would like to have his referral go to [facility].  Pt contact [phone]

## 2017-05-06 NOTE — Telephone Encounter (Signed)
Patient was referred to [facility]. He is already scheduled on 8/6

## 2017-05-06 NOTE — Telephone Encounter (Signed)
Melissa,  I have placed referral. Will you take care of this please?  JC DO

## 2017-05-06 NOTE — Telephone Encounter (Signed)
Patient aware out of office .

## 2017-05-06 NOTE — Telephone Encounter (Signed)
Please advise, thanks.

## 2017-05-17 ENCOUNTER — Ambulatory Visit: Payer: Self-pay

## 2017-05-17 ENCOUNTER — Encounter: Payer: Self-pay | Admitting: Podiatry

## 2017-05-17 ENCOUNTER — Ambulatory Visit (INDEPENDENT_AMBULATORY_CARE_PROVIDER_SITE_OTHER): Payer: BLUE CROSS/BLUE SHIELD | Admitting: Podiatry

## 2017-05-17 VITALS — BP 141/94 | HR 80 | Resp 16

## 2017-05-17 DIAGNOSIS — M109 Gout, unspecified: Secondary | ICD-10-CM

## 2017-05-17 DIAGNOSIS — M779 Enthesopathy, unspecified: Secondary | ICD-10-CM

## 2017-05-17 DIAGNOSIS — M205X1 Other deformities of toe(s) (acquired), right foot: Secondary | ICD-10-CM

## 2017-05-17 DIAGNOSIS — M778 Other enthesopathies, not elsewhere classified: Secondary | ICD-10-CM

## 2017-05-17 DIAGNOSIS — M7751 Other enthesopathy of right foot: Secondary | ICD-10-CM | POA: Diagnosis not present

## 2017-05-17 NOTE — Progress Notes (Signed)
   Subjective:    [name], male    DOB: 11/18/1965, 51 y.o.   MRN: [id number]  HPI: Jason Hooper presents today with chief complaint of pain to the first metatarsophalangeal joint of the right foot. He states that he's had gout and has been treated for gout for several years. He states that he has recently had issues with his primary care provider who would not give him gout medication and feels that his gout has gotten worse. He was prescribed initially allopurinol and colchicine but his primary care provider would not refill that prescription. He states that now that he is taking diclofenac 75 mg twice a day his pain is worse. He denies any trauma to the foot however his life but states as far as he knows he has never had a high uric acid level.    Review of Systems  Musculoskeletal: Positive for arthralgias.  All other systems reviewed and are negative.      Objective:   Physical Exam: Vital signs are stable he is alert and oriented 3 pulses are palpable. Deep tendon reflexes are intact muscle strength is normal neurologic sensorium is intact. Muscle strength was 5 over 5 dorsiflexion plantar flexors and inverters everters all Jones musculature is intact. Orthopedic evaluation dentures all joints distally to the ankle for range of motion without crepitation with exception of rigid crepitation to the first metatarsophalangeal joint of the right foot was nontender on palpation thickened with hypertrophic bone and edema.  Radiographs taken elsewhere were evaluated today demonstrating moderate to severe osteoarthritic change of the first metatarsophalangeal joint. This does not appear to be gout and that there are no telltale signs of gout such a cystic formation or Martel sign.  I also evaluated his labs and reviewed them demonstrating no elevated levels of uric acid in the past.        Assessment & Plan:  Osteoarthritis first metatarsophalangeal joint right foot obviously could  be associated with gout but is not appear to be.  Plan: Discussed etiology pathology conservative versus surgical therapies. At this point we discussed surgery in great day till consisting of the first metatarsophalangeal joint arthroplasty with a single silicone implant. I answered all the questions regarding this procedure was moderately lens terms. He will reappoint for surgical consult at which time his radiographs will be taken in our office and we will demonstrate to him exactly will we have to do surgically. I will follow-up with him the next couple weeks after he has made his decision as to before or after the holidays.

## 2017-05-31 ENCOUNTER — Encounter: Payer: Self-pay | Admitting: Podiatry

## 2017-05-31 ENCOUNTER — Ambulatory Visit (INDEPENDENT_AMBULATORY_CARE_PROVIDER_SITE_OTHER): Payer: BLUE CROSS/BLUE SHIELD | Admitting: Podiatry

## 2017-05-31 ENCOUNTER — Ambulatory Visit (INDEPENDENT_AMBULATORY_CARE_PROVIDER_SITE_OTHER): Payer: BLUE CROSS/BLUE SHIELD

## 2017-05-31 DIAGNOSIS — M205X1 Other deformities of toe(s) (acquired), right foot: Secondary | ICD-10-CM | POA: Diagnosis not present

## 2017-05-31 NOTE — Progress Notes (Signed)
He presents today for surgical consult regarding the first metatarsophalangeal joint of the right foot. He states that the foot has become so painful that is causing him to rotate his leg laterally in order to walk. He states that this is causing much hip pain that he is worried that he is going to have to have a revision hip replacement. No changes in his past medical history medications allergies surgeries or social history.  Objective: Vital signs are stable alert and oriented 3. Pulses are palpable. Neurologic sensorium is intact. Deep tendon reflexes are intact. Muscle strength +5 over 5 dorsiflexion plantar flexors and inverters everters onto the musculature is intact. Orthopedic evaluation and straight hallux valgus deformity right foot rigid first metatarsophalangeal joint exquisitely painful on palpation. Radiographs 3 views taken in the office today demonstrate hallux valgus deformity with complete joint space narrowing subchondral sclerosis and eburnation with spurring.  Assessment: Pain in limb secondary to severe hallux rigidus first metatarsophalangeal joint right foot.  Plan: We discussed the etiology pathology conservative versus surgical treatments. At this point we went over a consent form today line by line over by number giving him ample time to ask questions he saw fit regarding a Keller arthroplasty with a single silicone implant to the first metatarsophalangeal joint right foot. I answered all the questions regarding these procedures to the best of my ability in layman's terms. He understood this was amenable to it and signed all 3 pages of the consent form. We discussed approximately 6 weeks out of work while this is healing he understands this and is amenable to it. 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 We dispensed a cam walker today and discussed all the possible postop complications which may include but are  not limited to postop pain bleeding swelling infection recurrence need for further surgery. He was given guidelines into the mallet time that he is going need to be off of this foot and keeping it elevated. He was provided with both oral and written home-going instructions. We also discussed the surgery center as well as anesthesia.

## 2017-05-31 NOTE — Patient Instructions (Signed)
Pre-Operative Instructions  Congratulations, you have decided to take an important step towards improving your quality of life.  You can be assured that the doctors and staff at [facility] will be with you every step of the way.  Here are some important things you should know:  1. Plan to be at the surgery center/hospital at least 1 (one) hour prior to your scheduled time, unless otherwise directed by the surgical center/hospital staff.  You must have a responsible adult accompany you, remain during the surgery and drive you home.  Make sure you have directions to the surgical center/hospital to ensure you arrive on time. 2. If you are having surgery at [facility] or [facility] hospitals, you will need a copy of your medical history and physical form from your family physician within one month prior to the date of surgery. We will give you a form for your primary physician to complete.  3. We make every effort to accommodate the date you request for surgery.  However, there are times where surgery dates or times have to be moved.  We will contact you as soon as possible if a change in schedule is required.   4. No aspirin/ibuprofen for one week before surgery.  If you are on aspirin, any non-steroidal anti-inflammatory medications (Mobic, Aleve, Ibuprofen) should not be taken seven (7) days prior to your surgery.  You make take Tylenol for pain prior to surgery.  5. Medications - If you are taking daily heart and blood pressure medications, seizure, reflux, allergy, asthma, anxiety, pain or diabetes medications, make sure you notify the surgery center/hospital before the day of surgery so they can tell you which medications you should take or avoid the day of surgery. 6. No food or drink after midnight the night before surgery unless directed otherwise by surgical center/hospital staff. 7. No alcoholic beverages 24-hours prior to surgery.  No smoking 24-hours prior or 24-hours after  surgery. 8. Wear loose pants or shorts. They should be loose enough to fit over bandages, boots, and casts. 9. Don't wear slip-on shoes. Sneakers are preferred. 10. Bring your boot with you to the surgery center/hospital.  Also bring crutches or a walker if your physician has prescribed it for you.  If you do not have this equipment, it will be provided for you after surgery. 11. If you have not been contacted by the surgery center/hospital by the day before your surgery, call to confirm the date and time of your surgery. 12. Leave-time from work may vary depending on the type of surgery you have.  Appropriate arrangements should be made prior to surgery with your employer. 13. Prescriptions will be provided immediately following surgery by your doctor.  Fill these as soon as possible after surgery and take the medication as directed. Pain medications will not be refilled on weekends and must be approved by the doctor. 14. Remove nail polish on the operative foot and avoid getting pedicures prior to surgery. 15. Wash the night before surgery.  The night before surgery wash the foot and leg well with water and the antibacterial soap provided. Be sure to pay special attention to beneath the toenails and in between the toes.  Wash for at least three (3) minutes. Rinse thoroughly with water and dry well with a towel.  Perform this wash unless told not to do so by your physician.  Enclosed: 1 Ice pack (please put in freezer the night before surgery)   1 Hibiclens skin cleaner     Pre-op instructions  If you have any questions regarding the instructions, please do not hesitate to call our office.  [place]: [address], [place], [place] [address] -- [phone]  [place]: [address] [place], [place] [address] -- [phone]  [place]: [address]  [place], [place] [address] -- [phone]  [place]: [address], [address], [place], [place] [address] -- [phone]  Website:  [email or web address] 

## 2017-06-02 ENCOUNTER — Telehealth: Payer: Self-pay | Admitting: *Deleted

## 2017-06-02 NOTE — Telephone Encounter (Signed)
"  I'm a patient of Dr. Milinda Pointer in [place].  I'm trying to schedule foot surgery at his earliest convenience.  If you would, give me a call back."

## 2017-06-02 NOTE — Telephone Encounter (Signed)
"  I'm a patient of Dr. Milinda Pointer.  I was calling to get a surgery scheduled."

## 2017-06-02 NOTE — Telephone Encounter (Signed)
I'm returning your call.  You wanted to schedule your surgery?  "Yes, what days does he have available?"  He can do it next Friday, August 31 or any Friday in September except the 28th.  "I'm call you back tomorrow.  I need to check with my boss to see what date is best to do it on."

## 2017-06-08 ENCOUNTER — Telehealth: Payer: Self-pay | Admitting: *Deleted

## 2017-06-08 NOTE — Telephone Encounter (Signed)
"  I was calling to schedule foot surgery with Dr. Milinda Pointer.  You had given me some dates.  I believe you had September 21 open.  I'd like to get put down for the 21st if that's possible you can cal me back.  Thanks Jason Hooper, talk to you soon

## 2017-06-08 NOTE — Telephone Encounter (Signed)
I called and left patient a message that I would schedule him an appointment for July 02, 2017.

## 2017-06-08 NOTE — Telephone Encounter (Signed)
"  Hey Vy Badley this is Cavan Bearden calling again.  I appreciate your return call.  Sorry I couldn't get it.  I was wondering if I could reschedule that surgery to October 5, I believe it's a Friday.  I didn't get a chance to talk to my employers until after I scheduled it yesterday.  I thought I had it figured out.  They wanted me to schedule it out a little further.  I hope that's available and I hope it's no problem to change it at this time.  Have a good day Tammie Yanda."

## 2017-06-10 NOTE — Telephone Encounter (Signed)
"  I called you yesterday and left a message about rescheduling my surgery."  Yes, we can switch your date from 07/02/2017 to 07/16/2017.  "Okay great, thank you."

## 2017-07-01 ENCOUNTER — Other Ambulatory Visit: Payer: Self-pay | Admitting: Family Medicine

## 2017-07-14 ENCOUNTER — Other Ambulatory Visit: Payer: Self-pay | Admitting: Podiatry

## 2017-07-14 MED ORDER — PROMETHAZINE HCL 25 MG PO TABS: 25.0000 mg | ORAL_TABLET | Freq: Three times a day (TID) | ORAL | 0 refills | Status: DC | PRN

## 2017-07-14 MED ORDER — CEPHALEXIN 500 MG PO CAPS: 500.0000 mg | ORAL_CAPSULE | Freq: Three times a day (TID) | ORAL | 0 refills | Status: DC

## 2017-07-14 MED ORDER — OXYCODONE-ACETAMINOPHEN 10-325 MG PO TABS: 1.0000 | ORAL_TABLET | ORAL | 0 refills | Status: DC | PRN

## 2017-07-16 ENCOUNTER — Encounter: Payer: Self-pay | Admitting: Podiatry

## 2017-07-16 DIAGNOSIS — M21611 Bunion of right foot: Secondary | ICD-10-CM | POA: Diagnosis not present

## 2017-07-16 DIAGNOSIS — K219 Gastro-esophageal reflux disease without esophagitis: Secondary | ICD-10-CM | POA: Diagnosis not present

## 2017-07-16 DIAGNOSIS — M2011 Hallux valgus (acquired), right foot: Secondary | ICD-10-CM | POA: Diagnosis not present

## 2017-07-16 DIAGNOSIS — M205X1 Other deformities of toe(s) (acquired), right foot: Secondary | ICD-10-CM | POA: Diagnosis not present

## 2017-07-16 DIAGNOSIS — M25571 Pain in right ankle and joints of right foot: Secondary | ICD-10-CM | POA: Diagnosis not present

## 2017-07-16 HISTORY — PX: JOINT REPLACEMENT: SHX530

## 2017-07-21 ENCOUNTER — Encounter: Payer: Self-pay | Admitting: Podiatry

## 2017-07-21 ENCOUNTER — Ambulatory Visit (INDEPENDENT_AMBULATORY_CARE_PROVIDER_SITE_OTHER): Payer: BLUE CROSS/BLUE SHIELD | Admitting: Podiatry

## 2017-07-21 ENCOUNTER — Ambulatory Visit (INDEPENDENT_AMBULATORY_CARE_PROVIDER_SITE_OTHER): Payer: BLUE CROSS/BLUE SHIELD

## 2017-07-21 VITALS — BP 144/93 | HR 86 | Temp 97.8°F | Resp 16

## 2017-07-21 DIAGNOSIS — M205X1 Other deformities of toe(s) (acquired), right foot: Secondary | ICD-10-CM

## 2017-07-21 NOTE — Progress Notes (Signed)
He presents today 1 week status post Keller arthroplasty with a single silicone implant first metatarsophalangeal joint of the right foot. He states this doing really well he states that the pain is tolerable 3 out of 10. Denies fever chills nausea vomiting muscle aches and pains.  Objective: Temperature is normal today 97.3 but his blood pressure is elevated however it was coming down before he left the office. Presents with his Cam Gilford Rile today and his wife. States that he's been removing the [employer] and working his toes. He has not walked without Cam Walker. Dry sterile dressing intact was removed demonstrates no erythema edema cellulitis drainage or odor. Moderate edema into the first metatarsal space only tenderness on range of motion active and passive  Radiographs 3 views right foot taken today demonstrates a Keller arthroplasty in good position single silicone implant intact. No cutaneous crepitus noted  Assessment: Well-healing surgical foot right.  Plan: Redressed with a dry sterile compressive dressing reapplied Cam Walker he is to start active and passive range of motion as much as possible. Follow up with him in 1 week

## 2017-07-27 ENCOUNTER — Ambulatory Visit: Payer: Self-pay | Admitting: Family

## 2017-07-27 ENCOUNTER — Ambulatory Visit: Payer: Self-pay | Admitting: Family Medicine

## 2017-07-28 ENCOUNTER — Ambulatory Visit (INDEPENDENT_AMBULATORY_CARE_PROVIDER_SITE_OTHER): Payer: BLUE CROSS/BLUE SHIELD | Admitting: Podiatry

## 2017-07-28 ENCOUNTER — Ambulatory Visit: Payer: BLUE CROSS/BLUE SHIELD | Admitting: Podiatry

## 2017-07-28 ENCOUNTER — Encounter: Payer: Self-pay | Admitting: Podiatry

## 2017-07-28 DIAGNOSIS — M205X1 Other deformities of toe(s) (acquired), right foot: Secondary | ICD-10-CM | POA: Diagnosis not present

## 2017-07-28 NOTE — Progress Notes (Signed)
He presents today for follow-up of his Jake Michaelis bunion implant he states it started, little sore around the joint. Date of surgery 07/16/2017.  Objective: Vital signs are stable alert and oriented 3. There is no calf pain. He has good range of motion of the first metatarsophalangeal joint of the right foot remove the Steri-Strips today which do demonstrate a mild dehiscence the distal aspect of the incision site and then the proximal aspect as well. There are no signs of infection. He has moderate edema present due to the fact that he's been up and about his wife.  Assessment: Well-healing surgical foot right.  Plan: I will allow him to start getting this wet he may go ahead and soak it in Epsom salts and water after showering. He will continue to cover these 2 open areas daily after his soaks and showers. We placed him in a Darco shoe and I hope to get him into a compression anklet and a tennis shoe next visit.

## 2017-07-29 ENCOUNTER — Telehealth: Payer: Self-pay | Admitting: Podiatry

## 2017-07-30 NOTE — Telephone Encounter (Signed)
Returned patient call, left message to call office back

## 2017-07-30 NOTE — Telephone Encounter (Signed)
Hello, this is Eulon "[employer]. I was calling to speak to the nurse. If you could, please call me back at [phone]. Thank you very much.

## 2017-08-11 ENCOUNTER — Ambulatory Visit (INDEPENDENT_AMBULATORY_CARE_PROVIDER_SITE_OTHER): Payer: BLUE CROSS/BLUE SHIELD

## 2017-08-11 ENCOUNTER — Ambulatory Visit (INDEPENDENT_AMBULATORY_CARE_PROVIDER_SITE_OTHER): Payer: BLUE CROSS/BLUE SHIELD | Admitting: Podiatry

## 2017-08-11 ENCOUNTER — Encounter: Payer: Self-pay | Admitting: Podiatry

## 2017-08-11 DIAGNOSIS — M205X1 Other deformities of toe(s) (acquired), right foot: Secondary | ICD-10-CM

## 2017-08-14 NOTE — Progress Notes (Signed)
He presents today for follow-up of his Jason Hooper arthroplasty first metatarsophalangeal joint right foot. Date of surgery 07/16/2017. Jason Hooper states that his foot is doing great.  Objective: Minimal edema no erythema cellulitis drainage or odor pulses are strongly palpable. He has great range of motion of the first metatarsophalangeal joint and his incision has completely healed.  Assessment: Well-healing surgical foot.  Plan: Encourage range of motion exercises I will allow him to get back into regular shoe gear and follow up with me in 1 month or sooner if needed.

## 2017-08-30 ENCOUNTER — Encounter: Payer: Self-pay | Admitting: Podiatry

## 2017-08-30 ENCOUNTER — Ambulatory Visit (INDEPENDENT_AMBULATORY_CARE_PROVIDER_SITE_OTHER): Payer: BLUE CROSS/BLUE SHIELD

## 2017-08-30 ENCOUNTER — Ambulatory Visit (INDEPENDENT_AMBULATORY_CARE_PROVIDER_SITE_OTHER): Payer: BLUE CROSS/BLUE SHIELD | Admitting: Podiatry

## 2017-08-30 DIAGNOSIS — M205X1 Other deformities of toe(s) (acquired), right foot: Secondary | ICD-10-CM

## 2017-08-30 NOTE — Progress Notes (Signed)
He presents today for follow-up of his Jake Michaelis arthroplasty with a single silicone implant right foot. He states that he is having some numbness and tingling still and some swelling in his right forefoot. States that he has a little bit of pain beneath the second metatarsal head of the right foot. States that he may have overdone it is a little bit. He continues to do his exercises daily at home. States that he was supposed to back to work this week but states that he does not feel of S1.  Objective: Vital signs are stable he is alert and oriented 3 there is moderate edema around the first metatarsophalangeal joint but on radiographic looks much better than it did previously. He has good range of motion of the first metatarsophalangeal joint that with the edema it does limit his range of motion to some degree.  Assessment: Well-healing surgical foot. Limited by edema and range of motion. One limb was likely resulting in some tenderness of the second metatarsophalangeal joint.  Plan: I encouraged him to continue his range of motion activities at home. Also provided him with 2 compression anklet's. He questioned whether formal physical therapy would be necessary I expressed to him that he is more than walk him to do this but most patients do progress quite well without physical therapy. He would like to try this for a short period before he goes back to work.

## 2017-09-06 ENCOUNTER — Telehealth: Payer: Self-pay | Admitting: Podiatry

## 2017-09-07 ENCOUNTER — Encounter: Payer: Self-pay | Admitting: Podiatry

## 2017-09-07 ENCOUNTER — Telehealth: Payer: Self-pay | Admitting: Podiatry

## 2017-09-07 NOTE — Telephone Encounter (Signed)
I did not receive this yesterday.  Who took the message and why didn't I get it?  This has been happening a lot lately.  Yes he can go back to work with lite duty and elevation for the next month.

## 2017-09-07 NOTE — Telephone Encounter (Signed)
Patient called yesterday asking for return to work letter. He has not heard anything. Would like to go back to work with possible light duty restrictions to be allowed to sit and elevate foot as needed during his 8 hour shift. Please advise if the note can be written? Thanks!

## 2017-09-08 ENCOUNTER — Other Ambulatory Visit: Payer: Self-pay | Admitting: Family Medicine

## 2017-09-08 NOTE — Telephone Encounter (Signed)
Left message to return call, ok for [facility] to  speak to patient  

## 2017-09-09 ENCOUNTER — Telehealth: Payer: Self-pay | Admitting: Podiatry

## 2017-09-09 DIAGNOSIS — M79674 Pain in right toe(s): Secondary | ICD-10-CM | POA: Diagnosis not present

## 2017-09-09 DIAGNOSIS — M25571 Pain in right ankle and joints of right foot: Secondary | ICD-10-CM | POA: Diagnosis not present

## 2017-09-09 NOTE — Telephone Encounter (Signed)
Please advise on what else to give him

## 2017-09-10 NOTE — Telephone Encounter (Signed)
Patient needs an appointment to establish with NP Arnett ,in order for medication refills. [facility] may schedule.

## 2017-09-10 NOTE — Telephone Encounter (Signed)
Copied from [place] #[id number]. Topic: Quick Communication - See Telephone Encounter >> Sep 10, 2017  2:44 PM Ether Griffins B wrote: CRM for notification. See Telephone encounter for:  Pt has been scheduled with arnett in Feb. And is needing a refill on Voltaren  09/10/17.

## 2017-09-10 NOTE — Telephone Encounter (Signed)
Okay to send in Tramadol for him

## 2017-09-10 NOTE — Telephone Encounter (Signed)
Can refill Voltaren til appointment with NP?

## 2017-09-13 MED ORDER — TRAMADOL HCL 50 MG PO TABS: 50.0000 mg | ORAL_TABLET | Freq: Three times a day (TID) | ORAL | 0 refills | Status: DC | PRN

## 2017-09-13 NOTE — Addendum Note (Signed)
Addended by: Graceann Congress D on: 09/13/2017 02:50 PM   Modules accepted: Orders

## 2017-09-13 NOTE — Telephone Encounter (Signed)
Per Dr. Milinda Pointer, rx for Tramadol has been printed and left at front desk for patient to pick up.  Patient is aware of rx.

## 2017-09-14 DIAGNOSIS — M25571 Pain in right ankle and joints of right foot: Secondary | ICD-10-CM | POA: Diagnosis not present

## 2017-09-14 DIAGNOSIS — M79674 Pain in right toe(s): Secondary | ICD-10-CM | POA: Diagnosis not present

## 2017-09-17 DIAGNOSIS — M79674 Pain in right toe(s): Secondary | ICD-10-CM | POA: Diagnosis not present

## 2017-09-17 DIAGNOSIS — M25571 Pain in right ankle and joints of right foot: Secondary | ICD-10-CM | POA: Diagnosis not present

## 2017-09-27 ENCOUNTER — Encounter: Payer: Self-pay | Admitting: Podiatry

## 2017-09-27 ENCOUNTER — Ambulatory Visit (INDEPENDENT_AMBULATORY_CARE_PROVIDER_SITE_OTHER): Payer: BLUE CROSS/BLUE SHIELD

## 2017-09-27 ENCOUNTER — Ambulatory Visit (INDEPENDENT_AMBULATORY_CARE_PROVIDER_SITE_OTHER): Payer: BLUE CROSS/BLUE SHIELD | Admitting: Podiatry

## 2017-09-27 DIAGNOSIS — M205X1 Other deformities of toe(s) (acquired), right foot: Secondary | ICD-10-CM

## 2017-09-27 MED ORDER — TRAMADOL HCL 50 MG PO TABS: 50.0000 mg | ORAL_TABLET | Freq: Three times a day (TID) | ORAL | 0 refills | Status: DC | PRN

## 2017-09-27 MED ORDER — METHYLPREDNISOLONE 4 MG PO TBPK: ORAL_TABLET | ORAL | 0 refills | Status: DC

## 2017-09-27 NOTE — Progress Notes (Signed)
He presents today for follow-up of his Jason Hooper arthroplasty date of surgery is 07/16/2017 right foot.  States his been hurting a little more recently but I am sure have been pushing it extra hard at work.  He states that he has attended 3 sessions with physical therapy because of this note in bad weather and work conditions he has not been able to see them this week.  Objective: Vital signs are stable he is alert and oriented x3 much decrease in the edema however his limitation range of motion particular dorsiflexion is seems to be getting worse.  I feel that this is not going through physical therapy is well as I had hoped or that the very least he is not doing his part at home.  It is moderately tender on range of motion.  Radiographs taken today demonstrate no change in previous radiographs so at this point unless further radiographs are needed or there is a change they will not be performed.  Assessment: Tight first metatarsophalangeal joint status post Keller arthroplasty single silicone implant.  Plan: Discussed etiology pathology conservative surgical therapies.  I recommended that he continue with physical therapy and I started him on a Medrol Dosepak I will follow-up with him in the next few weeks.

## 2017-10-04 DIAGNOSIS — M25571 Pain in right ankle and joints of right foot: Secondary | ICD-10-CM | POA: Diagnosis not present

## 2017-10-04 DIAGNOSIS — M79674 Pain in right toe(s): Secondary | ICD-10-CM | POA: Diagnosis not present

## 2017-10-08 DIAGNOSIS — M25571 Pain in right ankle and joints of right foot: Secondary | ICD-10-CM | POA: Diagnosis not present

## 2017-10-08 DIAGNOSIS — M79674 Pain in right toe(s): Secondary | ICD-10-CM | POA: Diagnosis not present

## 2017-10-13 DIAGNOSIS — M79674 Pain in right toe(s): Secondary | ICD-10-CM | POA: Diagnosis not present

## 2017-10-13 DIAGNOSIS — M25571 Pain in right ankle and joints of right foot: Secondary | ICD-10-CM | POA: Diagnosis not present

## 2017-10-15 DIAGNOSIS — M79674 Pain in right toe(s): Secondary | ICD-10-CM | POA: Diagnosis not present

## 2017-10-15 DIAGNOSIS — M25571 Pain in right ankle and joints of right foot: Secondary | ICD-10-CM | POA: Diagnosis not present

## 2017-10-20 DIAGNOSIS — M79674 Pain in right toe(s): Secondary | ICD-10-CM | POA: Diagnosis not present

## 2017-10-20 DIAGNOSIS — M25571 Pain in right ankle and joints of right foot: Secondary | ICD-10-CM | POA: Diagnosis not present

## 2017-10-22 DIAGNOSIS — M25571 Pain in right ankle and joints of right foot: Secondary | ICD-10-CM | POA: Diagnosis not present

## 2017-10-22 DIAGNOSIS — M79674 Pain in right toe(s): Secondary | ICD-10-CM | POA: Diagnosis not present

## 2017-10-25 ENCOUNTER — Encounter: Payer: Self-pay | Admitting: Podiatry

## 2017-10-25 ENCOUNTER — Ambulatory Visit (INDEPENDENT_AMBULATORY_CARE_PROVIDER_SITE_OTHER): Payer: BLUE CROSS/BLUE SHIELD | Admitting: Podiatry

## 2017-10-25 ENCOUNTER — Ambulatory Visit (INDEPENDENT_AMBULATORY_CARE_PROVIDER_SITE_OTHER): Payer: BLUE CROSS/BLUE SHIELD

## 2017-10-25 DIAGNOSIS — M205X1 Other deformities of toe(s) (acquired), right foot: Secondary | ICD-10-CM

## 2017-10-25 MED ORDER — TRAMADOL HCL 50 MG PO TABS: 50.0000 mg | ORAL_TABLET | Freq: Three times a day (TID) | ORAL | 0 refills | Status: DC | PRN

## 2017-10-25 NOTE — Progress Notes (Signed)
He presents today for follow-up of his Jason Hooper bunion implant states that is still quite sore by the end of the day or even a couple of hours after have been on it.  Date of surgery was July 16, 2017.  Objective: Vital signs are stable he is alert and oriented x3.  Pulses are palpable.  He has good range of motion of the first metatarsophalangeal joint of the right foot without pain.  He continues to wear his compression anklet regularly.  Radiographs confirm much decrease in edema from previous radiographs no abnormality of the implants noted.  Assessment: Resolving pain associated with first metatarsophalangeal joint implant continues physical therapy.  Plan: Continue physical therapy until he has improved.  Also recommended 600 mg of ibuprofen with an extra strength Tylenol for pain control.  We may need to consider orthotics with him.  Follow-up with him in 6 weeks.

## 2017-11-12 NOTE — Progress Notes (Signed)
DOS 07/16/17 Keller bunion arthroplasty w/ single silicone implant 1st MPJ Rt

## 2017-11-24 ENCOUNTER — Ambulatory Visit: Payer: Self-pay | Admitting: Family

## 2017-11-25 ENCOUNTER — Ambulatory Visit: Payer: Self-pay | Admitting: Family

## 2017-12-08 ENCOUNTER — Encounter: Payer: Self-pay | Admitting: Podiatry

## 2017-12-08 ENCOUNTER — Ambulatory Visit (INDEPENDENT_AMBULATORY_CARE_PROVIDER_SITE_OTHER): Payer: BLUE CROSS/BLUE SHIELD | Admitting: Podiatry

## 2017-12-08 DIAGNOSIS — Z9889 Other specified postprocedural states: Secondary | ICD-10-CM

## 2017-12-08 DIAGNOSIS — M205X1 Other deformities of toe(s) (acquired), right foot: Secondary | ICD-10-CM

## 2017-12-08 MED ORDER — TRAMADOL HCL 50 MG PO TABS: 50.0000 mg | ORAL_TABLET | Freq: Three times a day (TID) | ORAL | 0 refills | Status: DC | PRN

## 2017-12-08 NOTE — Progress Notes (Signed)
He presents today for follow-up of his Jason Hooper bunion implant date of surgery July 16, 2017.  States that is still little bit tender but he is doing much better.  He states that the rainy weather seems to bother it more.  Objective: Vital signs are stable alert and oriented x3.  Pulses are strong and palpable.  Neurologic sensorium is intact.  He has great range of motion of the first metatarsophalangeal joint a little stiff on dorsiflexion but this will go ahead and and come through in the next few weeks to months.  No signs of skin breakdown.  Assessment: Well-healing surgical foot.  Plan: We will allow him to get back to his regular routine and he will follow-up with me with questions or concerns in the near future.

## 2017-12-22 ENCOUNTER — Encounter: Payer: Self-pay | Admitting: Family

## 2017-12-22 ENCOUNTER — Ambulatory Visit (INDEPENDENT_AMBULATORY_CARE_PROVIDER_SITE_OTHER): Payer: BLUE CROSS/BLUE SHIELD | Admitting: Family

## 2017-12-22 VITALS — BP 160/110 | HR 73 | Temp 98.6°F | Wt 207.6 lb

## 2017-12-22 DIAGNOSIS — M62838 Other muscle spasm: Secondary | ICD-10-CM | POA: Diagnosis not present

## 2017-12-22 DIAGNOSIS — K222 Esophageal obstruction: Secondary | ICD-10-CM | POA: Diagnosis not present

## 2017-12-22 DIAGNOSIS — I10 Essential (primary) hypertension: Secondary | ICD-10-CM

## 2017-12-22 DIAGNOSIS — F319 Bipolar disorder, unspecified: Secondary | ICD-10-CM | POA: Diagnosis not present

## 2017-12-22 DIAGNOSIS — Z8719 Personal history of other diseases of the digestive system: Secondary | ICD-10-CM

## 2017-12-22 MED ORDER — CYCLOBENZAPRINE HCL 5 MG PO TABS: 5.0000 mg | ORAL_TABLET | Freq: Every evening | ORAL | 0 refills | Status: DC | PRN

## 2017-12-22 MED ORDER — PROPRANOLOL HCL 40 MG PO TABS: 40.0000 mg | ORAL_TABLET | Freq: Two times a day (BID) | ORAL | 1 refills | Status: DC

## 2017-12-22 NOTE — Progress Notes (Signed)
Subjective:    [name], male    DOB: 04-15-1966, 52 y.o.   MRN: [id number]  CC: Jason Hooper is a 52 y.o. male who presents today for follow up, establish care.   HPI:  Overall feels well today.  Years like a referral to GI for EGD, colonoscopy.  H/o esophageal stricture and due for stretching. Hard to swallow if doesn't chew well.   Complains of back spasms one month ago. Occasionally needs flexeril, improves with flexeril, heat. Would take a night so not so stiff before. Occurs once every 3-4 months.  Triggered by weather , jumps out of trailer. No numbness, tingling. No injury.   Bipolar- not sure diagnosis accurate. Treated for bipolar, 2001.  Drinking alcohol, cocaine at that time. Going through divorce.  Does want something for anxiety as has dentist appointment on Friday   Drinks occasionally now. Doesn't feel need to cut back. Lives with mother who hasn't express concerns with alcohol use. States hospitalized at that time.  Has seen psychiatry in the past however doesn't recall name . No SI/HI. No hallucinations.   Elevated blood pressure. He states gets really high in doctors office. Gets anxious when coming to doctor. Not checking at home. Denies exertional chest pain or pressure, numbness or tingling radiating to left arm or jaw, palpitations, dizziness, frequent headaches, changes in vision, or shortness of breath.    Takes tramadol prn from Dr Milinda Pointer after recent surgery  GERD- occasionally takes prilosec with control.      2016 Hospital admission - alcohol withdrawal. Bipolar, mania with psychotic features Bipolar- had been lithium, lamictal, buspar, seroquel and trazodone; no longer on medications. States today that he didn't feel good on regimen.  HISTORY:  Past Medical History:  Diagnosis Date  . Alcohol abuse   . Allergy   . Bipolar disorder ([place])   . GERD (gastroesophageal reflux disease)   . Kidney stones 2014   Past Surgical History:    Procedure Laterality Date  . ESOPHAGEAL DILATION    . JOINT REPLACEMENT Right 07/16/2017   R great toe  . TONSILECTOMY/ADENOIDECTOMY WITH MYRINGOTOMY     Family History  Adopted: Yes  Family history unknown: Yes    Allergies: Patient has no known allergies. Current Outpatient Medications on File Prior to Visit  Medication Sig Dispense Refill  . omeprazole (PRILOSEC OTC) 20 MG tablet Take 1 tablet by mouth as needed.    . sildenafil (VIAGRA) 50 MG tablet Take 1 tablet (50 mg total) by mouth daily as needed for erectile dysfunction. 1 hour prior to sexual activity. 10 tablet 3  . traMADol (ULTRAM) 50 MG tablet Take 1 tablet (50 mg total) by mouth every 8 (eight) hours as needed. 30 tablet 0   No current facility-administered medications on file prior to visit.     Social History   Tobacco Use  . Smoking status: Never Smoker  . Smokeless tobacco: Former Systems developer    Types: Snuff  Substance Use Topics  . Alcohol use: Yes    Alcohol/week: 0.6 - 1.2 oz    Types: 1 - 2 Cans of beer per week    Comment: occasional  . Drug use: No    Review of Systems  Constitutional: Negative for chills and fever.  Respiratory: Negative for cough.   Cardiovascular: Negative for chest pain and palpitations.  Gastrointestinal: Negative for nausea and vomiting.  Psychiatric/Behavioral: Negative for sleep disturbance and suicidal ideas. The patient is nervous/anxious.  Objective:    BP (!) 160/110   Pulse 73   Temp 98.6 F (37 C) (Oral)   Wt 207 lb 9.6 oz (94.2 kg)   SpO2 98%   BMI 27.58 kg/m  BP Readings from Last 3 Encounters:  12/22/17 (!) 160/110  07/21/17 (!) 144/93  05/17/17 (!) 141/94   Wt Readings from Last 3 Encounters:  12/22/17 207 lb 9.6 oz (94.2 kg)  04/26/17 191 lb 6 oz (86.8 kg)  03/04/15 185 lb 3 oz (84 kg)    Physical Exam  Constitutional: He appears well-developed and well-nourished.  HENT:  Right Ear: Hearing normal.  Left Ear: Hearing normal.   Mouth/Throat: Uvula is midline, oropharynx is clear and moist and mucous membranes are normal. No posterior oropharyngeal edema or posterior oropharyngeal erythema.  Eyes: Conjunctivae, EOM and lids are normal. Pupils are equal, round, and reactive to light. Lids are everted and swept, no foreign bodies found.  Normal fundus bilaterally.  Cardiovascular: Regular rhythm and normal heart sounds.  Pulmonary/Chest: Effort normal and breath sounds normal. No respiratory distress. He has no wheezes. He has no rhonchi. He has no rales.  Musculoskeletal:       Thoracic back: He exhibits normal range of motion, no tenderness, no bony tenderness, no swelling, no pain and no spasm.  Lymphadenopathy:       Head (right side): No submental, no submandibular, no tonsillar, no preauricular, no posterior auricular and no occipital adenopathy present.       Head (left side): No submental, no submandibular, no tonsillar, no preauricular, no posterior auricular and no occipital adenopathy present.    He has no cervical adenopathy.  Neurological: He is alert. He has normal strength. No cranial nerve deficit or sensory deficit. He displays a negative Romberg sign.  Reflex Scores:      Bicep reflexes are 2+ on the right side and 2+ on the left side.      Patellar reflexes are 2+ on the right side and 2+ on the left side. Grip equal and strong bilateral upper extremities. Gait strong and steady. Able to perform rapid alternating movement without difficulty.  Skin: Skin is warm and dry.  Psychiatric: He has a normal mood and affect. His speech is normal and behavior is normal.  Vitals reviewed.      Assessment & Plan:   Problem List Items Addressed This Visit      Cardiovascular and Mediastinum   HTN (hypertension) - Primary    Elevated today, improved after rested in room..  Reassured by normal neurologic exam.  Patient has been on propranolol in the past for hypertension.  He is no longer on any medication.   Will start propranololl today.  Very close follow-up and advised patient to purchase blood pressure cuff.      Relevant Medications   propranolol (INDERAL) 40 MG tablet     Digestive   Esophageal stricture    Chronic.  Referral placed to GI for EGD, colonoscopy, further evaluation of stricture.        Other   Bipolar disorder [facility])    Patient denies any mania symptoms at this time.  He feels that was a long time ago and that his alcohol intake is not worrisome.  I strongly advised him to seek counseling and let me refer him to psychiatry or even a counselor.  He was polite and declines at at this time. Discussed with a history of psychosis such as mania, he really should be under the  care of psychiatry.  At this time,  I am concerned about his blood pressure, propanolol may help with blood pressure and anxiety to some degree.  Patient was in agreement with this.  Advise close follow-up.      Muscle spasm    No pain today.  Have given patient 30 tabs of Flexeril to use at his discretion.  I advised him to use sparingly for occasional muscle spasm.  I have asked him to abstain from alcohol while on this medication and not to drive.  He verbalized understanding       Other Visit Diagnoses    History of esophageal stricture       Relevant Orders   Ambulatory referral to Gastroenterology       I have discontinued [name]. Rufus "Michael"'s oxyCODONE-acetaminophen, promethazine, and diclofenac. I am also having him start on propranolol and cyclobenzaprine. Additionally, I am having him maintain his sildenafil, traMADol, and omeprazole.   Meds ordered this encounter  Medications  . propranolol (INDERAL) 40 MG tablet    Sig: Take 1 tablet (40 mg total) by mouth 2 (two) times daily.    Dispense:  60 tablet    Refill:  1  . cyclobenzaprine (FLEXERIL) 5 MG tablet    Sig: Take 1 tablet (5 mg total) by mouth at bedtime as needed for muscle spasms.    Dispense:  30 tablet    Refill:  0     Return precautions given.   Risks, benefits, and alternatives of the medications and treatment plan prescribed today were discussed, and patient expressed understanding.   Education regarding symptom management and diagnosis given to patient on AVS.  Continue to follow with Burnard Hawthorne, FNP for routine health maintenance.   [name] and I agreed with plan.   [name], FNP

## 2017-12-22 NOTE — Patient Instructions (Addendum)
Referral to colonoscopy/egd  Speak with pharmacist about shingrex - ask if insurance will cover about   Start back on propranolol  Monitor blood pressure,  Goal is less than 130/80; if persistently higher, please make sooner follow up appointment so we can recheck you blood pressure and manage medications  Flexeril as needed  Do not drive or operate heavy machinery while on muscle relaxant. Please do not drink alcohol. Only take this medication as needed for acute muscle spasm at bedtime. This medication make you feel drowsy so be very careful.  Stop taking if become too drowsy or somnolent as this puts you at risk for falls. Please contact our office with any questions.   Come back  In 2 weeks  BMP,

## 2017-12-24 ENCOUNTER — Telehealth: Payer: Self-pay | Admitting: Family

## 2017-12-24 DIAGNOSIS — K222 Esophageal obstruction: Secondary | ICD-10-CM | POA: Insufficient documentation

## 2017-12-24 DIAGNOSIS — M62838 Other muscle spasm: Secondary | ICD-10-CM | POA: Insufficient documentation

## 2017-12-24 DIAGNOSIS — I10 Essential (primary) hypertension: Secondary | ICD-10-CM | POA: Insufficient documentation

## 2017-12-24 NOTE — Assessment & Plan Note (Signed)
Patient denies any mania symptoms at this time.  He feels that was a long time ago and that his alcohol intake is not worrisome.  I strongly advised him to seek counseling and let me refer him to psychiatry or even a counselor.  He was polite and declines at at this time. Discussed with a history of psychosis such as mania, he really should be under the care of psychiatry.  At this time,  I am concerned about his blood pressure, propanolol may help with blood pressure and anxiety to some degree.  Patient was in agreement with this.  Advise close follow-up.

## 2017-12-24 NOTE — Assessment & Plan Note (Signed)
No pain today.  Have given patient 30 tabs of Flexeril to use at his discretion.  I advised him to use sparingly for occasional muscle spasm.  I have asked him to abstain from alcohol while on this medication and not to drive.  He verbalized understanding

## 2017-12-24 NOTE — Assessment & Plan Note (Signed)
Chronic.  Referral placed to GI for EGD, colonoscopy, further evaluation of stricture.

## 2017-12-24 NOTE — Telephone Encounter (Signed)
Pt states "it's going down" he had a dentist appointment today and his BP was 118/70.

## 2017-12-24 NOTE — Telephone Encounter (Signed)
noted 

## 2017-12-24 NOTE — Assessment & Plan Note (Addendum)
Elevated today, improved after rested in room..  Reassured by normal neurologic exam.  Patient has been on propranolol in the past for hypertension.  He is no longer on any medication.  Will start propranololl today.  Very close follow-up and advised patient to purchase blood pressure cuff.

## 2017-12-24 NOTE — Telephone Encounter (Signed)
Would you call pt and see if BP has improved on propranolol? Started it weds.

## 2018-01-12 ENCOUNTER — Encounter: Payer: Self-pay | Admitting: Family

## 2018-01-12 ENCOUNTER — Ambulatory Visit (INDEPENDENT_AMBULATORY_CARE_PROVIDER_SITE_OTHER): Payer: BLUE CROSS/BLUE SHIELD | Admitting: Family

## 2018-01-12 VITALS — BP 144/100 | HR 66 | Temp 98.3°F | Resp 16 | Wt 207.5 lb

## 2018-01-12 DIAGNOSIS — I10 Essential (primary) hypertension: Secondary | ICD-10-CM

## 2018-01-12 DIAGNOSIS — F319 Bipolar disorder, unspecified: Secondary | ICD-10-CM

## 2018-01-12 MED ORDER — AMLODIPINE BESYLATE 2.5 MG PO TABS: 2.5000 mg | ORAL_TABLET | Freq: Every day | ORAL | 3 refills | Status: DC

## 2018-01-12 NOTE — Progress Notes (Signed)
Subjective:    [name], male    DOB: [birth year], 52 y.o.   MRN: [id number]  CC: [name] is a 52 y.o. male who presents today for follow up.   HPI: HTN-  Reports 130/92 at home. Started propranolol at last visit. Plans to stop drinking soda. Walks 7 miles a day.  Anxiety unchanged, hasn't noticed difference on propranolol. States  Mood is stable. Again, he has no desire to be on medications at this time.       HISTORY:  Past Medical History:  Diagnosis Date  . Alcohol abuse   . Allergy   . Bipolar disorder ([place])   . GERD (gastroesophageal reflux disease)   . Kidney stones 2014   Past Surgical History:  Procedure Laterality Date  . ESOPHAGEAL DILATION    . JOINT REPLACEMENT Right 07/16/2017   R great toe  . TONSILECTOMY/ADENOIDECTOMY WITH MYRINGOTOMY     Family History  Adopted: Yes  Family history unknown: Yes    Allergies: Patient has no known allergies. Current Outpatient Medications on File Prior to Visit  Medication Sig Dispense Refill  . cyclobenzaprine (FLEXERIL) 5 MG tablet Take 1 tablet (5 mg total) by mouth at bedtime as needed for muscle spasms. 30 tablet 0  . omeprazole (PRILOSEC OTC) 20 MG tablet Take 1 tablet by mouth as needed.    . propranolol (INDERAL) 40 MG tablet Take 1 tablet (40 mg total) by mouth 2 (two) times daily. 60 tablet 1  . sildenafil (VIAGRA) 50 MG tablet Take 1 tablet (50 mg total) by mouth daily as needed for erectile dysfunction. 1 hour prior to sexual activity. 10 tablet 3  . traMADol (ULTRAM) 50 MG tablet Take 1 tablet (50 mg total) by mouth every 8 (eight) hours as needed. 30 tablet 0   No current facility-administered medications on file prior to visit.     Social History   Tobacco Use  . Smoking status: Never Smoker  . Smokeless tobacco: Former Systems developer    Types: Snuff  Substance Use Topics  . Alcohol use: Yes    Alcohol/week: 0.6 - 1.2 oz    Types: 1 - 2 Cans of beer per week    Comment: occasional  .  Drug use: No    Review of Systems  Constitutional: Negative for chills and fever.  Respiratory: Negative for cough.   Cardiovascular: Negative for chest pain and palpitations.  Gastrointestinal: Negative for nausea and vomiting.      Objective:    BP (!) 144/100   Pulse 66   Temp 98.3 F (36.8 C) (Oral)   Resp 16   Wt 207 lb 8 oz (94.1 kg)   SpO2 99%   BMI 27.56 kg/m  BP Readings from Last 3 Encounters:  01/12/18 (!) 144/100  12/22/17 (!) 160/110  07/21/17 (!) 144/93   Wt Readings from Last 3 Encounters:  01/12/18 207 lb 8 oz (94.1 kg)  12/22/17 207 lb 9.6 oz (94.2 kg)  04/26/17 191 lb 6 oz (86.8 kg)    Physical Exam  Constitutional: He appears well-developed and well-nourished.  Cardiovascular: Regular rhythm and normal heart sounds.  Pulmonary/Chest: Effort normal and breath sounds normal. No respiratory distress. He has no wheezes. He has no rhonchi. He has no rales.  Neurological: He is alert.  Skin: Skin is warm and dry.  Psychiatric: He has a normal mood and affect. His speech is normal and behavior is normal.  Vitals reviewed.  Assessment & Plan:   Problem List Items Addressed This Visit      Cardiovascular and Mediastinum   HTN (hypertension) - Primary    Blood pressure remains elevated.  Although improved on propranolol.  We will go ahead and start low-dose amlodipine and titrate from there.  Patient will monitor blood pressure at home and let me know if not at goal. Of note, patient will return for physical      Relevant Medications   amLODipine (NORVASC) 2.5 MG tablet     Other   Bipolar disorder ([place])    I offered support today in the context of his mental health history.  He feels stable today politely declines medication at this time.  He will let me know if this changes and emphasized to let me know of any changes in his mood.          I am having [name]. Auzenne "[name]" start on amLODipine. I am also having him maintain his  sildenafil, traMADol, omeprazole, propranolol, and cyclobenzaprine.   Meds ordered this encounter  Medications  . amLODipine (NORVASC) 2.5 MG tablet    Sig: Take 1 tablet (2.5 mg total) by mouth daily.    Dispense:  90 tablet    Refill:  3    Order Specific Question:   Supervising Provider    Answer:   Crecencio Mc [[id number]]    Return precautions given.   Risks, benefits, and alternatives of the medications and treatment plan prescribed today were discussed, and patient expressed understanding.   Education regarding symptom management and diagnosis given to patient on AVS.  Continue to follow with Burnard Hawthorne, FNP for routine health maintenance.   [name] and I agreed with plan.   [name], FNP

## 2018-01-12 NOTE — Assessment & Plan Note (Addendum)
Blood pressure remains elevated.  Although improved on propranolol.  We will go ahead and start low-dose amlodipine and titrate from there.  Patient will monitor blood pressure at home and let me know if not at goal. Of note, patient will return for physical

## 2018-01-12 NOTE — Patient Instructions (Addendum)
Blood pressure is improving however still not at goal.  We will start a low-dose amlodipine.  Please continue to monitor blood pressure with a goal of 130/80 .    please make next appointment for your physical on a day when you are fasting so we can do your cholesterol panel as well

## 2018-01-12 NOTE — Assessment & Plan Note (Signed)
I offered support today in the context of his mental health history.  He feels stable today politely declines medication at this time.  He will let me know if this changes and emphasized to let me know of any changes in his mood.

## 2018-01-13 ENCOUNTER — Ambulatory Visit (INDEPENDENT_AMBULATORY_CARE_PROVIDER_SITE_OTHER): Payer: BLUE CROSS/BLUE SHIELD | Admitting: Gastroenterology

## 2018-01-13 ENCOUNTER — Other Ambulatory Visit: Payer: Self-pay | Admitting: Family

## 2018-01-13 ENCOUNTER — Encounter: Payer: Self-pay | Admitting: Gastroenterology

## 2018-01-13 VITALS — BP 151/98 | HR 66 | Ht 72.0 in | Wt 204.0 lb

## 2018-01-13 DIAGNOSIS — K219 Gastro-esophageal reflux disease without esophagitis: Secondary | ICD-10-CM

## 2018-01-13 DIAGNOSIS — Z1211 Encounter for screening for malignant neoplasm of colon: Secondary | ICD-10-CM | POA: Diagnosis not present

## 2018-01-13 DIAGNOSIS — R1310 Dysphagia, unspecified: Secondary | ICD-10-CM

## 2018-01-13 MED ORDER — OMEPRAZOLE 40 MG PO CPDR: 40.0000 mg | DELAYED_RELEASE_CAPSULE | Freq: Every day | ORAL | 3 refills | Status: DC

## 2018-01-13 MED ORDER — PEG 3350-KCL-NA BICARB-NACL 420 G PO SOLR: 4000.0000 mL | Freq: Once | ORAL | 0 refills | Status: AC

## 2018-01-13 NOTE — Progress Notes (Signed)
Jason Bellows MD, MRCP(U.K) [address]  [place]  [place], [place] [address]  Main: [phone]  Fax: [phone]   Gastroenterology Consultation  Referring Provider:     Burnard Hawthorne, FNP Primary Care Physician:  Burnard Hawthorne, FNP Primary Gastroenterologist:  Jason Hooper  Reason for Consultation:     Esophageal stricture and colon cancer screening         HPI:   Jason Hooper is a 52 y.o. y/o male referred for consultation & management  by Dr. Vidal Schwalbe, Yvetta Coder, FNP.     Dysphagia: Onset and any progression: 8 years - esophagus streched 6 years back , presently 1-2 times a week  Frequency: above  Foods affected : mostly solids Prior episodes of impaction: no  History of asthma/allergy : no  History of heartburn/Reflux : yes  Weight loss/weight gain : no   Prior EGD: yes  PPI/H2 blocker use : omeprazole - as needed - 1 every other day .   Never had a colonoscopy , no family history of colon cancr or polyps, no change in bowel habits, no weight loss. No rectal bleeding . No blood thinners usage.      Past Medical History:  Diagnosis Date  . Alcohol abuse   . Allergy   . Bipolar disorder ([place])   . GERD (gastroesophageal reflux disease)   . Kidney stones 2014    Past Surgical History:  Procedure Laterality Date  . ESOPHAGEAL DILATION    . JOINT REPLACEMENT Right 07/16/2017   R great toe  . TONSILECTOMY/ADENOIDECTOMY WITH MYRINGOTOMY      Prior to Admission medications   Medication Sig Start Date End Date Taking? Authorizing Provider  amLODipine (NORVASC) 2.5 MG tablet Take 1 tablet (2.5 mg total) by mouth daily. 01/12/18  Yes Arnett, Yvetta Coder, FNP  cyclobenzaprine (FLEXERIL) 5 MG tablet Take 1 tablet (5 mg total) by mouth at bedtime as needed for muscle spasms. 12/22/17  Yes Arnett, Yvetta Coder, FNP  omeprazole (PRILOSEC OTC) 20 MG tablet Take 1 tablet by mouth as needed. 03/02/16  Yes [provider]  propranolol (INDERAL) 40 MG  tablet Take 1 tablet (40 mg total) by mouth 2 (two) times daily. 12/22/17  Yes Burnard Hawthorne, FNP  sildenafil (VIAGRA) 50 MG tablet Take 1 tablet (50 mg total) by mouth daily as needed for erectile dysfunction. 1 hour prior to sexual activity. 04/26/17  Yes Cook, Jayce G, DO  traMADol (ULTRAM) 50 MG tablet Take 1 tablet (50 mg total) by mouth every 8 (eight) hours as needed. 12/08/17  Yes Hyatt, Max T, DPM    Family History  Adopted: Yes  Family history unknown: Yes     Social History   Tobacco Use  . Smoking status: Never Smoker  . Smokeless tobacco: Former Systems developer    Types: Snuff  Substance Use Topics  . Alcohol use: Yes    Alcohol/week: 0.6 - 1.2 oz    Types: 1 - 2 Cans of beer per week    Comment: occasional  . Drug use: No    Allergies as of 01/13/2018  . (No Known Allergies)    Review of Systems:    All systems reviewed and negative except where noted in HPI.   Physical Exam:  BP (!) 151/98 (BP Location: Left Arm, Patient Position: Sitting, Cuff Size: Normal)   Pulse 66   Ht 6' (1.829 m)   Wt 204 lb (92.5 kg)   BMI 27.67 kg/m  No LMP for male patient. Psych:  Alert and cooperative. Normal mood and affect. General:   Alert,  Well-developed, well-nourished, pleasant and cooperative in NAD Head:  Normocephalic and atraumatic. Eyes:  Sclera clear, no icterus.   Conjunctiva pink. Ears:  Normal auditory acuity. Nose:  No deformity, discharge, or lesions. Mouth:  No deformity or lesions,oropharynx pink & moist. Neck:  Supple; no masses or thyromegaly. Lungs:  Respirations even and unlabored.  Clear throughout to auscultation.   No wheezes, crackles, or rhonchi. No acute distress. Heart:  Regular rate and rhythm; no murmurs, clicks, rubs, or gallops. Abdomen:  Normal bowel sounds.  No bruits.  Soft, non-tender and non-distended without masses, hepatosplenomegaly or hernias noted.  No guarding or rebound tenderness.    Extremities:  No clubbing or edema.  No  cyanosis. Neurologic:  Alert and oriented x3;  grossly normal neurologically. Skin:  Intact without significant lesions or rashes. No jaundice. Lymph Nodes:  No significant cervical adenopathy. Psych:  Alert and cooperative. Normal mood and affect.  Imaging Studies: No results found.  Assessment and Plan:   Jason Hooper is a 52 y.o. y/o male has been referred for dysphagia. Likely a peptic stricture. H/o GERD. Also due for colonoscopy average risk cancer screening   Plan  1. EGD+ colonoscopy  2. Prilosec 40 mg once a day  3. GERD. Counseled on life style changes, suggest to use PPI first thing in the morning on empty stomach and eat 30 minutes after. Advised on the use of a wedge pillow at night , avoid meals for 2 hours prior to bed time. Weight loss  Follow up in 3 months   Dr Jason Bellows MD,MRCP(U.K)

## 2018-01-13 NOTE — Addendum Note (Signed)
Addended by: Peggye Ley on: 01/13/2018 03:05 PM   Modules accepted: Orders, SmartSet

## 2018-01-13 NOTE — Progress Notes (Signed)
Patient states he has the occasional choking.

## 2018-01-17 ENCOUNTER — Telehealth: Payer: Self-pay | Admitting: Gastroenterology

## 2018-01-17 NOTE — Telephone Encounter (Signed)
Patient needs procedure instructions. He stated they didn't show up on my chart. Please call

## 2018-01-24 ENCOUNTER — Ambulatory Visit: Payer: BLUE CROSS/BLUE SHIELD | Admitting: Anesthesiology

## 2018-01-24 ENCOUNTER — Ambulatory Visit
Admission: RE | Admit: 2018-01-24 | Discharge: 2018-01-24 | Disposition: A | Discharge status: Home / Self Care | Payer: BLUE CROSS/BLUE SHIELD | Source: Ambulatory Visit | Attending: Gastroenterology | Admitting: Gastroenterology

## 2018-01-24 ENCOUNTER — Encounter
Admission: RE | Disposition: A | Discharge status: Home / Self Care | Payer: Self-pay | Source: Ambulatory Visit | Attending: Gastroenterology

## 2018-01-24 ENCOUNTER — Encounter: Payer: Self-pay | Admitting: Anesthesiology

## 2018-01-24 DIAGNOSIS — R1310 Dysphagia, unspecified: Secondary | ICD-10-CM | POA: Diagnosis not present

## 2018-01-24 DIAGNOSIS — Z96698 Presence of other orthopedic joint implants: Secondary | ICD-10-CM | POA: Insufficient documentation

## 2018-01-24 DIAGNOSIS — K222 Esophageal obstruction: Secondary | ICD-10-CM

## 2018-01-24 DIAGNOSIS — K5790 Diverticulosis of intestine, part unspecified, without perforation or abscess without bleeding: Secondary | ICD-10-CM | POA: Diagnosis not present

## 2018-01-24 DIAGNOSIS — Z1211 Encounter for screening for malignant neoplasm of colon: Secondary | ICD-10-CM | POA: Diagnosis not present

## 2018-01-24 DIAGNOSIS — K219 Gastro-esophageal reflux disease without esophagitis: Secondary | ICD-10-CM | POA: Insufficient documentation

## 2018-01-24 DIAGNOSIS — Z87442 Personal history of urinary calculi: Secondary | ICD-10-CM | POA: Diagnosis not present

## 2018-01-24 DIAGNOSIS — Z79899 Other long term (current) drug therapy: Secondary | ICD-10-CM | POA: Diagnosis not present

## 2018-01-24 DIAGNOSIS — K2950 Unspecified chronic gastritis without bleeding: Secondary | ICD-10-CM | POA: Diagnosis not present

## 2018-01-24 DIAGNOSIS — I10 Essential (primary) hypertension: Secondary | ICD-10-CM | POA: Insufficient documentation

## 2018-01-24 DIAGNOSIS — F319 Bipolar disorder, unspecified: Secondary | ICD-10-CM | POA: Diagnosis not present

## 2018-01-24 DIAGNOSIS — K2970 Gastritis, unspecified, without bleeding: Secondary | ICD-10-CM | POA: Diagnosis not present

## 2018-01-24 DIAGNOSIS — M1990 Unspecified osteoarthritis, unspecified site: Secondary | ICD-10-CM | POA: Insufficient documentation

## 2018-01-24 HISTORY — PX: ESOPHAGOGASTRODUODENOSCOPY (EGD) WITH PROPOFOL: SHX5813

## 2018-01-24 HISTORY — PX: COLONOSCOPY WITH PROPOFOL: SHX5780

## 2018-01-24 SURGERY — ESOPHAGOGASTRODUODENOSCOPY (EGD) WITH PROPOFOL
Anesthesia: General

## 2018-01-24 MED ORDER — SODIUM CHLORIDE 0.9 % IV SOLN
INTRAVENOUS | Status: DC
  Administered 2018-01-24: 1000 mL via INTRAVENOUS

## 2018-01-24 MED ORDER — FENTANYL CITRATE (PF) 100 MCG/2ML IJ SOLN
INTRAMUSCULAR | Status: AC
  Filled 2018-01-24: qty 2

## 2018-01-24 MED ORDER — LIDOCAINE HCL (PF) 1 % IJ SOLN
INTRAMUSCULAR | Status: AC
  Administered 2018-01-24: 0.3 mL
  Filled 2018-01-24: qty 2

## 2018-01-24 MED ORDER — PROPOFOL 500 MG/50ML IV EMUL
INTRAVENOUS | Status: DC | PRN
  Administered 2018-01-24: 150 ug/kg/min via INTRAVENOUS

## 2018-01-24 MED ORDER — FENTANYL CITRATE (PF) 100 MCG/2ML IJ SOLN
INTRAMUSCULAR | Status: DC | PRN
  Administered 2018-01-24: 100 ug via INTRAVENOUS

## 2018-01-24 MED ORDER — PROPOFOL 10 MG/ML IV BOLUS
INTRAVENOUS | Status: DC | PRN
  Administered 2018-01-24: 100 mg via INTRAVENOUS

## 2018-01-24 MED ORDER — PROPOFOL 500 MG/50ML IV EMUL
INTRAVENOUS | Status: AC
  Filled 2018-01-24: qty 50

## 2018-01-24 NOTE — Anesthesia Post-op Follow-up Note (Signed)
Anesthesia QCDR form completed.        

## 2018-01-24 NOTE — H&P (Signed)
Jason Bellows, MD [address], [place], [place], [place], [address] [address], [place], [place], [place], [address] Phone: [phone]  Fax: [phone]  Primary Care Physician:  Burnard Hawthorne, FNP   Pre-Procedure History & Physical: HPI:  Jason Hooper is a 52 y.o. male is here for an endoscopy and colonoscopy    Past Medical History:  Diagnosis Date  . Alcohol abuse   . Allergy   . Bipolar disorder ([place])   . GERD (gastroesophageal reflux disease)   . Kidney stones 2014    Past Surgical History:  Procedure Laterality Date  . ESOPHAGEAL DILATION    . JOINT REPLACEMENT Right 07/16/2017   R great toe  . TONSILECTOMY/ADENOIDECTOMY WITH MYRINGOTOMY      Prior to Admission medications   Medication Sig Start Date End Date Taking? Authorizing Provider  amLODipine (NORVASC) 2.5 MG tablet Take 1 tablet (2.5 mg total) by mouth daily. 01/12/18   Burnard Hawthorne, FNP  cyclobenzaprine (FLEXERIL) 5 MG tablet Take 1 tablet (5 mg total) by mouth at bedtime as needed for muscle spasms. 12/22/17   Burnard Hawthorne, FNP  omeprazole (PRILOSEC OTC) 20 MG tablet Take 1 tablet by mouth as needed. 03/02/16   [provider]  omeprazole (PRILOSEC) 40 MG capsule Take 1 capsule (40 mg total) by mouth daily. 01/13/18   Jason Bellows, MD  propranolol (INDERAL) 40 MG tablet TAKE 1 TABLET BY MOUTH TWICE A DAY 01/13/18   Burnard Hawthorne, FNP  sildenafil (VIAGRA) 50 MG tablet Take 1 tablet (50 mg total) by mouth daily as needed for erectile dysfunction. 1 hour prior to sexual activity. 04/26/17   Coral Spikes, DO  traMADol (ULTRAM) 50 MG tablet Take 1 tablet (50 mg total) by mouth every 8 (eight) hours as needed. 12/08/17   Hyatt, Max T, DPM    Allergies as of 01/13/2018  . (No Known Allergies)    Family History  Adopted: Yes  Family history unknown: Yes    Social History   Socioeconomic History  . Marital status: Single    Spouse name: Not on file  . Number of children:  Not on file  . Years of education: Not on file  . Highest education level: Not on file  Occupational History  . Not on file  Social Needs  . Financial resource strain: Not on file  . Food insecurity:    Worry: Not on file    Inability: Not on file  . Transportation needs:    Medical: Not on file    Non-medical: Not on file  Tobacco Use  . Smoking status: Never Smoker  . Smokeless tobacco: Former Systems developer    Types: Snuff  Substance and Sexual Activity  . Alcohol use: Yes    Alcohol/week: 0.6 - 1.2 oz    Types: 1 - 2 Cans of beer per week    Comment: occasional  . Drug use: No  . Sexual activity: Yes  Lifestyle  . Physical activity:    Days per week: Not on file    Minutes per session: Not on file  . Stress: Not on file  Relationships  . Social connections:    Talks on phone: Not on file    Gets together: Not on file    Attends religious service: Not on file    Active member of club or organization: Not on file    Attends meetings of clubs or organizations: Not on file  Relationship status: Not on file  . Intimate partner violence:    Fear of current or ex partner: Not on file    Emotionally abused: Not on file    Physically abused: Not on file    Forced sexual activity: Not on file  Other Topics Concern  . Not on file  Social History Narrative   Adopted.     Review of Systems: See HPI, otherwise negative ROS  Physical Exam: BP (!) 135/98   Pulse 65   Temp (!) 96.9 F (36.1 C) (Tympanic)   Resp 16   Ht 6' (1.829 m)   Wt 200 lb (90.7 kg)   SpO2 100%   BMI 27.12 kg/m  General:   Alert,  pleasant and cooperative in NAD Head:  Normocephalic and atraumatic. Neck:  Supple; no masses or thyromegaly. Lungs:  Clear throughout to auscultation, normal respiratory effort.    Heart:  +S1, +S2, Regular rate and rhythm, No edema. Abdomen:  Soft, nontender and nondistended. Normal bowel sounds, without guarding, and without rebound.   Neurologic:  Alert and  oriented  x4;  grossly normal neurologically.  Impression/Plan: Jason Hooper is here for an endoscopy and colonoscopy  to be performed for  evaluation of dysphagia and colon cancer screening     Risks, benefits, limitations, and alternatives regarding endoscopy have been reviewed with the patient.  Questions have been answered.  All parties agreeable.   Jason Bellows, MD  01/24/2018, 11:41 AM

## 2018-01-24 NOTE — Anesthesia Preprocedure Evaluation (Signed)
Anesthesia Evaluation  Patient identified by MRN, date of birth, ID band Patient awake    Reviewed: Allergy & Precautions, NPO status , Patient's Chart, lab work & pertinent test results, reviewed documented beta blocker date and time   Airway Mallampati: II  TM Distance: >3 FB     Dental  (+) Chipped   Pulmonary           Cardiovascular hypertension, Pt. on medications and Pt. on home beta blockers      Neuro/Psych PSYCHIATRIC DISORDERS Bipolar Disorder    GI/Hepatic GERD  ,  Endo/Other    Renal/GU Renal disease     Musculoskeletal  (+) Arthritis ,   Abdominal   Peds  Hematology   Anesthesia Other Findings ETOH.  Reproductive/Obstetrics                             Anesthesia Physical Anesthesia Plan  ASA: III  Anesthesia Plan: General   Post-op Pain Management:    Induction: Intravenous  PONV Risk Score and Plan:   Airway Management Planned:   Additional Equipment:   Intra-op Plan:   Post-operative Plan:   Informed Consent: I have reviewed the patients History and Physical, chart, labs and discussed the procedure including the risks, benefits and alternatives for the proposed anesthesia with the patient or authorized representative who has indicated his/her understanding and acceptance.     Plan Discussed with: CRNA  Anesthesia Plan Comments:         Anesthesia Quick Evaluation

## 2018-01-24 NOTE — Transfer of Care (Signed)
Immediate Anesthesia Transfer of Care Note  Patient: Jason Hooper  Procedure(s) Performed: ESOPHAGOGASTRODUODENOSCOPY (EGD) WITH PROPOFOL (N/A ) COLONOSCOPY WITH PROPOFOL (N/A )  Patient Location: PACU and Endoscopy Unit  Anesthesia Type:General  Level of Consciousness: awake, alert  and oriented  Airway & Oxygen Therapy: Patient Spontanous Breathing and Patient connected to nasal cannula oxygen  Post-op Assessment: Report given to RN and Post -op Vital signs reviewed and stable  Post vital signs: Reviewed and stable  Last Vitals:  Vitals Value Taken Time  BP 105/69 01/24/2018 12:19 PM  Temp 36.1 C 01/24/2018 12:19 PM  Pulse 65 01/24/2018 12:21 PM  Resp 15 01/24/2018 12:21 PM  SpO2 97 % 01/24/2018 12:21 PM  Vitals shown include unvalidated device data.  Last Pain:  Vitals:   01/24/18 1219  TempSrc: Tympanic  PainSc:          Complications: No apparent anesthesia complications

## 2018-01-24 NOTE — Op Note (Signed)
[facility] Gastroenterology Patient Name: Jason Hooper Procedure Date: 01/24/2018 11:44 AM MRN: [id number] Account #: [id number] Date of Birth: January 09, 1966 Admit Type: Outpatient Age: 52 Room: [facility] ENDO ROOM 4 Gender: Male Note Status: Finalized Procedure:            Colonoscopy Indications:          Screening for colorectal malignant neoplasm Providers:            [name] MD, MD Medicines:            Monitored Anesthesia Care Complications:        No immediate complications. Procedure:            Pre-Anesthesia Assessment:                       - Prior to the procedure, a History and Physical was                        performed, and patient medications, allergies and                        sensitivities were reviewed. The patient's tolerance of                        previous anesthesia was reviewed.                       - The risks and benefits of the procedure and the                        sedation options and risks were discussed with the                        patient. All questions were answered and informed                        consent was obtained.                       - ASA Grade Assessment: II - A patient with mild                        systemic disease.                       After obtaining informed consent, the colonoscope was                        passed under direct vision. Throughout the procedure,                        the patient's blood pressure, pulse, and oxygen                        saturations were monitored continuously. The                        Colonoscope was introduced through the anus and                        advanced to the the cecum, identified by the  appendiceal orifice, IC valve and transillumination.                        The colonoscopy was performed with ease. The patient                        tolerated the procedure well. The quality of the bowel                        preparation was  poor. Findings:      The perianal and digital rectal examinations were normal.      A few small-mouthed diverticula were found in the sigmoid colon.      Stool was found in the entire colon. Impression:           - Preparation of the colon was poor.                       - Diverticulosis in the sigmoid colon.                       - Stool in the entire examined colon.                       - No specimens collected. Recommendation:       - Discharge patient to home (with escort).                       - Resume previous diet.                       - Continue present medications.                       - Repeat colonoscopy in 2 weeks because the bowel                        preparation was suboptimal. Procedure Code(s):    --- Professional ---                       [phone], Colonoscopy, flexible; diagnostic, including                        collection of specimen(s) by brushing or washing, when                        performed (separate procedure) Diagnosis Code(s):    --- Professional ---                       Z12.11, Encounter for screening for malignant neoplasm                        of colon                       K57.30, Diverticulosis of large intestine without                        perforation or abscess without bleeding CPT copyright 2017 American Medical Association. All rights reserved. The codes documented in this report are preliminary and upon coder review may  be revised to meet current compliance  requirements. [name], MD [name] MD, MD 01/24/2018 12:17:42 PM This report has been signed electronically. Number of Addenda: 0 Note Initiated On: 01/24/2018 11:44 AM Scope Withdrawal Time: 0 hours 9 minutes 26 seconds  Total Procedure Duration: 0 hours 11 minutes 29 seconds       [facility]

## 2018-01-24 NOTE — Anesthesia Postprocedure Evaluation (Signed)
Anesthesia Post Note  Patient: Jason Hooper  Procedure(s) Performed: ESOPHAGOGASTRODUODENOSCOPY (EGD) WITH PROPOFOL (N/A ) COLONOSCOPY WITH PROPOFOL (N/A )  Patient location during evaluation: Endoscopy Anesthesia Type: General Level of consciousness: awake and alert Pain management: pain level controlled Vital Signs Assessment: post-procedure vital signs reviewed and stable Respiratory status: spontaneous breathing, nonlabored ventilation, respiratory function stable and patient connected to nasal cannula oxygen Cardiovascular status: blood pressure returned to baseline and stable Postop Assessment: no apparent nausea or vomiting Anesthetic complications: no     Last Vitals:  Vitals:   01/24/18 1034 01/24/18 1219  BP: (!) 135/98 105/69  Pulse: 65   Resp: 16   Temp: (!) 36.1 C (!) 36.1 C  SpO2: 100%     Last Pain:  Vitals:   01/24/18 1229  TempSrc:   PainSc: 0-No pain                 Tilman Mcclaren S

## 2018-01-24 NOTE — Op Note (Signed)
[facility] Gastroenterology Patient Name: Jason Hooper Procedure Date: 01/24/2018 11:45 AM MRN: [id number] Account #: [id number] Date of Birth: Jun 22, 1966 Admit Type: Outpatient Age: 52 Room: [facility] ENDO ROOM 4 Gender: Male Note Status: Finalized Procedure:            Upper GI endoscopy Indications:          Dysphagia Providers:            [name] MD, MD Referring MD:         Yvetta Coder. Arnett (Referring MD) Medicines:            Monitored Anesthesia Care Complications:        No immediate complications. Procedure:            Pre-Anesthesia Assessment:                       - Prior to the procedure, a History and Physical was                        performed, and patient medications, allergies and                        sensitivities were reviewed. The patient's tolerance of                        previous anesthesia was reviewed.                       - The risks and benefits of the procedure and the                        sedation options and risks were discussed with the                        patient. All questions were answered and informed                        consent was obtained.                       - ASA Grade Assessment: II - A patient with mild                        systemic disease.                       After obtaining informed consent, the endoscope was                        passed under direct vision. Throughout the procedure,                        the patient's blood pressure, pulse, and oxygen                        saturations were monitored continuously. The Endoscope                        was introduced through the mouth, and advanced to the  third part of duodenum. The upper GI endoscopy was                        accomplished with ease. The patient tolerated the                        procedure well. Findings:      The examined duodenum was normal.      Localized moderate inflammation characterized by  congestion (edema),       erosions and erythema was found in the gastric antrum. Biopsies were       taken with a cold forceps for histology.      One benign-appearing, intrinsic moderate stenosis was found at the       gastroesophageal junction. This stenosis measured 1.3 cm (inner       diameter) x less than one cm (in length). The stenosis was traversed. A       TTS dilator was passed through the scope. Dilation with a 12-13.5-15 mm       balloon dilator was performed to 15 mm. The dilation site was examined       and showed moderate mucosal disruption.      The cardia and gastric fundus were normal on retroflexion. Impression:           - Normal examined duodenum.                       - Gastritis. Biopsied.                       - Benign-appearing esophageal stenosis. Dilated. Recommendation:       - Discharge patient to home.                       - Continue present medications.                       - Perform a colonoscopy today. Procedure Code(s):    --- Professional ---                       [phone], Esophagogastroduodenoscopy, flexible, transoral;                        with transendoscopic balloon dilation of esophagus                        (less than 30 mm diameter)                       43239, Esophagogastroduodenoscopy, flexible, transoral;                        with biopsy, single or multiple Diagnosis Code(s):    --- Professional ---                       K29.70, Gastritis, unspecified, without bleeding                       K22.2, Esophageal obstruction                       R13.10, Dysphagia, unspecified CPT copyright 2017 American Medical Association. All rights reserved. The codes documented  in this report are preliminary and upon coder review may  be revised to meet current compliance requirements. [name], MD [name] MD, MD 01/24/2018 12:01:49 PM This report has been signed electronically. Number of Addenda: 0 Note Initiated On: 01/24/2018 11:45 AM       [facility]

## 2018-01-25 LAB — SURGICAL PATHOLOGY

## 2018-01-26 ENCOUNTER — Encounter: Payer: Self-pay | Admitting: Gastroenterology

## 2018-02-06 ENCOUNTER — Encounter: Payer: Self-pay | Admitting: Gastroenterology

## 2018-02-08 DIAGNOSIS — J40 Bronchitis, not specified as acute or chronic: Secondary | ICD-10-CM | POA: Diagnosis not present

## 2018-02-08 DIAGNOSIS — J069 Acute upper respiratory infection, unspecified: Secondary | ICD-10-CM | POA: Diagnosis not present

## 2018-02-15 ENCOUNTER — Other Ambulatory Visit: Payer: Self-pay | Admitting: Family

## 2018-02-18 NOTE — Telephone Encounter (Signed)
Last filled 01/14/18 Never came back for BMP No office visit scheduled

## 2018-03-03 ENCOUNTER — Encounter

## 2018-03-03 ENCOUNTER — Encounter: Payer: Self-pay | Admitting: Family

## 2018-03-03 ENCOUNTER — Ambulatory Visit (INDEPENDENT_AMBULATORY_CARE_PROVIDER_SITE_OTHER): Payer: BLUE CROSS/BLUE SHIELD | Admitting: Family

## 2018-03-03 VITALS — BP 118/90 | HR 73 | Temp 98.2°F | Wt 203.2 lb

## 2018-03-03 DIAGNOSIS — R05 Cough: Secondary | ICD-10-CM | POA: Diagnosis not present

## 2018-03-03 DIAGNOSIS — J029 Acute pharyngitis, unspecified: Secondary | ICD-10-CM

## 2018-03-03 DIAGNOSIS — J329 Chronic sinusitis, unspecified: Secondary | ICD-10-CM | POA: Diagnosis not present

## 2018-03-03 DIAGNOSIS — R059 Cough, unspecified: Secondary | ICD-10-CM

## 2018-03-03 LAB — POCT RAPID STREP A (OFFICE): RAPID STREP A SCREEN: NEGATIVE

## 2018-03-03 MED ORDER — PROMETHAZINE-DM 6.25-15 MG/5ML PO SYRP: 5.0000 mL | ORAL_SOLUTION | Freq: Four times a day (QID) | ORAL | 0 refills | Status: DC | PRN

## 2018-03-03 MED ORDER — MONTELUKAST SODIUM 10 MG PO TABS: 10.0000 mg | ORAL_TABLET | Freq: Every day | ORAL | 3 refills | Status: DC

## 2018-03-03 MED ORDER — AMOXICILLIN-POT CLAVULANATE 875-125 MG PO TABS: 1.0000 | ORAL_TABLET | Freq: Two times a day (BID) | ORAL | 0 refills | Status: DC

## 2018-03-03 NOTE — Patient Instructions (Signed)
Please continue claritin and add singulair once daily for allergies/cough. Begin augmentin for sinus infection. You may use cough syrup as needed. Call if new/worsening symptoms or if symptoms are not improved in 1 week.

## 2018-03-03 NOTE — Progress Notes (Signed)
Subjective:    [name], male    DOB: 1965-12-17, 52 y.o.   MRN: [id number]  HPI  Jason Hooper is a 52 yr old male who presents today with chief complaint of cough.  Symptoms began 3-4 weeks ago. Went to the [place] walk-in clinic on 02/08/18 and was treated with doxycycline and rx cough (reviewed note in care everywhere).  Thought he was doing better but then went back to work and symptoms started back up. Mild sore throat, denies ear pain.  Notes that he has copious nasal drainage which "is starting to turn a brown color.     Review of Systems    See HPI  Past Medical History:  Diagnosis Date  . Alcohol abuse   . Allergy   . Bipolar disorder ([place])   . GERD (gastroesophageal reflux disease)   . Kidney stones 2014     Social History   Socioeconomic History  . Marital status: Single    Spouse name: Not on file  . Number of children: Not on file  . Years of education: Not on file  . Highest education level: Not on file  Occupational History  . Not on file  Social Needs  . Financial resource strain: Not on file  . Food insecurity:    Worry: Not on file    Inability: Not on file  . Transportation needs:    Medical: Not on file    Non-medical: Not on file  Tobacco Use  . Smoking status: Never Smoker  . Smokeless tobacco: Former Systems developer    Types: Snuff  Substance and Sexual Activity  . Alcohol use: Yes    Alcohol/week: 0.6 - 1.2 oz    Types: 1 - 2 Cans of beer per week    Comment: occasional  . Drug use: No  . Sexual activity: Yes  Lifestyle  . Physical activity:    Days per week: Not on file    Minutes per session: Not on file  . Stress: Not on file  Relationships  . Social connections:    Talks on phone: Not on file    Gets together: Not on file    Attends religious service: Not on file    Active member of club or organization: Not on file    Attends meetings of clubs or organizations: Not on file    Relationship status: Not on file  . Intimate  partner violence:    Fear of current or ex partner: Not on file    Emotionally abused: Not on file    Physically abused: Not on file    Forced sexual activity: Not on file  Other Topics Concern  . Not on file  Social History Narrative   Adopted.     Past Surgical History:  Procedure Laterality Date  . COLONOSCOPY WITH PROPOFOL N/A 01/24/2018   Procedure: COLONOSCOPY WITH PROPOFOL;  Surgeon: [name], MD;  Location: [facility] ENDOSCOPY;  Service: Gastroenterology;  Laterality: N/A;  . ESOPHAGEAL DILATION    . ESOPHAGOGASTRODUODENOSCOPY (EGD) WITH PROPOFOL N/A 01/24/2018   Procedure: ESOPHAGOGASTRODUODENOSCOPY (EGD) WITH PROPOFOL;  Surgeon: [name], MD;  Location: [facility] ENDOSCOPY;  Service: Gastroenterology;  Laterality: N/A;  . JOINT REPLACEMENT Right 07/16/2017   R great toe  . TONSILECTOMY/ADENOIDECTOMY WITH MYRINGOTOMY      Family History  Adopted: Yes  Family history unknown: Yes    No Known Allergies  Current Outpatient Medications on File Prior to Visit  Medication Sig Dispense Refill  .  amLODipine (NORVASC) 2.5 MG tablet Take 1 tablet (2.5 mg total) by mouth daily. 90 tablet 3  . cyclobenzaprine (FLEXERIL) 5 MG tablet Take 1 tablet (5 mg total) by mouth at bedtime as needed for muscle spasms. 30 tablet 0  . omeprazole (PRILOSEC OTC) 20 MG tablet Take 1 tablet by mouth as needed.    Marland Kitchen omeprazole (PRILOSEC) 40 MG capsule Take 1 capsule (40 mg total) by mouth daily. 90 capsule 3  . propranolol (INDERAL) 40 MG tablet TAKE 1 TABLET BY MOUTH TWICE A DAY 120 tablet 1  . sildenafil (VIAGRA) 50 MG tablet Take 1 tablet (50 mg total) by mouth daily as needed for erectile dysfunction. 1 hour prior to sexual activity. 10 tablet 3   No current facility-administered medications on file prior to visit.     BP 118/90 (BP Location: Left Arm, Patient Position: Sitting, Cuff Size: Normal)   Pulse 73   Temp 98.2 F (36.8 C) (Oral)   Wt 203 lb 4 oz (92.2 kg)   SpO2 97%   BMI 27.57  kg/m    Objective:   Physical Exam  Constitutional: He is oriented to person, place, and time. He appears well-developed and well-nourished. No distress.  HENT:  Head: Normocephalic and atraumatic.  Right Ear: Tympanic membrane and ear canal normal.  Left Ear: Tympanic membrane and ear canal normal.  Mouth/Throat: Posterior oropharyngeal erythema present. No oropharyngeal exudate or posterior oropharyngeal edema.  Neck: Neck supple. No edema present.  Cardiovascular: Normal rate and regular rhythm.  No murmur heard. Pulmonary/Chest: Effort normal and breath sounds normal. No respiratory distress. He has no wheezes. He has no rales.  Musculoskeletal: He exhibits no edema.  Neurological: He is alert and oriented to person, place, and time.  Skin: Skin is warm and dry.  Psychiatric: He has a normal mood and affect. His behavior is normal. Thought content normal.          Assessment & Plan:  Sinusitis- Rx with augmentin.  Rapid strep negative.   Cough- suspect that this related to his allergic rhinitis which is uncontrolled. Add trial of singulair, continue flonase. I recommended cxr given duration of symptoms but he declines. Requests rx for cough syrup. Rx given for promethazine DM. He is advised to call if symptoms worsen or if symptoms fail to improve.

## 2018-03-04 ENCOUNTER — Telehealth: Payer: Self-pay

## 2018-03-04 MED ORDER — BENZONATATE 100 MG PO CAPS: 100.0000 mg | ORAL_CAPSULE | Freq: Three times a day (TID) | ORAL | 0 refills | Status: DC | PRN

## 2018-03-04 NOTE — Telephone Encounter (Signed)
Copied from [place] [id number]. 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

## 2018-03-04 NOTE — Telephone Encounter (Signed)
rx sent for tessalon

## 2018-03-04 NOTE — Addendum Note (Signed)
Addended by: Debbrah Alar on: 03/04/2018 04:01 PM   Modules accepted: Orders

## 2018-03-08 NOTE — Telephone Encounter (Signed)
Another medication has been sent in for patient.

## 2018-04-18 ENCOUNTER — Encounter: Payer: Self-pay | Admitting: Family

## 2018-04-18 ENCOUNTER — Ambulatory Visit (INDEPENDENT_AMBULATORY_CARE_PROVIDER_SITE_OTHER): Payer: BLUE CROSS/BLUE SHIELD | Admitting: Family

## 2018-04-18 VITALS — BP 148/100 | HR 68 | Temp 98.4°F | Ht 72.0 in | Wt 206.0 lb

## 2018-04-18 DIAGNOSIS — Z0000 Encounter for general adult medical examination without abnormal findings: Secondary | ICD-10-CM

## 2018-04-18 DIAGNOSIS — Z113 Encounter for screening for infections with a predominantly sexual mode of transmission: Secondary | ICD-10-CM

## 2018-04-18 DIAGNOSIS — I10 Essential (primary) hypertension: Secondary | ICD-10-CM | POA: Diagnosis not present

## 2018-04-18 DIAGNOSIS — G8929 Other chronic pain: Secondary | ICD-10-CM

## 2018-04-18 DIAGNOSIS — M545 Low back pain, unspecified: Secondary | ICD-10-CM

## 2018-04-18 DIAGNOSIS — Z23 Encounter for immunization: Secondary | ICD-10-CM

## 2018-04-18 MED ORDER — PROPRANOLOL HCL 40 MG PO TABS: 40.0000 mg | ORAL_TABLET | Freq: Two times a day (BID) | ORAL | 2 refills | Status: DC

## 2018-04-18 MED ORDER — GABAPENTIN 100 MG PO CAPS: 100.0000 mg | ORAL_CAPSULE | Freq: Three times a day (TID) | ORAL | 0 refills | Status: DC

## 2018-04-18 MED ORDER — CYCLOBENZAPRINE HCL 5 MG PO TABS: 5.0000 mg | ORAL_TABLET | Freq: Every evening | ORAL | 2 refills | Status: DC | PRN

## 2018-04-18 NOTE — Assessment & Plan Note (Addendum)
Declines prostate exam.  He consents for his PSA to be drawn.  Patient declines repeat colonoscopy due to poor prep.  I advised him to call his insurance company and see if they would cover a repeat colonoscopy this year, or next.  He verbalized understanding of this.

## 2018-04-18 NOTE — Progress Notes (Signed)
Subjective:    [name], male    DOB: 12-31-1965, 52 y.o.   MRN: [id number]  CC: Jason Hooper is a 52 y.o. male who presents today for physical exam.    HPI: HTN- took blood pressure medication just prior visit. At home 130/88. Denies exertional chest pain or pressure, numbness or tingling radiating to left arm or jaw, palpitations, dizziness, frequent headaches, changes in vision, or shortness of breath.   Anxiety- Mood is stable. No si/hi.   Snores. Feels rested after sleep. No HA, fatigue.   Low back pain for several months, waxes and wanes. 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   takes 2 aleve every day. Dull ache. No injury. On feet at work, physical labor. No numbness, tingling, trouble urinating. Take tyelonol pm at night for low back pain and to help him sleep.   New sexual partner for past year. Noticed one lesion for past year, noticed 1-3 more. Non tender. No penile discharge, pain.   No h/o of genital herpes.  Declines testing for  hepatitis.        Colorectal  Cancer Screening: due; bowel prep suboptimal; EGD 01/2018 showed gastritis. Insurance company wont pay for another colonoscopy.  Trouble swallowing has resolved with prilosec. Told to stay on prilosec.   Prostate Cancer Screening: discussed, no difficulty urinating.  Lung Cancer Screening: No 30 year pack year history and > 55 years.  Immunizations       Tetanus - due         HIV Screening- Candidate for, consents Labs: Screening labs today. Exercise: Gets regular exercise.  Alcohol use: occasional Smoking/tobacco use: former snuff.  Regular dental exams: UTD Wears seat belt: Yes. Skin: no new skin lesions. No h/o skin cancer.    HISTORY:  Past Medical History:  Diagnosis Date  . Alcohol abuse   . Allergy   . Bipolar disorder ([place])   . GERD (gastroesophageal reflux disease)   . Kidney stones 2014    Past Surgical History:   Procedure Laterality Date  . COLONOSCOPY WITH PROPOFOL N/A 01/24/2018   Procedure: COLONOSCOPY WITH PROPOFOL;  Surgeon: [name], MD;  Location: [facility] ENDOSCOPY;  Service: Gastroenterology;  Laterality: N/A;  . ESOPHAGEAL DILATION    . ESOPHAGOGASTRODUODENOSCOPY (EGD) WITH PROPOFOL N/A 01/24/2018   Procedure: ESOPHAGOGASTRODUODENOSCOPY (EGD) WITH PROPOFOL;  Surgeon: [name], MD;  Location: [facility] ENDOSCOPY;  Service: Gastroenterology;  Laterality: N/A;  . JOINT REPLACEMENT Right 07/16/2017   R great toe  . TONSILECTOMY/ADENOIDECTOMY WITH MYRINGOTOMY     Family History  Adopted: Yes  Family history unknown: Yes      ALLERGIES: Patient has no known allergies.  Current Outpatient Medications on File Prior to Visit  Medication Sig Dispense Refill  . amLODipine (NORVASC) 2.5 MG tablet Take 1 tablet (2.5 mg total) by mouth daily. 90 tablet 3  . montelukast (SINGULAIR) 10 MG tablet Take 1 tablet (10 mg total) by mouth at bedtime. 30 tablet 3  . omeprazole (PRILOSEC) 40 MG capsule Take 1 capsule (40 mg total) by mouth daily. 90 capsule 3  . sildenafil (VIAGRA) 50 MG tablet Take 1 tablet (50 mg total) by mouth daily as needed for erectile dysfunction. 1 hour prior to sexual activity. 10 tablet 3   No current facility-administered medications on file prior to visit.     Social History   Tobacco Use  . Smoking status: Never Smoker  .  Smokeless tobacco: Former Systems developer    Types: Snuff  Substance Use Topics  . Alcohol use: Yes    Alcohol/week: 0.6 - 1.2 oz    Types: 1 - 2 Cans of beer per week    Comment: occasional  . Drug use: No    Review of Systems  Constitutional: Negative for chills, fatigue and fever.  HENT: Negative for congestion.   Respiratory: Negative for cough.   Cardiovascular: Negative for chest pain, palpitations and leg swelling.  Gastrointestinal: Negative for diarrhea, nausea and vomiting.  Genitourinary: Negative for difficulty urinating, discharge, penile pain,  penile swelling and testicular pain.  Musculoskeletal: Positive for back pain (chronic). Negative for myalgias.  Skin: Positive for rash.  Neurological: Negative for numbness and headaches.  Hematological: Negative for adenopathy.  Psychiatric/Behavioral: Negative for confusion and suicidal ideas. The patient is not nervous/anxious.       Objective:    BP (!) 148/100 (BP Location: Left Arm, Patient Position: Sitting, Cuff Size: Normal)   Pulse 68   Temp 98.4 F (36.9 C) (Oral)   Ht 6' (1.829 m)   Wt 206 lb (93.4 kg)   SpO2 99%   BMI 27.94 kg/m   BP Readings from Last 3 Encounters:  04/18/18 (!) 148/100  03/03/18 118/90  01/24/18 105/69   Wt Readings from Last 3 Encounters:  04/18/18 206 lb (93.4 kg)  03/03/18 203 lb 4 oz (92.2 kg)  01/24/18 200 lb (90.7 kg)    Physical Exam  Constitutional: He appears well-developed and well-nourished.  Cardiovascular: Regular rhythm and normal heart sounds.  Pulmonary/Chest: Effort normal and breath sounds normal. No respiratory distress. He has no wheezes. He has no rales.  Musculoskeletal:       Lumbar back: He exhibits normal range of motion, no tenderness, no swelling, no pain and no spasm.       Back:  Area of pain as described by patient marked on diagram.   Full range of motion with flexion, extension, lateral side bends. No pain, numbness, tingling elicited with single leg raise bilaterally. No rash.  Neurological: He is alert.  Skin: Skin is warm and dry.  Psychiatric: He has a normal mood and affect. His speech is normal and behavior is normal.  Vitals reviewed.      Assessment & Plan:   Problem List Items Addressed This Visit      Cardiovascular and Mediastinum   HTN (hypertension)    Elevated today.  Discussed NSAID use, is a likely culprit.  See note on low back pain. Also discussed screening for sleep apnea which patient is not done the past.  He does admit that he snores.  He politely declines today and will  continue to consider sleep study.  He will let me know. Close follow up.       Relevant Medications   propranolol (INDERAL) 40 MG tablet     Other   Chronic left-sided low back pain without sciatica    Chronic.  Concerned with his NSAID use, history of gastritis, uncontrolled blood pressure.  Will try gabapentin, very slowly.  Close follow-up in 1 month.  If no improvement, discussed with patient today consult with orthopedics, imaging, physical therapy.      Relevant Medications   cyclobenzaprine (FLEXERIL) 5 MG tablet   gabapentin (NEURONTIN) 100 MG capsule   Routine health maintenance - Primary    Declines prostate exam.  He consents for his PSA to be drawn.  Patient declines repeat colonoscopy due to poor  prep.  I advised him to call his insurance company and see if they would cover a repeat colonoscopy this year, or next.  He verbalized understanding of this.      Relevant Orders   Lipid panel   Comprehensive metabolic panel   PSA   Screen for STD (sexually transmitted disease)    Patient very pleasantly declines an examination of lesions described on his penis.  Pending STD testing today.  Referral to dermatology for biopsy, confirmation of diagnosis.  Working diagnosis of genital warts.       Relevant Orders   Ambulatory referral to Dermatology   Urine cytology ancillary only   RPR   HIV antibody   HSV 1/2 Ab (IgM), IFA w/rflx Titer    Other Visit Diagnoses    Need for diphtheria-tetanus-pertussis (Tdap) vaccine       Relevant Orders   Tdap vaccine greater than or equal to 7yo IM (Completed)       I have discontinued [name]. Jason "Michael"'s amoxicillin-clavulanate, promethazine-dextromethorphan, and benzonatate. I have also changed his propranolol. Additionally, I am having him start on gabapentin. Lastly, I am having him maintain his sildenafil, amLODipine, omeprazole, montelukast, and cyclobenzaprine.   Meds ordered this encounter  Medications  . propranolol  (INDERAL) 40 MG tablet    Sig: Take 1 tablet (40 mg total) by mouth 2 (two) times daily.    Dispense:  120 tablet    Refill:  2  . cyclobenzaprine (FLEXERIL) 5 MG tablet    Sig: Take 1 tablet (5 mg total) by mouth at bedtime as needed for muscle spasms.    Dispense:  30 tablet    Refill:  2  . gabapentin (NEURONTIN) 100 MG capsule    Sig: Take 1 capsule (100 mg total) by mouth 3 (three) times daily.    Dispense:  90 capsule    Refill:  0    Order Specific Question:   Supervising Provider    Answer:   Crecencio Mc [[id number]]    Return precautions given.   Risks, benefits, and alternatives of the medications and treatment plan prescribed today were discussed, and patient expressed understanding.   Education regarding symptom management and diagnosis given to patient on AVS.   Continue to follow with Burnard Hawthorne, FNP for routine health maintenance.   [name] and I agreed with plan.   [name], FNP

## 2018-04-18 NOTE — Assessment & Plan Note (Addendum)
Chronic.  Concerned with his NSAID use, history of gastritis, uncontrolled blood pressure.  Will try gabapentin, very slowly.  Close follow-up in 1 month.  If no improvement, discussed with patient today consult with orthopedics, imaging, physical therapy.

## 2018-04-18 NOTE — Patient Instructions (Addendum)
Please call [facility] and ask when they would allow for colonoscopy.   Fasting labs when you can; please make an appointment  Today we discussed referrals, orders. Dermatology   I have placed these orders in the system for you.  Please be sure to give [place] a call if you have not heard from our office regarding scheduling a test or regarding referral in a timely manner.  It is very important that you let me know as soon as possible.   Suspect blood pressure is elevated pto NSAID use.  Also can be elevated with undiagnosed sleep apnea.  Please continue consider having a sleep study done. I would like for you to trial medication called gabapentin to see if this would help with low back pain and allow you to take less Aleve.    start taking the gabapentin 100 mg capsule at bedtime.  Likely will increase this to 200 mg or perhaps even 300 mg total at bedtime.  It can be sedating so do not take with  Flexeril. I prescribed to be taken 100 mg 3 times a day which some patients do however I would like for you to go ahead and start with more of a bedtime dose.  Please let me know how you are doing with this. We can titrate from there. We should also consider physical therapy, imaging of low back, and orthopedic consult.   Monitor blood pressure,  Goal is less than 130/80; if persistently higher, please make sooner follow up appointment so we can recheck you blood pressure and manage medications     Health Maintenance, Male A healthy lifestyle and preventive care is important for your health and wellness. Ask your health care provider about what schedule of regular examinations is right for you. What should I know about weight and diet? Eat a Healthy Diet  Eat plenty of vegetables, fruits, whole grains, low-fat dairy products, and lean protein.  Do not eat a lot of foods high in solid fats, added sugars, or salt.  Maintain a Healthy Weight Regular exercise can help you achieve or maintain a healthy weight.  You should:  Do at least 150 minutes of exercise each week. The exercise should increase your heart rate and make you sweat (moderate-intensity exercise).  Do strength-training exercises at least twice a week.  Watch Your Levels of Cholesterol and Blood Lipids  Have your blood tested for lipids and cholesterol every 5 years starting at 53 years of age. If you are at high risk for heart disease, you should start having your blood tested when you are 53 years old. You may need to have your cholesterol levels checked more often if: ? Your lipid or cholesterol levels are high. ? You are older than 53 years of age. ? You are at high risk for heart disease.  What should I know about cancer screening? Many types of cancers can be detected early and may often be prevented. Lung Cancer  You should be screened every year for lung cancer if: ? You are a current smoker who has smoked for at least 30 years. ? You are a former smoker who has quit within the past 15 years.  Talk to your health care provider about your screening options, when you should start screening, and how often you should be screened.  Colorectal Cancer  Routine colorectal cancer screening usually begins at 53 years of age and should be repeated every 5-10 years until you are 53 years old. You may need  to be screened more often if early forms of precancerous polyps or small growths are found. Your health care provider may recommend screening at an earlier age if you have risk factors for colon cancer.  Your health care provider may recommend using home test kits to check for hidden blood in the stool.  A small camera at the end of a tube can be used to examine your colon (sigmoidoscopy or colonoscopy). This checks for the earliest forms of colorectal cancer.  Prostate and Testicular Cancer  Depending on your age and overall health, your health care provider may do certain tests to screen for prostate and testicular  cancer.  Talk to your health care provider about any symptoms or concerns you have about testicular or prostate cancer.  Skin Cancer  Check your skin from head to toe regularly.  Tell your health care provider about any new moles or changes in moles, especially if: ? There is a change in a mole's size, shape, or color. ? You have a mole that is larger than a pencil eraser.  Always use sunscreen. Apply sunscreen liberally and repeat throughout the day.  Protect yourself by wearing long sleeves, pants, a wide-brimmed hat, and sunglasses when outside.  What should I know about heart disease, diabetes, and high blood pressure?  If you are 71-59 years of age, have your blood pressure checked every 3-5 years. If you are 39 years of age or older, have your blood pressure checked every year. You should have your blood pressure measured twice-once when you are at a hospital or clinic, and once when you are not at a hospital or clinic. Record the average of the two measurements. To check your blood pressure when you are not at a hospital or clinic, you can use: ? An automated blood pressure machine at a pharmacy. ? A home blood pressure monitor.  Talk to your health care provider about your target blood pressure.  If you are between 56-45 years old, ask your health care provider if you should take aspirin to prevent heart disease.  Have regular diabetes screenings by checking your fasting blood sugar level. ? If you are at a normal weight and have a low risk for diabetes, have this test once every three years after the age of 52. ? If you are overweight and have a high risk for diabetes, consider being tested at a younger age or more often.  A one-time screening for abdominal aortic aneurysm (AAA) by ultrasound is recommended for men aged 58-75 years who are current or former smokers. What should I know about preventing infection? Hepatitis B If you have a higher risk for hepatitis B, you  should be screened for this virus. Talk with your health care provider to find out if you are at risk for hepatitis B infection. Hepatitis C Blood testing is recommended for:  Everyone born from [birth year] through 1966.  Anyone with known risk factors for hepatitis C.  Sexually Transmitted Diseases (STDs)  You should be screened each year for STDs including gonorrhea and chlamydia if: ? You are sexually active and are younger than 53 years of age. ? You are older than 53 years of age and your health care provider tells you that you are at risk for this type of infection. ? Your sexual activity has changed since you were last screened and you are at an increased risk for chlamydia or gonorrhea. Ask your health care provider if you are at risk.  Talk with  your health care provider about whether you are at high risk of being infected with HIV. Your health care provider may recommend a prescription medicine to help prevent HIV infection.  What else can I do?  Schedule regular health, dental, and eye exams.  Stay current with your vaccines (immunizations).  Do not use any tobacco products, such as cigarettes, chewing tobacco, and e-cigarettes. If you need help quitting, ask your health care provider.  Limit alcohol intake to no more than 2 drinks per day. One drink equals 12 ounces of beer, 5 ounces of wine, or 1 ounces of hard liquor.  Do not use street drugs.  Do not share needles.  Ask your health care provider for help if you need support or information about quitting drugs.  Tell your health care provider if you often feel depressed.  Tell your health care provider if you have ever been abused or do not feel safe at home. This information is not intended to replace advice given to you by your health care provider. Make sure you discuss any questions you have with your health care provider. Document Released: 03/26/2008 Document Revised: 05/27/2016 Document Reviewed:  07/02/2015 Elsevier Interactive Patient Education  [employer].

## 2018-04-18 NOTE — Assessment & Plan Note (Signed)
Elevated today.  Discussed NSAID use, is a likely culprit.  See note on low back pain. Also discussed screening for sleep apnea which patient is not done the past.  He does admit that he snores.  He politely declines today and will continue to consider sleep study.  He will let me know. Close follow up.

## 2018-04-18 NOTE — Assessment & Plan Note (Signed)
Patient very pleasantly declines an examination of lesions described on his penis.  Pending STD testing today.  Referral to dermatology for biopsy, confirmation of diagnosis.  Working diagnosis of genital warts.

## 2018-04-26 ENCOUNTER — Telehealth: Payer: Self-pay

## 2018-04-26 ENCOUNTER — Telehealth: Payer: Self-pay | Admitting: *Deleted

## 2018-04-26 NOTE — Telephone Encounter (Signed)
Joycelyn Schmid wanted to follow up with patient to see how blood pressure is running. Left voice mail for patient to call back ok for [facility] to speak to patient

## 2018-04-26 NOTE — Telephone Encounter (Signed)
Copied from [place] [id number]. Topic: Referral - Status >> Apr 26, 2018 11:47 AM Neva Seat wrote: Pt needing dermatologist referral appt.  Pt hasn't heard from anyone to make the appt.

## 2018-04-26 NOTE — Progress Notes (Signed)
Left message to return call 

## 2018-04-29 NOTE — Telephone Encounter (Signed)
[facility]

## 2018-05-11 ENCOUNTER — Encounter: Payer: Self-pay | Admitting: Family

## 2018-05-11 ENCOUNTER — Ambulatory Visit (INDEPENDENT_AMBULATORY_CARE_PROVIDER_SITE_OTHER): Payer: BLUE CROSS/BLUE SHIELD | Admitting: Family

## 2018-05-11 VITALS — BP 140/95 | HR 72 | Temp 98.5°F | Resp 15 | Wt 201.5 lb

## 2018-05-11 DIAGNOSIS — I10 Essential (primary) hypertension: Secondary | ICD-10-CM

## 2018-05-11 DIAGNOSIS — Z113 Encounter for screening for infections with a predominantly sexual mode of transmission: Secondary | ICD-10-CM | POA: Diagnosis not present

## 2018-05-11 DIAGNOSIS — G8929 Other chronic pain: Secondary | ICD-10-CM | POA: Diagnosis not present

## 2018-05-11 DIAGNOSIS — Z0000 Encounter for general adult medical examination without abnormal findings: Secondary | ICD-10-CM

## 2018-05-11 DIAGNOSIS — M545 Low back pain, unspecified: Secondary | ICD-10-CM

## 2018-05-11 MED ORDER — TRAMADOL HCL 50 MG PO TABS: 50.0000 mg | ORAL_TABLET | Freq: Every day | ORAL | 1 refills | Status: DC | PRN

## 2018-05-11 MED ORDER — AMLODIPINE BESYLATE 5 MG PO TABS: 5.0000 mg | ORAL_TABLET | Freq: Every day | ORAL | 1 refills | Status: DC

## 2018-05-11 NOTE — Patient Instructions (Signed)
Stop gabapentin  Start tramadol as needed for low back pain  Increase amlodipine to 5mg   Monitor blood pressure,  Goal is less than 130/80; if persistently higher, please make sooner follow up appointment so we can recheck you blood pressure and manage medications

## 2018-05-11 NOTE — Assessment & Plan Note (Signed)
Chronic, unchanged.  Discontinue gabapentin.  Poor candidate for NSAIDs especially since blood pressure is not well controlled.  Given tramadol 30 tablets to use daily PRN. Do not anticipate this to be daily. Controlled substance contract in place.  Long discussion with patient in regards to this medication working on opioid receptors.  Patient is remote history 15 years ago seizure while drinking.  He no longer drinks at this degree.  He is aware that he should not drink on tramadol.  Advised patient will need to be seen every 3 to 4 months to maintain on this medication.  He is in agreement with plan. I looked up patient on [place] Controlled Substances Reporting System and saw no activity that raised concern of inappropriate use.

## 2018-05-11 NOTE — Progress Notes (Signed)
Subjective:    [name], male    DOB: [birth year], 52 y.o.   MRN: [id number]  CC: Jason Hooper is a 52 y.o. male who presents today for follow up.   HPI: Low back pain- chronic. Unchanged. gabapentin not helping.  Has used tramadol in the past with relief. Flexeril with some relief. Would like medication to take PRN when pain is severe.   HTN- Improved however last week didn't sleep well due to low back pain. 130/90 typical at home. Denies exertional chest pain or pressure, numbness or tingling radiating to left arm or jaw, palpitations, dizziness, frequent headaches, changes in vision, or shortness of breath.    Remote h/o suspected seizure 15 years ago after drinking. No recurrent episodes. Very occasional alcohol use with  1-2 cans of beer.       HISTORY:  Past Medical History:  Diagnosis Date  . Alcohol abuse   . Allergy   . Bipolar disorder ([place])   . GERD (gastroesophageal reflux disease)   . Kidney stones 2014   Past Surgical History:  Procedure Laterality Date  . COLONOSCOPY WITH PROPOFOL N/A 01/24/2018   Procedure: COLONOSCOPY WITH PROPOFOL;  Surgeon: [name], MD;  Location: [facility] ENDOSCOPY;  Service: Gastroenterology;  Laterality: N/A;  . ESOPHAGEAL DILATION    . ESOPHAGOGASTRODUODENOSCOPY (EGD) WITH PROPOFOL N/A 01/24/2018   Procedure: ESOPHAGOGASTRODUODENOSCOPY (EGD) WITH PROPOFOL;  Surgeon: [name], MD;  Location: [facility] ENDOSCOPY;  Service: Gastroenterology;  Laterality: N/A;  . JOINT REPLACEMENT Right 07/16/2017   R great toe  . TONSILECTOMY/ADENOIDECTOMY WITH MYRINGOTOMY     Family History  Adopted: Yes  Family history unknown: Yes    Allergies: Patient has no known allergies. Current Outpatient Medications on File Prior to Visit  Medication Sig Dispense Refill  . cyclobenzaprine (FLEXERIL) 5 MG tablet Take 1 tablet (5 mg total) by mouth at bedtime as needed for muscle spasms. 30 tablet 2  . montelukast (SINGULAIR) 10 MG tablet Take 1  tablet (10 mg total) by mouth at bedtime. 30 tablet 3  . omeprazole (PRILOSEC) 40 MG capsule Take 1 capsule (40 mg total) by mouth daily. 90 capsule 3  . propranolol (INDERAL) 40 MG tablet Take 1 tablet (40 mg total) by mouth 2 (two) times daily. 120 tablet 2  . sildenafil (VIAGRA) 50 MG tablet Take 1 tablet (50 mg total) by mouth daily as needed for erectile dysfunction. 1 hour prior to sexual activity. 10 tablet 3   No current facility-administered medications on file prior to visit.     Social History   Tobacco Use  . Smoking status: Never Smoker  . Smokeless tobacco: Former Systems developer    Types: Snuff  Substance Use Topics  . Alcohol use: Yes    Alcohol/week: 0.6 - 1.2 oz    Types: 1 - 2 Cans of beer per week    Comment: occasional  . Drug use: No    Review of Systems    Objective:    BP (!) 140/95   Pulse 72   Temp 98.5 F (36.9 C) (Oral)   Resp 15   Wt 201 lb 8 oz (91.4 kg)   SpO2 97%   BMI 27.33 kg/m  BP Readings from Last 3 Encounters:  05/11/18 (!) 140/95  04/18/18 (!) 148/100  03/03/18 118/90   Wt Readings from Last 3 Encounters:  05/11/18 201 lb 8 oz (91.4 kg)  04/18/18 206 lb (93.4 kg)  03/03/18 203 lb 4 oz (92.2 kg)  Physical Exam     Assessment & Plan:   Problem List Items Addressed This Visit      Cardiovascular and Mediastinum   HTN (hypertension)    Elevated today.  We will go ahead and increase amlodipine to 5 mg.  Patient monitor at home.      Relevant Medications   amLODipine (NORVASC) 5 MG tablet     Other   Chronic left-sided low back pain without sciatica - Primary    Chronic, unchanged.  Discontinue gabapentin.  Poor candidate for NSAIDs especially since blood pressure is not well controlled.  Given tramadol 30 tablets to use daily PRN. Do not anticipate this to be daily. Controlled substance contract in place.  Long discussion with patient in regards to this medication working on opioid receptors.  Patient is remote history 15 years  ago seizure while drinking.  He no longer drinks at this degree.  He is aware that he should not drink on tramadol.  Advised patient will need to be seen every 3 to 4 months to maintain on this medication.  He is in agreement with plan. I looked up patient on [place] Controlled Substances Reporting System and saw no activity that raised concern of inappropriate use.        Relevant Medications   traMADol (ULTRAM) 50 MG tablet   Routine health maintenance   Screen for STD (sexually transmitted disease)       I have discontinued [name]. Mcweeney "Michael"'s gabapentin. I have also changed his amLODipine. Additionally, I am having him start on traMADol. Lastly, I am having him maintain his sildenafil, omeprazole, montelukast, propranolol, and cyclobenzaprine.   Meds ordered this encounter  Medications  . amLODipine (NORVASC) 5 MG tablet    Sig: Take 1 tablet (5 mg total) by mouth daily.    Dispense:  90 tablet    Refill:  1    Order Specific Question:   Supervising Provider    Answer:   Deborra Medina L [[id number]]  . traMADol (ULTRAM) 50 MG tablet    Sig: Take 1 tablet (50 mg total) by mouth daily as needed.    Dispense:  30 tablet    Refill:  1    Order Specific Question:   Supervising Provider    Answer:   Crecencio Mc [[id number]]    Return precautions given.   Risks, benefits, and alternatives of the medications and treatment plan prescribed today were discussed, and patient expressed understanding.   Education regarding symptom management and diagnosis given to patient on AVS.  Continue to follow with Burnard Hawthorne, FNP for routine health maintenance.   [name] and I agreed with plan.   [name], FNP

## 2018-05-11 NOTE — Assessment & Plan Note (Signed)
Elevated today.  We will go ahead and increase amlodipine to 5 mg.  Patient monitor at home.

## 2018-05-11 NOTE — Addendum Note (Signed)
Addended by: Arby Barrette on: 05/11/2018 02:17 PM   Modules accepted: Orders

## 2018-05-12 ENCOUNTER — Telehealth: Payer: Self-pay

## 2018-05-12 NOTE — Telephone Encounter (Signed)
Prior authorization completed and approved

## 2018-05-12 NOTE — Telephone Encounter (Signed)
Copied from [place] [id number]. Topic: Inquiry >> May 12, 2018  1:07 PM Pricilla Handler wrote: Reason for CRM: Patient called stating that his pharmacy will not release his prescription of TraMADol (ULTRAM) 50 MG tablet until our office contact patient's insurance company to verify the prescription. Please call the patient at [phone].     Thank You!!!

## 2018-05-15 ENCOUNTER — Encounter: Payer: Self-pay | Admitting: Family

## 2018-05-17 LAB — COMPREHENSIVE METABOLIC PANEL
AG Ratio: 2 (calc) (ref 1.0–2.5)
ALBUMIN MSPROF: 4.7 g/dL (ref 3.6–5.1)
ALT: 37 U/L (ref 9–46)
AST: 29 U/L (ref 10–35)
Alkaline phosphatase (APISO): 86 U/L (ref 40–115)
BUN: 20 mg/dL (ref 7–25)
CHLORIDE: 99 mmol/L (ref 98–110)
CO2: 31 mmol/L (ref 20–32)
CREATININE: 1.04 mg/dL (ref 0.70–1.33)
Calcium: 9.8 mg/dL (ref 8.6–10.3)
GLOBULIN: 2.4 g/dL (ref 1.9–3.7)
GLUCOSE: 125 mg/dL — AB (ref 65–99)
POTASSIUM: 4.1 mmol/L (ref 3.5–5.3)
Sodium: 137 mmol/L (ref 135–146)
Total Bilirubin: 1.3 mg/dL — ABNORMAL HIGH (ref 0.2–1.2)
Total Protein: 7.1 g/dL (ref 6.1–8.1)

## 2018-05-17 LAB — LIPID PANEL
CHOL/HDL RATIO: 4.3 (calc) (ref <5.0)
Cholesterol: 164 mg/dL (ref <200)
HDL: 38 mg/dL — ABNORMAL LOW (ref >40)
LDL Cholesterol (Calc): 96 mg/dL (calc)
NON-HDL CHOLESTEROL (CALC): 126 mg/dL (ref <130)
Triglycerides: 204 mg/dL — ABNORMAL HIGH (ref <150)

## 2018-05-17 LAB — HSV 1/2 AB (IGM), IFA W/RFLX TITER
HSV 1 IgM Screen: POSITIVE — AB
HSV 2 IgM Screen: POSITIVE — AB

## 2018-05-17 LAB — PSA: PSA: 0.5 ng/mL (ref <=4.0)

## 2018-05-17 LAB — RPR: RPR: NONREACTIVE

## 2018-05-17 LAB — HIV ANTIBODY (ROUTINE TESTING W REFLEX): HIV 1&2 Ab, 4th Generation: NONREACTIVE

## 2018-05-17 LAB — HSV 1 IGM, IFA (REFLEX): HSV 1 IGM TITER: 1:40 {titer} — AB

## 2018-05-17 LAB — HSV 2 IGM TITER: HSV 2 IGM TITER: 1:40 {titer} — AB

## 2018-05-17 NOTE — Progress Notes (Signed)
Patient sent mychart message.

## 2018-05-19 ENCOUNTER — Encounter: Payer: Self-pay | Admitting: Family

## 2018-05-23 ENCOUNTER — Other Ambulatory Visit: Payer: Self-pay | Admitting: Family

## 2018-05-23 DIAGNOSIS — D225 Melanocytic nevi of trunk: Secondary | ICD-10-CM | POA: Diagnosis not present

## 2018-05-23 DIAGNOSIS — Z1283 Encounter for screening for malignant neoplasm of skin: Secondary | ICD-10-CM | POA: Diagnosis not present

## 2018-05-23 DIAGNOSIS — B078 Other viral warts: Secondary | ICD-10-CM | POA: Diagnosis not present

## 2018-05-23 DIAGNOSIS — A630 Anogenital (venereal) warts: Secondary | ICD-10-CM

## 2018-05-23 DIAGNOSIS — D2260 Melanocytic nevi of unspecified upper limb, including shoulder: Secondary | ICD-10-CM | POA: Diagnosis not present

## 2018-05-23 DIAGNOSIS — D2270 Melanocytic nevi of unspecified lower limb, including hip: Secondary | ICD-10-CM | POA: Diagnosis not present

## 2018-05-23 MED ORDER — VALACYCLOVIR HCL 500 MG PO TABS: 500.0000 mg | ORAL_TABLET | Freq: Two times a day (BID) | ORAL | 1 refills | Status: DC

## 2018-06-22 ENCOUNTER — Telehealth: Payer: Self-pay | Admitting: Family

## 2018-06-22 NOTE — Telephone Encounter (Signed)
Copied from [place] [id number]. Topic: Quick Communication - Rx Refill/Question >> Jun 22, 2018  1:07 PM Margot Ables wrote: Medication: sildenafil (VIAGRA) 50 MG tablet - pt has 1 pill left - RX has expired and pt wanting to change pharmacy for this medication - [facility] advised pt they only have 100mg  tablets - can order for 100mg  or to take 1/2 50mg .   Has the patient contacted their pharmacy? Yes - RX expired - need new RX Preferred Pharmacy (with phone number or street name): [facility], [place]. [phone] (Phone) [phone] (Fax)

## 2018-06-22 NOTE — Telephone Encounter (Signed)
sildenafil refill Last Refill: written on 04/26/17 #10  Last OV: last addressed by Thersa Salt 04/26/17 PCP: Ledell Noss Pharmacy: [facility] [place], [place]  Also see CRM # [phone] and below note

## 2018-06-22 NOTE — Telephone Encounter (Signed)
Patient has appt scheduled in October

## 2018-06-23 ENCOUNTER — Other Ambulatory Visit: Payer: Self-pay | Admitting: Family

## 2018-06-24 ENCOUNTER — Other Ambulatory Visit: Payer: Self-pay | Admitting: Family

## 2018-06-24 MED ORDER — SILDENAFIL CITRATE 50 MG PO TABS: 50.0000 mg | ORAL_TABLET | Freq: Every day | ORAL | 0 refills | Status: DC | PRN

## 2018-06-24 NOTE — Telephone Encounter (Signed)
Call pt  How are BPs?  We discussed starting hctz if not consistently < 130/80.let me know  Will give one month supply of viagra and please advise him to discuss further refills at follow up

## 2018-06-24 NOTE — Addendum Note (Signed)
Addended by: Burnard Hawthorne on: 06/24/2018 10:06 AM   Modules accepted: Orders

## 2018-06-25 ENCOUNTER — Other Ambulatory Visit: Payer: Self-pay | Admitting: Family

## 2018-06-27 ENCOUNTER — Encounter: Payer: Self-pay | Admitting: Family

## 2018-06-27 NOTE — Telephone Encounter (Signed)
Called patient back blood pressure running around 130/80s no greater than . He hasn't taken blood pressure in a week. See below message.

## 2018-06-27 NOTE — Telephone Encounter (Signed)
noted 

## 2018-06-27 NOTE — Telephone Encounter (Signed)
Left voicemail to call see, below message

## 2018-06-27 NOTE — Telephone Encounter (Signed)
Patient calling and states that his blood pressures have been running about the same. States that he has not been able to check them in the last couple days.

## 2018-06-27 NOTE — Telephone Encounter (Signed)
FYI

## 2018-07-06 ENCOUNTER — Encounter: Payer: Self-pay | Admitting: Family

## 2018-07-06 DIAGNOSIS — M545 Low back pain: Principal | ICD-10-CM

## 2018-07-06 DIAGNOSIS — G8929 Other chronic pain: Secondary | ICD-10-CM

## 2018-07-06 MED ORDER — TRAMADOL HCL 50 MG PO TABS: 50.0000 mg | ORAL_TABLET | Freq: Two times a day (BID) | ORAL | 1 refills | Status: DC | PRN

## 2018-07-06 NOTE — Telephone Encounter (Signed)
See telephone note  Pending appt 10/11  I looked up patient on  Controlled Substances Reporting System and saw no activity that raised concern of inappropriate use.

## 2018-07-06 NOTE — Telephone Encounter (Signed)
See additional mychart message

## 2018-07-06 NOTE — Telephone Encounter (Signed)
BP 180/85 correction , on amlodipine 5 mg daily and propranolol 40mg  twice a day , no headache, no chest pain , no numbness, tingling. Taking aleve. Taking ultram 1daily as needed for pain, wants to increase .

## 2018-07-08 NOTE — Telephone Encounter (Addendum)
See other my chart message for documentation 

## 2018-07-22 ENCOUNTER — Ambulatory Visit (INDEPENDENT_AMBULATORY_CARE_PROVIDER_SITE_OTHER): Payer: BLUE CROSS/BLUE SHIELD | Admitting: Family

## 2018-07-22 VITALS — BP 130/80 | HR 64 | Temp 98.7°F | Resp 16 | Wt 206.4 lb

## 2018-07-22 DIAGNOSIS — G8929 Other chronic pain: Secondary | ICD-10-CM

## 2018-07-22 DIAGNOSIS — M545 Low back pain, unspecified: Secondary | ICD-10-CM

## 2018-07-22 DIAGNOSIS — I10 Essential (primary) hypertension: Secondary | ICD-10-CM

## 2018-07-22 MED ORDER — AMLODIPINE BESYLATE 10 MG PO TABS: 10.0000 mg | ORAL_TABLET | Freq: Every day | ORAL | 3 refills | Status: DC

## 2018-07-22 MED ORDER — CYCLOBENZAPRINE HCL 10 MG PO TABS: 10.0000 mg | ORAL_TABLET | Freq: Every evening | ORAL | 2 refills | Status: DC | PRN

## 2018-07-22 NOTE — Patient Instructions (Addendum)
Please stay very safe on tramadol and flexeril. Do not drink alcohol AND do not drive on these medications.   As discussed, both tramadol and alcohol LOWER seizure threshold.   Increase norvasc from 5mg   Daily  to 10 mg once per day. Goal of BP < 120/80.   Essentials for good sleep:   #1 Exercise #2 Limit Caffeine ( no caffeine after lunch) #3 No smart phones, TV prior to bed -- BLUE light is VERY activating and send the brain an 'awake message.'  #4 Go to bed at same time of night each night and get up at same time of day.  #5 Take 0.5 to 5mg  melatonin at 7pm with dinner -this is when natural melatonin will start to increase #6 May try over the counter Unisom for sleep aid

## 2018-07-22 NOTE — Progress Notes (Signed)
Subjective:    [name], male    DOB: January 12, 1966, 52 y.o.   MRN: [id number]  CC: [name] is a 52 y.o. male who presents today for follow up.   HPI: Low back pain- left sided sciatica. on tramadol BID every day, pain is little improved, taking less NSAIDs.  would like to increase his flexeril  Chronic low back spasms- 10 years. Has always been on 10mg  flexeril, takes qhs.  H/o seizure ( one)  Over 20 years ago from alcohol which we have discussed in the past.   'Maybe will drink 3 beers total per week. ' No depression. No si/hi.   HTN- compliant with medications. At home 130/85. Denies exertional chest pain or pressure, numbness or tingling radiating to left arm or jaw, palpitations, dizziness, frequent headaches, changes in vision, or shortness of breath.   Chronic insomnia- trouble falling asleep; he gets home at midnight and eats, then 'just cannot wind down from long shift'.  Works at [employer].  Drinks 12 ounce soda once per day. Snores. Not sleeping as well since stopped using advil PM (stopped NSAIDs).      HISTORY:  Past Medical History:  Diagnosis Date  . Alcohol abuse   . Allergy   . Bipolar disorder ([place])   . GERD (gastroesophageal reflux disease)   . Kidney stones 2014   Past Surgical History:  Procedure Laterality Date  . COLONOSCOPY WITH PROPOFOL N/A 01/24/2018   Procedure: COLONOSCOPY WITH PROPOFOL;  Surgeon: [name], MD;  Location: [facility] ENDOSCOPY;  Service: Gastroenterology;  Laterality: N/A;  . ESOPHAGEAL DILATION    . ESOPHAGOGASTRODUODENOSCOPY (EGD) WITH PROPOFOL N/A 01/24/2018   Procedure: ESOPHAGOGASTRODUODENOSCOPY (EGD) WITH PROPOFOL;  Surgeon: [name], MD;  Location: [facility] ENDOSCOPY;  Service: Gastroenterology;  Laterality: N/A;  . JOINT REPLACEMENT Right 07/16/2017   R great toe  . TONSILECTOMY/ADENOIDECTOMY WITH MYRINGOTOMY     Family History  Adopted: Yes  Family history unknown: Yes    Allergies: Patient has no known  allergies. Current Outpatient Medications on File Prior to Visit  Medication Sig Dispense Refill  . montelukast (SINGULAIR) 10 MG tablet TAKE 1 TABLET BY MOUTH EVERYDAY AT BEDTIME 90 tablet 2  . omeprazole (PRILOSEC) 40 MG capsule Take 1 capsule (40 mg total) by mouth daily. 90 capsule 3  . propranolol (INDERAL) 40 MG tablet Take 1 tablet (40 mg total) by mouth 2 (two) times daily. 120 tablet 2  . sildenafil (VIAGRA) 50 MG tablet Take 1 tablet (50 mg total) by mouth daily as needed for erectile dysfunction. 1 hour prior to sexual activity. 10 tablet 0  . traMADol (ULTRAM) 50 MG tablet Take 1 tablet (50 mg total) by mouth 2 (two) times daily as needed. 60 tablet 1  . valACYclovir (VALTREX) 500 MG tablet Take 1 tablet (500 mg total) by mouth 2 (two) times daily. 30 tablet 1   No current facility-administered medications on file prior to visit.     Social History   Tobacco Use  . Smoking status: Never Smoker  . Smokeless tobacco: Former Systems developer    Types: Snuff  Substance Use Topics  . Alcohol use: Yes    Alcohol/week: 1.0 - 2.0 standard drinks    Types: 1 - 2 Cans of beer per week    Comment: occasional  . Drug use: No    Review of Systems  Constitutional: Negative for chills and fever.  Respiratory: Negative for cough.   Cardiovascular: Negative for chest pain  and palpitations.  Gastrointestinal: Negative for nausea and vomiting.  Musculoskeletal: Positive for back pain (chronic).  Neurological: Positive for numbness (left sided).      Objective:    BP 130/80   Pulse 64   Temp 98.7 F (37.1 C) (Oral)   Resp 16   Wt 206 lb 6 oz (93.6 kg)   SpO2 96%   BMI 27.99 kg/m  BP Readings from Last 3 Encounters:  07/22/18 130/80  05/11/18 (!) 140/95  04/18/18 (!) 148/100   Wt Readings from Last 3 Encounters:  07/22/18 206 lb 6 oz (93.6 kg)  05/11/18 201 lb 8 oz (91.4 kg)  04/18/18 206 lb (93.4 kg)    Physical Exam  Constitutional: He appears well-developed and  well-nourished.  Cardiovascular: Regular rhythm and normal heart sounds.  Pulmonary/Chest: Effort normal and breath sounds normal. No respiratory distress. He has no wheezes. He has no rhonchi. He has no rales.  Neurological: He is alert.  Skin: Skin is warm and dry.  Psychiatric: He has a normal mood and affect. His speech is normal and behavior is normal.  Vitals reviewed.      Assessment & Plan:   Problem List Items Addressed This Visit      Cardiovascular and Mediastinum   HTN (hypertension) - Primary    Slightly elevated. Will increase amlodipine. Patient will monitor BP from home and let me know if not at goal      Relevant Medications   amLODipine (NORVASC) 10 MG tablet     Other   Chronic left-sided low back pain without sciatica    Chronic. Unchanged. Stable on current regimen tramadol BID and flexeril ( increased to 10mg ) qhs. Declines orthopedic consult, imaging, or consult with pain management. He will let me know if back pain worsens and the need for further evaluation.  Discussed risk of this tramadol, flexeril.  Advised that I will not increase from here. Patient verbally understands . I discussed with patient today side effects  including excessive sedation leading to respiratory depression,  impaired thinking/driving, and addiction and seizure Patient was advised to avoid concurrent use with alcohol, to use medication only as needed and not to share with others. Controlled substance contract in place.        Relevant Medications   cyclobenzaprine (FLEXERIL) 10 MG tablet       I have discontinued [name]. [place]ts "Michael"'s amLODipine. I have also changed his cyclobenzaprine. Additionally, I am having him start on amLODipine. Lastly, I am having him maintain his omeprazole, propranolol, valACYclovir, montelukast, sildenafil, and traMADol.   Meds ordered this encounter  Medications  . amLODipine (NORVASC) 10 MG tablet    Sig: Take 1 tablet (10 mg total) by mouth  daily.    Dispense:  90 tablet    Refill:  3    Order Specific Question:   Supervising Provider    Answer:   Deborra Medina L [[id number]]  . cyclobenzaprine (FLEXERIL) 10 MG tablet    Sig: Take 1 tablet (10 mg total) by mouth at bedtime as needed for muscle spasms.    Dispense:  30 tablet    Refill:  2    Order Specific Question:   Supervising Provider    Answer:   Crecencio Mc [[id number]]    Return precautions given.   Risks, benefits, and alternatives of the medications and treatment plan prescribed today were discussed, and patient expressed understanding.   Education regarding symptom management and diagnosis given to patient on AVS.  Continue to follow with Burnard Hawthorne, FNP for routine health maintenance.   [name] and I agreed with plan.   [name], FNP

## 2018-07-22 NOTE — Assessment & Plan Note (Signed)
Slightly elevated. Will increase amlodipine. Patient will monitor BP from home and let me know if not at goal

## 2018-07-22 NOTE — Assessment & Plan Note (Addendum)
Chronic. Unchanged. Stable on current regimen tramadol BID and flexeril ( increased to 10mg ) qhs. Declines orthopedic consult, imaging, or consult with pain management. He will let me know if back pain worsens and the need for further evaluation.  Discussed risk of this tramadol, flexeril.  Advised that I will not increase from here. Patient verbally understands . I discussed with patient today side effects  including excessive sedation leading to respiratory depression,  impaired thinking/driving, and addiction and seizure Patient was advised to avoid concurrent use with alcohol, to use medication only as needed and not to share with others. Controlled substance contract in place.

## 2018-07-25 DIAGNOSIS — R234 Changes in skin texture: Secondary | ICD-10-CM | POA: Diagnosis not present

## 2018-07-25 DIAGNOSIS — R208 Other disturbances of skin sensation: Secondary | ICD-10-CM | POA: Diagnosis not present

## 2018-07-25 DIAGNOSIS — B078 Other viral warts: Secondary | ICD-10-CM | POA: Diagnosis not present

## 2018-07-25 DIAGNOSIS — A630 Anogenital (venereal) warts: Secondary | ICD-10-CM | POA: Diagnosis not present

## 2018-09-07 ENCOUNTER — Encounter: Payer: Self-pay | Admitting: Family

## 2018-09-07 ENCOUNTER — Other Ambulatory Visit: Payer: Self-pay | Admitting: Family

## 2018-09-07 DIAGNOSIS — M545 Low back pain, unspecified: Secondary | ICD-10-CM

## 2018-09-07 DIAGNOSIS — G8929 Other chronic pain: Secondary | ICD-10-CM

## 2018-09-07 NOTE — Telephone Encounter (Signed)
I looked up patient on [place] Controlled Substances Reporting System and saw no activity that raised concern of inappropriate use.   

## 2018-09-07 NOTE — Telephone Encounter (Addendum)
Relation to DE:[id number]  Call back number:[phone] Pharmacy: [id number] [address], [place]  Reason for call:  Patient stated he will run out today, informed patient please allow 72 hour turn around time, patient voice understanding and would like a follow up call regarding status

## 2018-09-12 ENCOUNTER — Ambulatory Visit: Payer: BLUE CROSS/BLUE SHIELD | Admitting: Family

## 2018-10-21 ENCOUNTER — Encounter: Payer: Self-pay | Admitting: Family

## 2018-10-21 ENCOUNTER — Ambulatory Visit (INDEPENDENT_AMBULATORY_CARE_PROVIDER_SITE_OTHER): Payer: BLUE CROSS/BLUE SHIELD | Admitting: Family

## 2018-10-21 ENCOUNTER — Other Ambulatory Visit: Payer: Self-pay

## 2018-10-21 VITALS — BP 114/80 | HR 70 | Temp 98.8°F | Wt 205.8 lb

## 2018-10-21 DIAGNOSIS — I10 Essential (primary) hypertension: Secondary | ICD-10-CM

## 2018-10-21 DIAGNOSIS — M545 Low back pain, unspecified: Secondary | ICD-10-CM

## 2018-10-21 DIAGNOSIS — G8929 Other chronic pain: Secondary | ICD-10-CM

## 2018-10-21 DIAGNOSIS — K219 Gastro-esophageal reflux disease without esophagitis: Secondary | ICD-10-CM

## 2018-10-21 MED ORDER — SILDENAFIL CITRATE 50 MG PO TABS: 50.0000 mg | ORAL_TABLET | Freq: Every day | ORAL | 1 refills | Status: DC | PRN

## 2018-10-21 MED ORDER — SILDENAFIL CITRATE 50 MG PO TABS: 50.0000 mg | ORAL_TABLET | Freq: Every day | ORAL | 0 refills | Status: DC | PRN

## 2018-10-21 MED ORDER — TRAMADOL HCL 50 MG PO TABS: 50.0000 mg | ORAL_TABLET | Freq: Two times a day (BID) | ORAL | 1 refills | Status: DC | PRN

## 2018-10-21 MED ORDER — OMEPRAZOLE 40 MG PO CPDR: 40.0000 mg | DELAYED_RELEASE_CAPSULE | Freq: Every day | ORAL | 3 refills | Status: DC

## 2018-10-21 MED ORDER — OMEPRAZOLE 20 MG PO CPDR: 20.0000 mg | DELAYED_RELEASE_CAPSULE | Freq: Every day | ORAL | 1 refills | Status: DC

## 2018-10-21 NOTE — Assessment & Plan Note (Signed)
At goal, continue current regimen 

## 2018-10-21 NOTE — Assessment & Plan Note (Addendum)
Symptoms controlled on Prilosec.  One episode of food stuck in his throat.  Discussed long-term risk of PPIs.  We did agree to decrease dose to 40 mg to 20 mg and see if as effective.  Education provided on AVS.

## 2018-10-21 NOTE — Assessment & Plan Note (Signed)
Doing well on current pain regimen.  Using tramadol twice daily, Flexeril as needed.  Will continue at this time. Politely declines repeat of lumbar x-rays, referral to pain management this time.

## 2018-10-21 NOTE — Patient Instructions (Signed)
Long term use beyond 3 months of proton pump inhibitors , also called PPI's, is associated with malabsorption of vitamins, chronic kidney disease, fracture risk, and diarrheal illnesses. PPI's include Nexium, Prilosec, Protonix, Dexilant, and Prevacid.   I generally recommend trying to control acid reflux with lifestyle modifications including avoiding trigger foods, not eating 2 hours prior to bedtime. You may use histamine 2 blockers daily to twice daily ( this is Zantac, Pepcid) and then when symptoms flare, start back on PPI for short course.   Of note, we will need to do an endoscopy ( upper GI) to evaluate your esophagus, stomach in the future if acid reflux persists are you develop red flag symptoms: trouble swallowing, hoarseness, chronic cough, unexplained weight loss.

## 2018-10-21 NOTE — Progress Notes (Signed)
Subjective:    [name], male    DOB: [birth year], 53 y.o.   MRN: [id number]  CC: Jason Hooper is a 53 y.o. male who presents today for follow up.   HPI: Doing well today, no complaints   HTN-Compliant with medications.  no cp, sob  Chronic back pain-well-controlled on current regimen.  Twice per day, Tramadol. Flexeril prn.  No falls, difficulty urinating, lower extremity weakness or numbness.  Lumbar XR 2 years ago-degeneration.  GERD-taking Prilosec 40 mg daily, has recently run out of prescriptions been using over-the-counter 20 mg of Prilosec.  This tends to controls symptoms.  One episode of 'rice getting stuck in throat.' No choking.                                                                                                                                                                                         EGD 2019. NO barrett's esophagus.  HISTORY:  Past Medical History:  Diagnosis Date  . Alcohol abuse   . Allergy   . Bipolar disorder ([place])   . GERD (gastroesophageal reflux disease)   . Kidney stones 2014   Past Surgical History:  Procedure Laterality Date  . COLONOSCOPY WITH PROPOFOL N/A 01/24/2018   Procedure: COLONOSCOPY WITH PROPOFOL;  Surgeon: [name], MD;  Location: [facility] ENDOSCOPY;  Service: Gastroenterology;  Laterality: N/A;  . ESOPHAGEAL DILATION    . ESOPHAGOGASTRODUODENOSCOPY (EGD) WITH PROPOFOL N/A 01/24/2018   Procedure: ESOPHAGOGASTRODUODENOSCOPY (EGD) WITH PROPOFOL;  Surgeon: [name], MD;  Location: [facility] ENDOSCOPY;  Service: Gastroenterology;  Laterality: N/A;  . JOINT REPLACEMENT Right 07/16/2017   R great toe  . TONSILECTOMY/ADENOIDECTOMY WITH MYRINGOTOMY     Family History  Adopted: Yes  Family history unknown: Yes    Allergies: Patient has no known allergies. Current Outpatient Medications on File Prior to Visit  Medication Sig Dispense Refill  . amLODipine (NORVASC) 10 MG tablet Take 1 tablet (10 mg total) by  mouth daily. 90 tablet 3  . cyclobenzaprine (FLEXERIL) 10 MG tablet Take 1 tablet (10 mg total) by mouth at bedtime as needed for muscle spasms. 30 tablet 2  . montelukast (SINGULAIR) 10 MG tablet TAKE 1 TABLET BY MOUTH EVERYDAY AT BEDTIME 90 tablet 2  . propranolol (INDERAL) 40 MG tablet Take 1 tablet (40 mg total) by mouth 2 (two) times daily. 120 tablet 2  . valACYclovir (VALTREX) 500 MG tablet Take 1 tablet (500 mg total) by mouth 2 (two) times daily. 30 tablet 1   No current facility-administered medications on file prior to visit.     Social History   Tobacco Use  .  Smoking status: Never Smoker  . Smokeless tobacco: Former Systems developer    Types: Snuff  Substance Use Topics  . Alcohol use: Yes    Alcohol/week: 1.0 - 2.0 standard drinks    Types: 1 - 2 Cans of beer per week    Comment: occasional  . Drug use: No    Review of Systems  Constitutional: Negative for chills and fever.  HENT: Positive for trouble swallowing (one episode).   Respiratory: Negative for cough.   Cardiovascular: Negative for chest pain and palpitations.  Gastrointestinal: Negative for nausea and vomiting.  Musculoskeletal: Positive for back pain (chronic).      Objective:    BP 114/80 (BP Location: Left Arm, Patient Position: Sitting, Cuff Size: Large)   Pulse 70   Temp 98.8 F (37.1 C)   Wt 205 lb 12.8 oz (93.4 kg)   SpO2 99%   BMI 27.91 kg/m  BP Readings from Last 3 Encounters:  10/21/18 114/80  07/22/18 130/80  05/11/18 (!) 140/95   Wt Readings from Last 3 Encounters:  10/21/18 205 lb 12.8 oz (93.4 kg)  07/22/18 206 lb 6 oz (93.6 kg)  05/11/18 201 lb 8 oz (91.4 kg)    Physical Exam Vitals signs reviewed.  Constitutional:      Appearance: He is well-developed.  Cardiovascular:     Rate and Rhythm: Regular rhythm.     Heart sounds: Normal heart sounds.  Pulmonary:     Effort: Pulmonary effort is normal. No respiratory distress.     Breath sounds: Normal breath sounds. No wheezing,  rhonchi or rales.  Skin:    General: Skin is warm and dry.  Neurological:     Mental Status: He is alert.  Psychiatric:        Speech: Speech normal.        Behavior: Behavior normal.        Assessment & Plan:   Problem List Items Addressed This Visit      Cardiovascular and Mediastinum   HTN (hypertension)    At goal, continue current regimen.      Relevant Medications   sildenafil (VIAGRA) 50 MG tablet     Digestive   GERD (gastroesophageal reflux disease)    Symptoms controlled on Prilosec.  One episode of food stuck in his throat.  Discussed long-term risk of PPIs.  We did agree to decrease dose to 40 mg to 20 mg and see if as effective.  Education provided on AVS.      Relevant Medications   omeprazole (PRILOSEC) 20 MG capsule     Other   Chronic left-sided low back pain without sciatica    Doing well on current pain regimen.  Using tramadol twice daily, Flexeril as needed.  Will continue at this time. Politely declines repeat of lumbar x-rays, referral to pain management this time.          I have changed [name]. Mcpeters "Michael"'s omeprazole. I am also having him maintain his propranolol, valACYclovir, montelukast, amLODipine, cyclobenzaprine, and sildenafil.   Meds ordered this encounter  Medications  . sildenafil (VIAGRA) 50 MG tablet    Sig: Take 1 tablet (50 mg total) by mouth daily as needed for erectile dysfunction. 1 hour prior to sexual activity.    Dispense:  10 tablet    Refill:  1    Order Specific Question:   Supervising Provider    Answer:   Deborra Medina L [[id number]]  . omeprazole (PRILOSEC) 20 MG capsule  Sig: Take 1 capsule (20 mg total) by mouth daily.    Dispense:  90 capsule    Refill:  1    Order Specific Question:   Supervising Provider    Answer:   Crecencio Mc [[id number]]    Return precautions given.   Risks, benefits, and alternatives of the medications and treatment plan prescribed today were discussed, and patient expressed  understanding.   Education regarding symptom management and diagnosis given to patient on AVS.  Continue to follow with Burnard Hawthorne, FNP for routine health maintenance.   [name] and I agreed with plan.   [name], FNP

## 2018-10-30 ENCOUNTER — Other Ambulatory Visit: Payer: Self-pay | Admitting: Family

## 2018-10-30 DIAGNOSIS — G8929 Other chronic pain: Secondary | ICD-10-CM

## 2018-10-30 DIAGNOSIS — M545 Low back pain, unspecified: Secondary | ICD-10-CM

## 2018-11-03 ENCOUNTER — Encounter: Payer: Self-pay | Admitting: Family

## 2018-11-03 DIAGNOSIS — M545 Low back pain: Principal | ICD-10-CM

## 2018-11-03 DIAGNOSIS — G8929 Other chronic pain: Secondary | ICD-10-CM

## 2018-11-04 MED ORDER — TRAMADOL HCL 50 MG PO TABS: 50.0000 mg | ORAL_TABLET | Freq: Two times a day (BID) | ORAL | 1 refills | Status: DC | PRN

## 2018-11-04 NOTE — Telephone Encounter (Signed)
I looked up patient on [place] Controlled Substances Reporting System and saw no activity that raised concern of inappropriate use.   

## 2018-11-05 ENCOUNTER — Encounter: Payer: Self-pay | Admitting: Family

## 2018-11-07 ENCOUNTER — Other Ambulatory Visit: Payer: Self-pay

## 2018-11-07 MED ORDER — OMEPRAZOLE 20 MG PO CPDR: 20.0000 mg | DELAYED_RELEASE_CAPSULE | Freq: Every day | ORAL | 1 refills | Status: DC

## 2018-12-30 ENCOUNTER — Other Ambulatory Visit: Payer: Self-pay | Admitting: Family

## 2018-12-30 DIAGNOSIS — I10 Essential (primary) hypertension: Secondary | ICD-10-CM

## 2019-01-03 ENCOUNTER — Encounter: Payer: Self-pay | Admitting: Family

## 2019-01-04 ENCOUNTER — Other Ambulatory Visit: Payer: Self-pay | Admitting: Family

## 2019-01-04 DIAGNOSIS — M545 Low back pain, unspecified: Secondary | ICD-10-CM

## 2019-01-04 DIAGNOSIS — G8929 Other chronic pain: Secondary | ICD-10-CM

## 2019-01-04 MED ORDER — TRAMADOL HCL 50 MG PO TABS: 50.0000 mg | ORAL_TABLET | Freq: Two times a day (BID) | ORAL | 1 refills | Status: DC | PRN

## 2019-01-04 NOTE — Progress Notes (Signed)
I looked up patient on [place] Controlled Substances Reporting System and saw no activity that raised concern of inappropriate use.   

## 2019-01-23 ENCOUNTER — Other Ambulatory Visit: Payer: Self-pay

## 2019-01-23 ENCOUNTER — Ambulatory Visit (INDEPENDENT_AMBULATORY_CARE_PROVIDER_SITE_OTHER): Payer: BLUE CROSS/BLUE SHIELD | Admitting: Family

## 2019-01-23 DIAGNOSIS — R198 Other specified symptoms and signs involving the digestive system and abdomen: Secondary | ICD-10-CM

## 2019-01-23 NOTE — Progress Notes (Signed)
This visit type was conducted due to national recommendations for restrictions regarding the COVID-19 pandemic (e.g. social distancing).  This format is felt to be most appropriate for this patient at this time.  All issues noted in this document were discussed and addressed.  No physical exam was performed (except for noted visual exam findings with Video Visits). Virtual Visit via Video Note  I connected with@  on 01/27/19 at  2:00 PM EDT by a video enabled telemedicine application and verified that I am speaking with the correct person using two identifiers.  Location patient: home Location provider:work  Persons participating in the virtual visit: patient, provider  I discussed the limitations of evaluation and management by telemedicine and the availability of in person appointments. The patient expressed understanding and agreed to proceed.   HPI:  Thinks may have 'prostate problem'.Painful when sitting x 4 weeks, waxes and wanes. Most of the time the buttocks area feels 'Uncomfortable' after BM. No fever, N,v .  Feels he is not fully empty after BM. In middle of night, noted urine stream appears slow, however this is not noticed during the daytime.  No constipation. 'feels normal with regard to bowel movements.' No straining with BM. No hemorrhoids.   No concerns for stds. No burning with urination, unusual discharge, testicular swelling or testicular pain    HTN- at home, 115/85 at home. No cp.   GERD- stable. 20mg  prilosec. NO choking, trouble swalloiwing.   Chronic low back pain- had been 'good' until last week, since bought a new house this past month. Shooting pain down left leg from buttucks to knee for past couple of weeks, waxing and wanes. NO numbness. Had been painting yesterday which exacerbated his symptoms. using tramadol twice a day, Flexeril as needed.   ROS: See pertinent positives and negatives per HPI.  Past Medical History:  Diagnosis Date  . Alcohol abuse    . Allergy   . Bipolar disorder ([place])   . GERD (gastroesophageal reflux disease)   . Kidney stones 2014    Past Surgical History:  Procedure Laterality Date  . COLONOSCOPY WITH PROPOFOL N/A 01/24/2018   Procedure: COLONOSCOPY WITH PROPOFOL;  Surgeon: [name], MD;  Location: [facility] ENDOSCOPY;  Service: Gastroenterology;  Laterality: N/A;  . ESOPHAGEAL DILATION    . ESOPHAGOGASTRODUODENOSCOPY (EGD) WITH PROPOFOL N/A 01/24/2018   Procedure: ESOPHAGOGASTRODUODENOSCOPY (EGD) WITH PROPOFOL;  Surgeon: [name], MD;  Location: [facility] ENDOSCOPY;  Service: Gastroenterology;  Laterality: N/A;  . JOINT REPLACEMENT Right 07/16/2017   R great toe  . TONSILECTOMY/ADENOIDECTOMY WITH MYRINGOTOMY      Family History  Adopted: Yes  Family history unknown: Yes    SOCIAL HX: non smoker.    Current Outpatient Medications:  .  amLODipine (NORVASC) 10 MG tablet, Take 1 tablet (10 mg total) by mouth daily., Disp: 90 tablet, Rfl: 3 .  cyclobenzaprine (FLEXERIL) 10 MG tablet, TAKE 1 TABLET BY MOUTH AT BEDTIME AS NEEDED FOR MUSCLE SPASMS, Disp: 30 tablet, Rfl: 2 .  montelukast (SINGULAIR) 10 MG tablet, TAKE 1 TABLET BY MOUTH EVERYDAY AT BEDTIME, Disp: 90 tablet, Rfl: 2 .  omeprazole (PRILOSEC) 20 MG capsule, Take 1 capsule (20 mg total) by mouth daily., Disp: 90 capsule, Rfl: 1 .  propranolol (INDERAL) 40 MG tablet, TAKE 1 TABLET BY MOUTH TWICE A DAY, Disp: 60 tablet, Rfl: 3 .  sildenafil (VIAGRA) 50 MG tablet, Take 1 tablet (50 mg total) by mouth daily as needed for erectile dysfunction. 1 hour prior to sexual activity.,  Disp: 10 tablet, Rfl: 1 .  traMADol (ULTRAM) 50 MG tablet, Take 1 tablet (50 mg total) by mouth 2 (two) times daily as needed., Disp: 60 tablet, Rfl: 1 .  valACYclovir (VALTREX) 500 MG tablet, Take 1 tablet (500 mg total) by mouth 2 (two) times daily., Disp: 30 tablet, Rfl: 1  EXAM:  VITALS per patient if applicable:  GENERAL: alert, oriented, appears well and in no acute  distress  HEENT: atraumatic, conjunttiva clear, no obvious abnormalities on inspection of external nose and ears  NECK: normal movements of the head and neck  LUNGS: on inspection no signs of respiratory distress, breathing rate appears normal, no obvious gross SOB, gasping or wheezing  CV: no obvious cyanosis  MS: moves all visible extremities without noticeable abnormality  PSYCH/NEURO: pleasant and cooperative, no obvious depression or anxiety, speech and thought processing grossly intact  ASSESSMENT AND PLAN:  Discussed the following assessment and plan:  Tenesmus - Plan: Urine cytology ancillary only([place]), Urinalysis, Routine w reflex microscopic, Urine Culture, PSA, Comprehensive metabolic panel, CBC with Differential/Platelet, Sedimentation rate, C-reactive protein Problem List Items Addressed This Visit      Other   Tenesmus - Primary    Etiology unclear at this time.  Over video, patient was well-appearing, did not appear toxic in appearance.  The symptom has been coming and going.  differentials include proctalgia fugax ( ? Nerve compression), prostatitis.  Patient did decline a sacral and lumbar MRI today.  He prefers to start with urine, lab.  Will consider consult with urology, GI based on labs, patient symptoms.   Advised close follow-up and to let me know of any new or worsening symptoms      Relevant Orders   Urine cytology ancillary only([place])   Urinalysis, Routine w reflex microscopic   Urine Culture   PSA   Comprehensive metabolic panel   CBC with Differential/Platelet   Sedimentation rate   C-reactive protein         I discussed the assessment and treatment plan with the patient. The patient was provided an opportunity to ask questions and all were answered. The patient agreed with the plan and demonstrated an understanding of the instructions.   The patient was advised to call back or seek an in-person evaluation if the symptoms worsen  or if the condition fails to improve as anticipated.   [name], FNP

## 2019-01-23 NOTE — Assessment & Plan Note (Addendum)
Etiology unclear at this time.  Over video, patient was well-appearing, did not appear toxic in appearance.  The symptom has been coming and going.  differentials include proctalgia fugax ( ? Nerve compression), prostatitis.  Patient did decline a sacral and lumbar MRI today.  He prefers to start with urine, lab.  Will consider consult with urology, GI based on labs, patient symptoms.   Advised close follow-up and to let me know of any new or worsening symptoms

## 2019-01-25 NOTE — Progress Notes (Signed)
Called and lf msg on voicemail for the pt to call back and schedule labs for this week.  The pt will also need a cup for a urine test.  I explained this on voicemail. Caelen Reierson,cma

## 2019-01-27 ENCOUNTER — Encounter: Payer: Self-pay | Admitting: Family

## 2019-01-27 NOTE — Progress Notes (Signed)
Patient scheduled labs for Thursday 02/02/19.

## 2019-02-02 ENCOUNTER — Other Ambulatory Visit: Payer: Self-pay

## 2019-02-02 ENCOUNTER — Other Ambulatory Visit (HOSPITAL_COMMUNITY)
Admission: RE | Admit: 2019-02-02 | Discharge: 2019-02-02 | Disposition: A | Discharge status: Home / Self Care | Payer: BLUE CROSS/BLUE SHIELD | Source: Ambulatory Visit | Attending: Family | Admitting: Family

## 2019-02-02 ENCOUNTER — Telehealth: Payer: Self-pay | Admitting: Radiology

## 2019-02-02 ENCOUNTER — Other Ambulatory Visit (INDEPENDENT_AMBULATORY_CARE_PROVIDER_SITE_OTHER): Payer: BLUE CROSS/BLUE SHIELD

## 2019-02-02 DIAGNOSIS — R198 Other specified symptoms and signs involving the digestive system and abdomen: Secondary | ICD-10-CM | POA: Diagnosis not present

## 2019-02-02 LAB — CBC WITH DIFFERENTIAL/PLATELET
Basophils Absolute: 0 10*3/uL (ref 0.0–0.1)
Basophils Relative: 0.6 % (ref 0.0–3.0)
Eosinophils Absolute: 0.2 10*3/uL (ref 0.0–0.7)
Eosinophils Relative: 2.7 % (ref 0.0–5.0)
HCT: 42.3 % (ref 39.0–52.0)
Hemoglobin: 14.5 g/dL (ref 13.0–17.0)
Lymphocytes Relative: 24.2 % (ref 12.0–46.0)
Lymphs Abs: 1.6 10*3/uL (ref 0.7–4.0)
MCHC: 34.2 g/dL (ref 30.0–36.0)
MCV: 93.2 fl (ref 78.0–100.0)
Monocytes Absolute: 0.6 10*3/uL (ref 0.1–1.0)
Monocytes Relative: 8.9 % (ref 3.0–12.0)
Neutro Abs: 4.3 10*3/uL (ref 1.4–7.7)
Neutrophils Relative %: 63.6 % (ref 43.0–77.0)
Platelets: 246 10*3/uL (ref 150.0–400.0)
RBC: 4.53 Mil/uL (ref 4.22–5.81)
RDW: 13.2 % (ref 11.5–15.5)
WBC: 6.7 10*3/uL (ref 4.0–10.5)

## 2019-02-02 LAB — COMPREHENSIVE METABOLIC PANEL
ALT: 28 U/L (ref 0–53)
AST: 19 U/L (ref 0–37)
Albumin: 4.6 g/dL (ref 3.5–5.2)
Alkaline Phosphatase: 70 U/L (ref 39–117)
BUN: 16 mg/dL (ref 6–23)
CO2: 30 mEq/L (ref 19–32)
Calcium: 9.2 mg/dL (ref 8.4–10.5)
Chloride: 101 mEq/L (ref 96–112)
Creatinine, Ser: 0.88 mg/dL (ref 0.40–1.50)
GFR: 90.34 mL/min (ref >60.00)
Glucose, Bld: 129 mg/dL — ABNORMAL HIGH (ref 70–99)
Potassium: 4.2 mEq/L (ref 3.5–5.1)
Sodium: 139 mEq/L (ref 135–145)
Total Bilirubin: 0.5 mg/dL (ref 0.2–1.2)
Total Protein: 6.9 g/dL (ref 6.0–8.3)

## 2019-02-02 LAB — PSA: PSA: 0.35 ng/mL (ref 0.10–4.00)

## 2019-02-02 LAB — C-REACTIVE PROTEIN: CRP: <1 mg/dL (ref 0.5–20.0)

## 2019-02-02 LAB — SEDIMENTATION RATE: Sed Rate: 6 mm/hr (ref 0–20)

## 2019-02-02 NOTE — Telephone Encounter (Signed)
Pt came in for lab visit. Advised pt that provider had ordered lab work and urine. Pt stated he was only made aware of urine tests. Apologized to pt and advised pt that provider did place lab orders as well. Pt stated that was not made clear to him by CMA or provider. Pt stated he will do blood work today but stated that needs to be made aware next time. Apologized to pt again for miscommunication.

## 2019-02-02 NOTE — Addendum Note (Signed)
Addended by: Arby Barrette on: 02/02/2019 02:06 PM   Modules accepted: Orders

## 2019-02-03 LAB — URINALYSIS, ROUTINE W REFLEX MICROSCOPIC
Bilirubin, UA: NEGATIVE
Glucose, UA: NEGATIVE
Ketones, UA: NEGATIVE
Leukocytes,UA: NEGATIVE
Nitrite, UA: NEGATIVE
Protein,UA: NEGATIVE
RBC, UA: NEGATIVE
Specific Gravity, UA: 1.019 (ref 1.005–1.030)
Urobilinogen, Ur: 0.2 mg/dL (ref 0.2–1.0)
pH, UA: 6.5 (ref 5.0–7.5)

## 2019-02-03 LAB — URINE CYTOLOGY ANCILLARY ONLY
Chlamydia: NEGATIVE
Neisseria Gonorrhea: NEGATIVE
Trichomonas: NEGATIVE

## 2019-02-03 NOTE — Telephone Encounter (Signed)
Noted. When I called patient I thought I stated that it was for labs & urine per Margaret's note. I hate there was confusion & miscommunication.

## 2019-02-04 LAB — URINE CULTURE: Organism ID, Bacteria: NO GROWTH

## 2019-02-06 LAB — URINE CYTOLOGY ANCILLARY ONLY: Candida vaginitis: NEGATIVE

## 2019-03-01 ENCOUNTER — Encounter: Payer: Self-pay | Admitting: Family

## 2019-03-01 DIAGNOSIS — M545 Low back pain, unspecified: Secondary | ICD-10-CM

## 2019-03-01 DIAGNOSIS — G8929 Other chronic pain: Secondary | ICD-10-CM

## 2019-03-03 MED ORDER — TRAMADOL HCL 50 MG PO TABS: 50.0000 mg | ORAL_TABLET | Freq: Two times a day (BID) | ORAL | 1 refills | Status: DC | PRN

## 2019-03-03 NOTE — Telephone Encounter (Signed)
I looked up patient on [place] Controlled Substances Reporting System and saw no activity that raised concern of inappropriate use.   

## 2019-03-09 ENCOUNTER — Encounter: Payer: Self-pay | Admitting: Family

## 2019-03-21 ENCOUNTER — Other Ambulatory Visit: Payer: Self-pay | Admitting: Family

## 2019-03-21 DIAGNOSIS — G8929 Other chronic pain: Secondary | ICD-10-CM

## 2019-03-21 DIAGNOSIS — M545 Low back pain, unspecified: Secondary | ICD-10-CM

## 2019-03-24 DIAGNOSIS — R0602 Shortness of breath: Secondary | ICD-10-CM | POA: Diagnosis not present

## 2019-03-24 DIAGNOSIS — R079 Chest pain, unspecified: Secondary | ICD-10-CM | POA: Diagnosis not present

## 2019-03-24 DIAGNOSIS — R58 Hemorrhage, not elsewhere classified: Secondary | ICD-10-CM | POA: Diagnosis not present

## 2019-03-24 DIAGNOSIS — I10 Essential (primary) hypertension: Secondary | ICD-10-CM | POA: Diagnosis not present

## 2019-03-25 ENCOUNTER — Emergency Department (HOSPITAL_COMMUNITY): Payer: BC Managed Care – PPO

## 2019-03-25 ENCOUNTER — Emergency Department (HOSPITAL_COMMUNITY)
Admission: EM | Admit: 2019-03-25 | Discharge: 2019-03-25 | Disposition: A | Discharge status: Home / Self Care | Payer: BC Managed Care – PPO | Attending: Emergency Medicine | Admitting: Emergency Medicine

## 2019-03-25 ENCOUNTER — Encounter (HOSPITAL_COMMUNITY): Payer: Self-pay | Admitting: Emergency Medicine

## 2019-03-25 DIAGNOSIS — S2242XA Multiple fractures of ribs, left side, initial encounter for closed fracture: Secondary | ICD-10-CM | POA: Diagnosis not present

## 2019-03-25 DIAGNOSIS — S20212A Contusion of left front wall of thorax, initial encounter: Secondary | ICD-10-CM | POA: Insufficient documentation

## 2019-03-25 DIAGNOSIS — S0181XA Laceration without foreign body of other part of head, initial encounter: Secondary | ICD-10-CM | POA: Insufficient documentation

## 2019-03-25 DIAGNOSIS — S199XXA Unspecified injury of neck, initial encounter: Secondary | ICD-10-CM | POA: Diagnosis not present

## 2019-03-25 DIAGNOSIS — S0231XA Fracture of orbital floor, right side, initial encounter for closed fracture: Secondary | ICD-10-CM | POA: Insufficient documentation

## 2019-03-25 DIAGNOSIS — Y939 Activity, unspecified: Secondary | ICD-10-CM | POA: Insufficient documentation

## 2019-03-25 DIAGNOSIS — Y999 Unspecified external cause status: Secondary | ICD-10-CM | POA: Insufficient documentation

## 2019-03-25 DIAGNOSIS — R109 Unspecified abdominal pain: Secondary | ICD-10-CM | POA: Diagnosis not present

## 2019-03-25 DIAGNOSIS — S022XXA Fracture of nasal bones, initial encounter for closed fracture: Secondary | ICD-10-CM

## 2019-03-25 DIAGNOSIS — S0191XA Laceration without foreign body of unspecified part of head, initial encounter: Secondary | ICD-10-CM | POA: Diagnosis not present

## 2019-03-25 DIAGNOSIS — S3991XA Unspecified injury of abdomen, initial encounter: Secondary | ICD-10-CM | POA: Diagnosis not present

## 2019-03-25 DIAGNOSIS — S0990XA Unspecified injury of head, initial encounter: Secondary | ICD-10-CM | POA: Diagnosis not present

## 2019-03-25 DIAGNOSIS — Y929 Unspecified place or not applicable: Secondary | ICD-10-CM | POA: Insufficient documentation

## 2019-03-25 DIAGNOSIS — T07XXXA Unspecified multiple injuries, initial encounter: Secondary | ICD-10-CM | POA: Diagnosis not present

## 2019-03-25 DIAGNOSIS — S3993XA Unspecified injury of pelvis, initial encounter: Secondary | ICD-10-CM | POA: Diagnosis not present

## 2019-03-25 LAB — CBC WITH DIFFERENTIAL/PLATELET
Abs Immature Granulocytes: 0.05 10*3/uL (ref 0.00–0.07)
Basophils Absolute: 0 10*3/uL (ref 0.0–0.1)
Basophils Relative: 0 %
Eosinophils Absolute: 0.2 10*3/uL (ref 0.0–0.5)
Eosinophils Relative: 2 %
HCT: 39.8 % (ref 39.0–52.0)
Hemoglobin: 13.7 g/dL (ref 13.0–17.0)
Immature Granulocytes: 1 %
Lymphocytes Relative: 16 %
Lymphs Abs: 1.5 10*3/uL (ref 0.7–4.0)
MCH: 31.6 pg (ref 26.0–34.0)
MCHC: 34.4 g/dL (ref 30.0–36.0)
MCV: 91.7 fL (ref 80.0–100.0)
Monocytes Absolute: 0.7 10*3/uL (ref 0.1–1.0)
Monocytes Relative: 8 %
Neutro Abs: 6.8 10*3/uL (ref 1.7–7.7)
Neutrophils Relative %: 73 %
Platelets: 241 10*3/uL (ref 150–400)
RBC: 4.34 MIL/uL (ref 4.22–5.81)
RDW: 11.9 % (ref 11.5–15.5)
WBC: 9.3 10*3/uL (ref 4.0–10.5)
nRBC: 0 % (ref 0.0–0.2)

## 2019-03-25 LAB — BASIC METABOLIC PANEL
Anion gap: 9 (ref 5–15)
BUN: 20 mg/dL (ref 6–20)
CO2: 26 mmol/L (ref 22–32)
Calcium: 9.2 mg/dL (ref 8.9–10.3)
Chloride: 103 mmol/L (ref 98–111)
Creatinine, Ser: 1.02 mg/dL (ref 0.61–1.24)
GFR calc Af Amer: >60 mL/min (ref >60)
GFR calc non Af Amer: >60 mL/min (ref >60)
Glucose, Bld: 149 mg/dL — ABNORMAL HIGH (ref 70–99)
Potassium: 3.6 mmol/L (ref 3.5–5.1)
Sodium: 138 mmol/L (ref 135–145)

## 2019-03-25 MED ORDER — IOHEXOL 300 MG/ML  SOLN
100.0000 mL | Freq: Once | INTRAMUSCULAR | Status: AC | PRN
  Administered 2019-03-25: 100 mL via INTRAVENOUS

## 2019-03-25 MED ORDER — OXYCODONE-ACETAMINOPHEN 5-325 MG PO TABS: 1.0000 | ORAL_TABLET | Freq: Four times a day (QID) | ORAL | 0 refills | Status: DC | PRN

## 2019-03-25 MED ORDER — SODIUM CHLORIDE 0.9 % IV BOLUS
1000.0000 mL | Freq: Once | INTRAVENOUS | Status: AC
  Administered 2019-03-25: 1000 mL via INTRAVENOUS

## 2019-03-25 MED ORDER — ONDANSETRON HCL 4 MG/2ML IJ SOLN
4.0000 mg | Freq: Once | INTRAMUSCULAR | Status: AC
  Administered 2019-03-25: 4 mg via INTRAVENOUS
  Filled 2019-03-25: qty 2

## 2019-03-25 MED ORDER — MORPHINE SULFATE (PF) 4 MG/ML IV SOLN
4.0000 mg | Freq: Once | INTRAVENOUS | Status: AC
  Administered 2019-03-25: 4 mg via INTRAVENOUS
  Filled 2019-03-25: qty 1

## 2019-03-25 NOTE — ED Notes (Signed)
Patient transported to CT scan . 

## 2019-03-25 NOTE — ED Notes (Signed)
MOTHER SANDRA [place]orga [phone] PLEASE KEEP HER UPDATED.
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
## 2019-03-25 NOTE — Discharge Instructions (Signed)
Percocet as prescribed as needed for pain.  Apply ice to affected areas 20 minutes every 2 hours while awake for the next 2 days.  Follow-up with facial trauma early next week.  The contact information for Dr. Blenda Nicely has been provided in this discharge summary for you to call and make these arrangements.

## 2019-03-25 NOTE — ED Notes (Signed)
EDP explained tests results and plan of care to pt.  

## 2019-03-25 NOTE — ED Notes (Signed)
Patient's family updated by RN on pt.'s condition and plan of care .
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
## 2019-03-29 ENCOUNTER — Encounter: Payer: Self-pay | Admitting: Family

## 2019-03-30 ENCOUNTER — Ambulatory Visit (INDEPENDENT_AMBULATORY_CARE_PROVIDER_SITE_OTHER): Payer: BC Managed Care – PPO | Admitting: Family Medicine

## 2019-03-30 ENCOUNTER — Other Ambulatory Visit: Payer: Self-pay

## 2019-03-30 DIAGNOSIS — M79605 Pain in left leg: Secondary | ICD-10-CM

## 2019-03-30 DIAGNOSIS — S0231XS Fracture of orbital floor, right side, sequela: Secondary | ICD-10-CM | POA: Diagnosis not present

## 2019-03-30 DIAGNOSIS — S022XXS Fracture of nasal bones, sequela: Secondary | ICD-10-CM

## 2019-03-30 DIAGNOSIS — S2242XS Multiple fractures of ribs, left side, sequela: Secondary | ICD-10-CM | POA: Diagnosis not present

## 2019-03-30 DIAGNOSIS — S022XXA Fracture of nasal bones, initial encounter for closed fracture: Secondary | ICD-10-CM | POA: Diagnosis not present

## 2019-03-30 DIAGNOSIS — J3489 Other specified disorders of nose and nasal sinuses: Secondary | ICD-10-CM | POA: Diagnosis not present

## 2019-03-30 DIAGNOSIS — S0231XA Fracture of orbital floor, right side, initial encounter for closed fracture: Secondary | ICD-10-CM | POA: Diagnosis not present

## 2019-03-30 MED ORDER — TRAMADOL HCL 50 MG PO TABS: 50.0000 mg | ORAL_TABLET | Freq: Four times a day (QID) | ORAL | 1 refills | Status: DC | PRN

## 2019-03-30 MED ORDER — OXYCODONE-ACETAMINOPHEN 5-325 MG PO TABS: 1.0000 | ORAL_TABLET | Freq: Four times a day (QID) | ORAL | 0 refills | Status: DC | PRN
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
## 2019-03-30 NOTE — Progress Notes (Signed)
   Subjective:    [name], male    DOB: 06/24/1966, 53 y.o.   MRN: [id number]  HPI    Review of Systems     Objective:   Physical Exam        Assessment & Plan:
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
## 2019-03-31 ENCOUNTER — Ambulatory Visit (INDEPENDENT_AMBULATORY_CARE_PROVIDER_SITE_OTHER): Payer: BC Managed Care – PPO

## 2019-03-31 ENCOUNTER — Telehealth: Payer: Self-pay

## 2019-03-31 ENCOUNTER — Telehealth: Payer: Self-pay | Admitting: Family

## 2019-03-31 ENCOUNTER — Ambulatory Visit
Admission: RE | Admit: 2019-03-31 | Discharge: 2019-03-31 | Disposition: A | Discharge status: Home / Self Care | Payer: BC Managed Care – PPO | Source: Ambulatory Visit | Attending: Family | Admitting: Family

## 2019-03-31 ENCOUNTER — Other Ambulatory Visit: Payer: Self-pay

## 2019-03-31 DIAGNOSIS — M79605 Pain in left leg: Secondary | ICD-10-CM

## 2019-03-31 DIAGNOSIS — M7989 Other specified soft tissue disorders: Secondary | ICD-10-CM | POA: Insufficient documentation

## 2019-03-31 DIAGNOSIS — S8012XA Contusion of left lower leg, initial encounter: Secondary | ICD-10-CM | POA: Diagnosis not present
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
## 2019-03-31 NOTE — Telephone Encounter (Signed)
Spoke with patient  Swelling from left knee to top of foot for past 3 days more noticeable.   Scab on lateral side  No heat, streaking, or purulent discharge.   Pain with walking. Skin feels taut.   Color of LLE is red to brown.   Has been elevating in recliner, 'not above heart'. Using ice some with temporary relief.   NO numbness. No sob, cp.   Advised that likely hematoma that he has not been aggressive in regards to ice, elevation of the head.  Discussed at length limitations telemedicine as not able to see patient's leg today.  Will pick up percocet tomorrow. He feels reassured knowing there is no blood clot or fracture.  He will stay vigilant in regards to any signs or symptoms to suggest infection.   He would like to be aggressive with elevation, icing regimen   if this does not improve pain, he understands advises he goes to be seen in the emergency room over the weekend.

## 2019-03-31 NOTE — Telephone Encounter (Signed)
Please call and give verbal for oxycodone and tramadol written by Philis Nettle to be filled. Last filled 03/25/19   I looked up patient on [place] Controlled Substances Reporting System and saw no activity that raised concern of inappropriate use.   03/25/2019 Oxycodone-Acetaminophen 5-325 20.00  Filled by ED.

## 2019-03-31 NOTE — Telephone Encounter (Signed)
I spoke with patient & he knows that Lenna Sciara should be giving him a call about stat US. I have advised patient on below.

## 2019-03-31 NOTE — Telephone Encounter (Signed)
Pt heading over now to have it done. They will call Margaret's cell with the results. [employer]
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
## 2019-04-03 ENCOUNTER — Telehealth: Payer: Self-pay | Admitting: Family

## 2019-04-03 DIAGNOSIS — S0231XA Fracture of orbital floor, right side, initial encounter for closed fracture: Secondary | ICD-10-CM | POA: Diagnosis not present

## 2019-04-03 DIAGNOSIS — S022XXA Fracture of nasal bones, initial encounter for closed fracture: Secondary | ICD-10-CM | POA: Diagnosis not present

## 2019-04-03 DIAGNOSIS — J3489 Other specified disorders of nose and nasal sinuses: Secondary | ICD-10-CM | POA: Diagnosis not present

## 2019-04-03 DIAGNOSIS — Z0279 Encounter for issue of other medical certificate: Secondary | ICD-10-CM

## 2019-04-03 NOTE — Telephone Encounter (Signed)
Pt dropped off form to be filled out. Pt will discuss this tomorrow with Lauren at his appt Placed forms in [employer] upfront

## 2019-04-04 ENCOUNTER — Ambulatory Visit (INDEPENDENT_AMBULATORY_CARE_PROVIDER_SITE_OTHER): Payer: BC Managed Care – PPO | Admitting: Family Medicine

## 2019-04-04 ENCOUNTER — Encounter: Payer: Self-pay | Admitting: Family Medicine

## 2019-04-04 ENCOUNTER — Other Ambulatory Visit: Payer: Self-pay

## 2019-04-04 VITALS — BP 122/84 | HR 79 | Temp 98.6°F | Resp 16 | Ht 72.0 in | Wt 200.6 lb

## 2019-04-04 DIAGNOSIS — S022XXS Fracture of nasal bones, sequela: Secondary | ICD-10-CM

## 2019-04-04 DIAGNOSIS — S0231XS Fracture of orbital floor, right side, sequela: Secondary | ICD-10-CM | POA: Diagnosis not present

## 2019-04-04 DIAGNOSIS — L03116 Cellulitis of left lower limb: Secondary | ICD-10-CM

## 2019-04-04 DIAGNOSIS — M79605 Pain in left leg: Secondary | ICD-10-CM | POA: Diagnosis not present

## 2019-04-04 DIAGNOSIS — S2242XS Multiple fractures of ribs, left side, sequela: Secondary | ICD-10-CM

## 2019-04-04 DIAGNOSIS — H43391 Other vitreous opacities, right eye: Secondary | ICD-10-CM | POA: Diagnosis not present

## 2019-04-04 MED ORDER — CEPHALEXIN 500 MG PO CAPS: 500.0000 mg | ORAL_CAPSULE | Freq: Four times a day (QID) | ORAL | 0 refills | Status: DC

## 2019-04-04 NOTE — Telephone Encounter (Signed)
Form was given to NP Philis Nettle to fill out.
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
## 2019-04-07 ENCOUNTER — Ambulatory Visit: Payer: BC Managed Care – PPO | Admitting: Family

## 2019-04-12 ENCOUNTER — Ambulatory Visit: Payer: BC Managed Care – PPO | Admitting: Family

## 2019-04-19 ENCOUNTER — Ambulatory Visit (INDEPENDENT_AMBULATORY_CARE_PROVIDER_SITE_OTHER): Payer: BC Managed Care – PPO | Admitting: Family

## 2019-04-19 ENCOUNTER — Encounter: Payer: Self-pay | Admitting: Family

## 2019-04-19 ENCOUNTER — Other Ambulatory Visit: Payer: Self-pay | Admitting: Family

## 2019-04-19 DIAGNOSIS — R600 Localized edema: Secondary | ICD-10-CM

## 2019-04-19 DIAGNOSIS — L03116 Cellulitis of left lower limb: Secondary | ICD-10-CM | POA: Diagnosis not present

## 2019-04-19 MED ORDER — CEPHALEXIN 500 MG PO CAPS: 500.0000 mg | ORAL_CAPSULE | Freq: Four times a day (QID) | ORAL | 0 refills | Status: DC
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
## 2019-04-19 NOTE — Patient Instructions (Signed)
Restart keflex to cover for any bacterial infection  Ensure to take probiotics while on antibiotics and also for 2 weeks after completion. It is important to re-colonize the gut with good bacteria and also to prevent any diarrheal infections associated with antibiotic use.    Please stay VERY vigilant in regards to any worsening of swelling, asymetry of calves, redness, heat, and let me know  Continue elevation, ice  Today we discussed referrals, orders. orthopedics   I have placed these orders in the system for you.  Please be sure to give [place] a call if you have not heard from our office regarding this. We should hear from [place] within ONE week with information regarding your appointment. If not, please let me know immediately.    Let me know if you need anything at all   Cellulitis, Adult  Cellulitis is a skin infection. The infected area is often warm, red, swollen, and sore. It occurs most often in the arms and lower legs. It is very important to get treated for this condition. What are the causes? This condition is caused by bacteria. The bacteria enter through a break in the skin, such as a cut, burn, insect bite, open sore, or crack. What increases the risk? This condition is more likely to occur in people who:  Have a weak body defense system (immune system).  Have open cuts, burns, bites, or scrapes on the skin.  Are older than 54 years of age.  Have a blood sugar problem (diabetes).  Have a long-lasting (chronic) liver disease (cirrhosis) or kidney disease.  Are very overweight (obese).  Have a skin problem, such as: ? Itchy rash (eczema). ? Slow movement of blood in the veins (venous stasis). ? Fluid buildup below the skin (edema).  Have been treated with high-energy rays (radiation).  Use IV drugs. What are the signs or symptoms? Symptoms of this condition include:  Skin that is: ? Red. ? Streaking. ? Spotting. ? Swollen. ? Sore or painful when you touch  it. ? Warm.  A fever.  Chills.  Blisters. How is this diagnosed? This condition is diagnosed based on:  Medical history.  Physical exam.  Blood tests.  Imaging tests. How is this treated? Treatment for this condition may include:  Medicines to treat infections or allergies.  Home care, such as: ? Rest. ? Placing cold or warm cloths (compresses) on the skin.  Hospital care, if the condition is very bad. Follow these instructions at home: Medicines  Take over-the-counter and prescription medicines only as told by your doctor.  If you were prescribed an antibiotic medicine, take it as told by your doctor. Do not stop taking it even if you start to feel better. General instructions   Drink enough fluid to keep your pee (urine) pale yellow.  Do not touch or rub the infected area.  Raise (elevate) the infected area above the level of your heart while you are sitting or lying down.  Place cold or warm cloths on the area as told by your doctor.  Keep all follow-up visits as told by your doctor. This is important. Contact a doctor if:  You have a fever.  You do not start to get better after 1-2 days of treatment.  Your bone or joint under the infected area starts to hurt after the skin has healed.  Your infection comes back. This can happen in the same area or another area.  You have a swollen bump in the area.  You have new symptoms.  You feel ill and have muscle aches and pains. Get help right away if:  Your symptoms get worse.  You feel very sleepy.  You throw up (vomit) or have watery poop (diarrhea) for a long time.  You see red streaks coming from the area.  Your red area gets larger.  Your red area turns dark in color. These symptoms may represent a serious problem that is an emergency. Do not wait to see if the symptoms will go away. Get medical help right away. Call your local emergency services (911 in the [place] Do not drive yourself to the  hospital. Summary  Cellulitis is a skin infection. The area is often warm, red, swollen, and sore.  This condition is treated with medicines, rest, and cold and warm cloths.  Take all medicines only as told by your doctor.  Tell your doctor if symptoms do not start to get better after 1-2 days of treatment. This information is not intended to replace advice given to you by your health care provider. Make sure you discuss any questions you have with your health care provider. Document Released: 03/16/2008 Document Revised: 02/17/2018 Document Reviewed: 02/17/2018 Elsevier Patient Education  2020 [employer].

## 2019-04-20 ENCOUNTER — Encounter: Payer: Self-pay | Admitting: Family

## 2019-04-20 ENCOUNTER — Telehealth: Payer: Self-pay

## 2019-04-20 NOTE — Telephone Encounter (Signed)
Copied from [place] [id number]. Topic: General - Inquiry >> Apr 20, 2019  2:03 PM Virl Axe D wrote: Reason for CRM: Pt stated that disability paperwork is being faxed over from [employer]. They will be looking for a return to work date and he would like to have 05/01/19 noted. Please advise.

## 2019-04-21 ENCOUNTER — Telehealth: Payer: Self-pay | Admitting: *Deleted

## 2019-04-21 ENCOUNTER — Other Ambulatory Visit: Payer: Self-pay | Admitting: Family

## 2019-04-21 ENCOUNTER — Other Ambulatory Visit: Payer: Self-pay

## 2019-04-21 ENCOUNTER — Ambulatory Visit
Admission: RE | Admit: 2019-04-21 | Discharge: 2019-04-21 | Disposition: A | Discharge status: Home / Self Care | Payer: BC Managed Care – PPO | Source: Ambulatory Visit | Attending: Family | Admitting: Family

## 2019-04-21 DIAGNOSIS — R600 Localized edema: Secondary | ICD-10-CM | POA: Insufficient documentation

## 2019-04-21 NOTE — Telephone Encounter (Signed)
I have tried to call patient & let him know that mychart message was received. Joycelyn Schmid is concerned of increased swelling & wants him to have another [place] STAT. This has been ordered & scheduled at [place] todat 3p & arrival time 2:45p. I left long VM explaining this to patient. I will continue to try to contact.

## 2019-04-21 NOTE — Telephone Encounter (Signed)
Jason Hooper from ultrasound calling to report that stat LLE ultrasound results were available in Epic.No critical results reported at this time.

## 2019-04-21 NOTE — Telephone Encounter (Signed)
I spoke with patient & he is aware of appointment today for [place] at [facility]. He will wait on referral to go to ortho. & does not want xray at this time. He will wait on results of [place]. He stated that his swelling was actually better today.

## 2019-04-21 NOTE — Telephone Encounter (Signed)
Have attempted to reach patient twice, but have been unsuccessful. I left another VM to call back.

## 2019-04-24 NOTE — Telephone Encounter (Signed)
noted 

## 2019-04-25 ENCOUNTER — Other Ambulatory Visit: Payer: Self-pay | Admitting: Family

## 2019-04-25 DIAGNOSIS — I10 Essential (primary) hypertension: Secondary | ICD-10-CM

## 2019-05-05 ENCOUNTER — Telehealth: Payer: Self-pay | Admitting: Family

## 2019-05-05 NOTE — Telephone Encounter (Signed)
Pt came in to have paperwork filled out. Paperwork is in color folder up front. Thank you!

## 2019-05-08 ENCOUNTER — Telehealth: Payer: Self-pay | Admitting: Family

## 2019-05-08 DIAGNOSIS — N281 Cyst of kidney, acquired: Secondary | ICD-10-CM

## 2019-05-08 DIAGNOSIS — R911 Solitary pulmonary nodule: Secondary | ICD-10-CM | POA: Insufficient documentation

## 2019-05-08 NOTE — Telephone Encounter (Signed)
Call pt [place] paperwork done  Please review the below:    In reviewing images from ED, 1) a pulmonary nodule was seen on CT chest.  7 mm pulmonary nodule in the right middle lobe. Follow-up is recommended.   Non-contrast chest CT at 6-12 months is recommended.   I ordered CT. Due 09/2019.  He needs to call the office to ensure this is scheduled. He may call in November  2) CT Ab also showed:  Slightly complex cystic structure arising from the upper pole of the right kidney. Follow-up with a nonemergent outpatient ultrasound is recommended for further evaluation of this finding.  I have ordered renal US. Advise patient to call us if this is not scheduled in the next couple of weeks.   3) CT Ab also showed gallstones. Advise patient to have an appt if he has any pain with meals, N, V, or right upper abdominal pain as this could mean the gallstones have now become problematic.

## 2019-05-09 DIAGNOSIS — H43391 Other vitreous opacities, right eye: Secondary | ICD-10-CM | POA: Diagnosis not present

## 2019-05-09 NOTE — Telephone Encounter (Signed)
LM asking patient to call back

## 2019-05-09 NOTE — Telephone Encounter (Signed)
I tried to call patient back a second time, but was unable to reach.

## 2019-05-12 ENCOUNTER — Other Ambulatory Visit: Payer: Self-pay

## 2019-05-12 ENCOUNTER — Telehealth (INDEPENDENT_AMBULATORY_CARE_PROVIDER_SITE_OTHER): Payer: BC Managed Care – PPO | Admitting: Family

## 2019-05-12 ENCOUNTER — Encounter: Payer: Self-pay | Admitting: Family

## 2019-05-12 DIAGNOSIS — R600 Localized edema: Secondary | ICD-10-CM

## 2019-05-12 NOTE — Patient Instructions (Addendum)
  Let's be aggressive with ace wrap this next week and see if this helps with swelling. CONTINUE icing. Let me know of any concerns of infection.   In reviewing images from ED,   1) a pulmonary nodule was seen on CT chest.  7 mm pulmonary nodule in the right middle lobe. Follow-up is recommended.   Non-contrast chest CT at 6-12 months is recommended.   I ordered CT. Due 09/2019.   You will  need to call the office to ensure this is scheduled. You may call in November to schedule.   2) CT Ab also showed:  Slightly complex cystic structure arising from the upper pole of the right kidney. Follow-up with a nonemergent outpatient ultrasound is recommended for further evaluation of this finding.  I have ordered renal US. We will call you to schedule this as well.   If you not having any symptoms in regards to gallbladder which include right upper quadrant pain, nausea, vomiting, we wont worry regarding dedicated images however if you begin to have any concerns, please let us know we can further evaluate.

## 2019-05-12 NOTE — Telephone Encounter (Signed)
Patient has appointment today 05/12/19. I have faxed filled out paperwork to [facility].
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
## 2019-05-15 NOTE — Assessment & Plan Note (Signed)
Exam limited by telemedicine. No swelling in calf. Edema appears limited to ankle, dorsal aspect foot with increased activity. Improves with rest. Advised compression ( ACE wrap) and to be more gradual with increased activity. He declines XR left ankle or repeat US. Given return to work note in one week, which is contingent on patient feeling well and swelling able to be controlled so he can walk at work. He will let me know. Discussed seeing orthopedics if no improvement with ACE wrap. He verbalized understanding.

## 2019-05-17 ENCOUNTER — Ambulatory Visit
Admission: RE | Admit: 2019-05-17 | Discharge: 2019-05-17 | Disposition: A | Discharge status: Home / Self Care | Payer: BC Managed Care – PPO | Source: Ambulatory Visit | Attending: Family | Admitting: Family

## 2019-05-17 ENCOUNTER — Other Ambulatory Visit: Payer: Self-pay

## 2019-05-17 DIAGNOSIS — N281 Cyst of kidney, acquired: Secondary | ICD-10-CM | POA: Diagnosis not present

## 2019-05-26 ENCOUNTER — Telehealth: Payer: Self-pay | Admitting: Family

## 2019-05-26 DIAGNOSIS — R600 Localized edema: Secondary | ICD-10-CM

## 2019-05-26 NOTE — Telephone Encounter (Signed)
Placed letter and wraps up front patient aware.

## 2019-05-26 NOTE — Telephone Encounter (Signed)
Patient has kept leg wrapped the wrapping helps and has started to improve, patient too k an additional week ok to write.

## 2019-05-26 NOTE — Telephone Encounter (Signed)
Call pt  He is fine to go back to work if he feels able and swelling is controlled, and certainly improved from prior.   Ace wraps and note up front  Thank you!

## 2019-05-26 NOTE — Telephone Encounter (Signed)
Pt needs his work letter changed to return to work on 05/29/19.  Pt is also asking ig her can get 3 additional ace wraps when he picks up letter  Please call when ready  [phone]

## 2019-05-29 NOTE — Telephone Encounter (Signed)
Please address

## 2019-05-29 NOTE — Telephone Encounter (Signed)
Patient is calling to request another to be written to return back to work today 05/29/2019. He needs the date changed from 05/22/2019 to 05/29/2019.    Patient is requesting no restrictions  Patient is requesting to be called when the note is ready. Cb- [phone]

## 2019-05-30 NOTE — Telephone Encounter (Signed)
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

## 2019-05-31 ENCOUNTER — Telehealth: Payer: Self-pay | Admitting: Family

## 2019-05-31 NOTE — Telephone Encounter (Signed)
See paperwork from the [place] Please mail OV notes

## 2019-05-31 NOTE — Telephone Encounter (Signed)
Printed up OV summary and faxed over to [facility] with packet  forms

## 2019-06-02 ENCOUNTER — Encounter: Payer: Self-pay | Admitting: Family

## 2019-06-02 DIAGNOSIS — J3489 Other specified disorders of nose and nasal sinuses: Secondary | ICD-10-CM | POA: Diagnosis not present

## 2019-06-02 NOTE — Telephone Encounter (Signed)
Judson Roch, do you have? I dont have anything in my folder

## 2019-06-02 NOTE — Telephone Encounter (Signed)
I called patient to let him know that we had not received paperwork from [facility]. I checked my box & folders, but nothing was there for patient. He call & have then refax to [place].

## 2019-06-05 ENCOUNTER — Other Ambulatory Visit: Payer: Self-pay | Admitting: Family

## 2019-06-05 DIAGNOSIS — S022XXS Fracture of nasal bones, sequela: Secondary | ICD-10-CM

## 2019-06-05 DIAGNOSIS — S0231XS Fracture of orbital floor, right side, sequela: Secondary | ICD-10-CM

## 2019-06-05 DIAGNOSIS — S2242XS Multiple fractures of ribs, left side, sequela: Secondary | ICD-10-CM

## 2019-06-05 MED ORDER — TRAMADOL HCL 50 MG PO TABS: 50.0000 mg | ORAL_TABLET | Freq: Two times a day (BID) | ORAL | 1 refills | Status: DC | PRN

## 2019-06-05 NOTE — Progress Notes (Signed)
I looked up patient on [place] Controlled Substances Reporting System and saw no activity that raised concern of inappropriate use.   

## 2019-06-18 ENCOUNTER — Other Ambulatory Visit: Payer: Self-pay | Admitting: Family

## 2019-06-18 DIAGNOSIS — G8929 Other chronic pain: Secondary | ICD-10-CM

## 2019-07-14 ENCOUNTER — Other Ambulatory Visit: Payer: Self-pay | Admitting: Family

## 2019-07-14 DIAGNOSIS — I10 Essential (primary) hypertension: Secondary | ICD-10-CM

## 2019-07-16 ENCOUNTER — Other Ambulatory Visit: Payer: Self-pay | Admitting: Family

## 2019-07-16 DIAGNOSIS — I10 Essential (primary) hypertension: Secondary | ICD-10-CM

## 2019-07-17 ENCOUNTER — Other Ambulatory Visit: Payer: Self-pay | Admitting: Family

## 2019-07-27 ENCOUNTER — Encounter: Payer: Self-pay | Admitting: Family Medicine

## 2019-07-27 NOTE — Telephone Encounter (Signed)
Please set up a virtual for patient.  Even though tramadol is not definitely as effective as other narcotic and controlled medications, is still a controlled medicine and the goal is to use as sparingly as possible.  LG

## 2019-07-31 ENCOUNTER — Other Ambulatory Visit: Payer: Self-pay

## 2019-07-31 ENCOUNTER — Ambulatory Visit (INDEPENDENT_AMBULATORY_CARE_PROVIDER_SITE_OTHER): Payer: BC Managed Care – PPO | Admitting: Family Medicine

## 2019-07-31 DIAGNOSIS — S2242XS Multiple fractures of ribs, left side, sequela: Secondary | ICD-10-CM

## 2019-07-31 DIAGNOSIS — S0231XS Fracture of orbital floor, right side, sequela: Secondary | ICD-10-CM

## 2019-07-31 DIAGNOSIS — G8921 Chronic pain due to trauma: Secondary | ICD-10-CM

## 2019-07-31 DIAGNOSIS — S022XXA Fracture of nasal bones, initial encounter for closed fracture: Secondary | ICD-10-CM | POA: Insufficient documentation

## 2019-07-31 DIAGNOSIS — S022XXS Fracture of nasal bones, sequela: Secondary | ICD-10-CM | POA: Diagnosis not present

## 2019-07-31 DIAGNOSIS — S2242XA Multiple fractures of ribs, left side, initial encounter for closed fracture: Secondary | ICD-10-CM | POA: Insufficient documentation

## 2019-07-31 DIAGNOSIS — S0231XA Fracture of orbital floor, right side, initial encounter for closed fracture: Secondary | ICD-10-CM | POA: Insufficient documentation

## 2019-07-31 MED ORDER — TRAMADOL HCL 50 MG PO TABS: 50.0000 mg | ORAL_TABLET | Freq: Three times a day (TID) | ORAL | 1 refills | Status: DC | PRN
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
## 2019-09-15 ENCOUNTER — Other Ambulatory Visit: Payer: Self-pay | Admitting: Family

## 2019-09-15 DIAGNOSIS — G8929 Other chronic pain: Secondary | ICD-10-CM

## 2019-09-15 NOTE — Telephone Encounter (Signed)
Last OV 07/31/19 last refill 06/20/19 for 30 with 2 refills

## 2019-09-20 ENCOUNTER — Ambulatory Visit: Payer: BC Managed Care – PPO | Attending: Family

## 2019-09-29 ENCOUNTER — Other Ambulatory Visit: Payer: Self-pay | Admitting: Family

## 2019-09-29 ENCOUNTER — Encounter: Payer: Self-pay | Admitting: Family

## 2019-09-29 DIAGNOSIS — S022XXS Fracture of nasal bones, sequela: Secondary | ICD-10-CM

## 2019-09-29 DIAGNOSIS — S2242XS Multiple fractures of ribs, left side, sequela: Secondary | ICD-10-CM

## 2019-09-29 DIAGNOSIS — S0231XS Fracture of orbital floor, right side, sequela: Secondary | ICD-10-CM

## 2019-09-29 MED ORDER — SILDENAFIL CITRATE 100 MG PO TABS: 100.0000 mg | ORAL_TABLET | ORAL | 0 refills | Status: DC | PRN

## 2019-09-29 MED ORDER — TRAMADOL HCL 50 MG PO TABS: 50.0000 mg | ORAL_TABLET | Freq: Three times a day (TID) | ORAL | 1 refills | Status: DC | PRN

## 2019-09-29 NOTE — Progress Notes (Signed)
I looked up patient on [place] Controlled Substances Reporting System and saw no activity that raised concern of inappropriate use.   

## 2019-10-09 ENCOUNTER — Telehealth: Payer: Self-pay | Admitting: Family

## 2019-10-09 NOTE — Telephone Encounter (Signed)
Call pt ( dont send mychart as he is not answering.Jason Hooper)  We waned to follow-up and ensure the patient received Melissa's My chart note about repeat CT Chest. Please strongly advise him to have done due to right lobe 78mm nodule.

## 2019-10-10 NOTE — Telephone Encounter (Signed)
LMTCB so we could try to get him rescheduled for repeat CT.

## 2019-10-23 NOTE — Telephone Encounter (Signed)
Circle back If no answer, mail letter to the effect that   we advise calling us to re-schedule repeat CT chest for lung nodule surveillance.

## 2019-10-24 NOTE — Telephone Encounter (Signed)
I called and LM to call back. I have also mailed letter.

## 2019-11-21 ENCOUNTER — Telehealth: Payer: Self-pay | Admitting: Family

## 2019-11-21 NOTE — Telephone Encounter (Signed)
Pt called to inquire about CT scan on lung.

## 2019-11-22 NOTE — Telephone Encounter (Signed)
Jason Hooper, call pt, he is due for f/u with me and tell him that are working on ct chest; if he doesn't hear from [place] in regards to this in a week, advise him to call us back  Jason Hooper/ Jason Hooper, can we schedule ct chest wo contrast for patient? It is sitting there under imaging

## 2019-11-22 NOTE — Telephone Encounter (Signed)
Pt aware that Ct was ordered & f/u scheduled.  He requests that this be scheduled on [place] if all possible?!

## 2019-11-22 NOTE — Telephone Encounter (Signed)
Is this something that you want to go ahead & reorder for patient? I left him a message to please call back to schedule f/u appointment.

## 2019-11-24 NOTE — Telephone Encounter (Signed)
He has been rescheduled for Feb. 16. Mychart message sent to him with appointment information (he is active on mychart).  Thanks, [employer]

## 2019-11-24 NOTE — Telephone Encounter (Signed)
noted 

## 2019-11-24 NOTE — Telephone Encounter (Signed)
I also called patient LM that CT had be reordered.

## 2019-11-28 ENCOUNTER — Ambulatory Visit: Payer: BC Managed Care – PPO | Attending: Family

## 2019-12-06 ENCOUNTER — Other Ambulatory Visit: Payer: Self-pay | Admitting: Family

## 2019-12-06 DIAGNOSIS — S0231XS Fracture of orbital floor, right side, sequela: Secondary | ICD-10-CM

## 2019-12-06 DIAGNOSIS — S2242XS Multiple fractures of ribs, left side, sequela: Secondary | ICD-10-CM

## 2019-12-06 DIAGNOSIS — S022XXS Fracture of nasal bones, sequela: Secondary | ICD-10-CM

## 2019-12-06 DIAGNOSIS — A630 Anogenital (venereal) warts: Secondary | ICD-10-CM | POA: Diagnosis not present

## 2019-12-06 NOTE — Telephone Encounter (Signed)
Refill request for tramadol, last seen & last filled 09-29-19.  Please advise.

## 2019-12-06 NOTE — Telephone Encounter (Signed)
Upcoming appointment I looked up patient on [place] Controlled Substances Reporting System and saw no activity that raised concern of inappropriate use.

## 2019-12-22 ENCOUNTER — Ambulatory Visit (INDEPENDENT_AMBULATORY_CARE_PROVIDER_SITE_OTHER): Payer: BC Managed Care – PPO | Admitting: Family

## 2019-12-22 ENCOUNTER — Encounter: Payer: Self-pay | Admitting: Family

## 2019-12-22 ENCOUNTER — Other Ambulatory Visit: Payer: Self-pay

## 2019-12-22 VITALS — BP 118/78 | HR 74 | Temp 97.9°F | Ht 72.5 in | Wt 203.4 lb

## 2019-12-22 DIAGNOSIS — R911 Solitary pulmonary nodule: Secondary | ICD-10-CM | POA: Diagnosis not present

## 2019-12-22 DIAGNOSIS — M545 Low back pain: Secondary | ICD-10-CM | POA: Diagnosis not present

## 2019-12-22 DIAGNOSIS — B977 Papillomavirus as the cause of diseases classified elsewhere: Secondary | ICD-10-CM | POA: Insufficient documentation

## 2019-12-22 DIAGNOSIS — R198 Other specified symptoms and signs involving the digestive system and abdomen: Secondary | ICD-10-CM | POA: Diagnosis not present

## 2019-12-22 DIAGNOSIS — Z23 Encounter for immunization: Secondary | ICD-10-CM | POA: Diagnosis not present

## 2019-12-22 DIAGNOSIS — G8929 Other chronic pain: Secondary | ICD-10-CM

## 2019-12-22 DIAGNOSIS — I10 Essential (primary) hypertension: Secondary | ICD-10-CM | POA: Diagnosis not present

## 2019-12-22 MED ORDER — AMLODIPINE BESYLATE 5 MG PO TABS: 5.0000 mg | ORAL_TABLET | Freq: Every day | ORAL | 3 refills | Status: DC

## 2019-12-22 NOTE — Progress Notes (Signed)
Subjective:    [name], male    DOB: Nov 23, 1965, 54 y.o.   MRN: [id number]  CC: Jason Hooper is a 54 y.o. male who presents today for follow up.   HPI: Feels well today today, no new concerns.  Low back pain- takes tramadol one every 8 hours.   HTN-noticed bilateral lower extremity swelling on amlodipine; has been taking every other day to control blood pressure.  No chest pain shortness of breath.  Anxiety-well-controlled propranolol.  No SI/HI.  Complains of continued feeling of needing to have BM. Normal formed bowel movements however feels like this constantly. Today he has had 3 BMs and feels like he need to 'clinch' his  Bottom.  No fecal incontinence. He is concerned about his prostate.  Incomplete colonoscopy 2019.  No constipation. Endorses h/o hemorrhoids and from time to time will see rectal bleeding , BRB on the toilet paper, not in stool  No melana stool , diarrhea, weight loss.  NO prostate pain. NO abnormal penile discharge.  Decreased urine stream.  Has seen Jason Jason Hooper , recommend HPV ( Guardisil 9)  Vaccine regardless of age due to h/o HPV.   He would also like Shingrex.     HISTORY:  Past Medical History:  Diagnosis Date  . Alcohol abuse   . Allergy   . Bipolar disorder ([place])   . GERD (gastroesophageal reflux disease)   . Kidney stones 2014   Past Surgical History:  Procedure Laterality Date  . COLONOSCOPY WITH PROPOFOL N/A 01/24/2018   Procedure: COLONOSCOPY WITH PROPOFOL;  Surgeon: Jason Bellows, MD;  Location: [facility] ENDOSCOPY;  Service: Gastroenterology;  Laterality: N/A;  . ESOPHAGEAL DILATION    . ESOPHAGOGASTRODUODENOSCOPY (EGD) WITH PROPOFOL N/A 01/24/2018   Procedure: ESOPHAGOGASTRODUODENOSCOPY (EGD) WITH PROPOFOL;  Surgeon: Jason Bellows, MD;  Location: [facility] ENDOSCOPY;  Service: Gastroenterology;  Laterality: N/A;  . JOINT REPLACEMENT Right 07/16/2017   R great toe  . TONSILECTOMY/ADENOIDECTOMY WITH MYRINGOTOMY     Family  History  Adopted: Yes  Family history unknown: Yes    Allergies: Patient has no known allergies. Current Outpatient Medications on File Prior to Visit  Medication Sig Dispense Refill  . cyclobenzaprine (FLEXERIL) 10 MG tablet TAKE 1 TABLET BY MOUTH AT BEDTIME AS NEEDED FOR MUSCLE SPASMS 30 tablet 2  . montelukast (SINGULAIR) 10 MG tablet TAKE 1 TABLET BY MOUTH EVERYDAY AT BEDTIME 90 tablet 3  . omeprazole (PRILOSEC) 20 MG capsule Take 1 capsule (20 mg total) by mouth daily. 90 capsule 1  . propranolol (INDERAL) 40 MG tablet TAKE 1 TABLET BY MOUTH TWICE A DAY 180 tablet 1  . sildenafil (VIAGRA) 100 MG tablet Take 1 tablet (100 mg total) by mouth as needed for erectile dysfunction. 5 tablet 0  . traMADol (ULTRAM) 50 MG tablet TAKE 1 TABLET (50 MG TOTAL) BY MOUTH EVERY 8 (EIGHT) HOURS AS NEEDED FOR MODERATE PAIN. 90 tablet 1  . valACYclovir (VALTREX) 500 MG tablet Take 1 tablet (500 mg total) by mouth 2 (two) times daily. 30 tablet 1   No current facility-administered medications on file prior to visit.    Social History   Tobacco Use  . Smoking status: Never Smoker  . Smokeless tobacco: Former Systems developer    Types: Snuff  Substance Use Topics  . Alcohol use: Yes    Alcohol/week: 1.0 - 2.0 standard drinks    Types: 1 - 2 Cans of beer per week    Comment: occasional  . Drug use:  No    Review of Systems  Constitutional: Negative for chills and fever.  Respiratory: Negative for cough.   Cardiovascular: Negative for chest pain and palpitations.  Gastrointestinal: Negative for nausea and vomiting.      Objective:    BP 118/78   Pulse 74   Temp 97.9 F (36.6 C)   Ht 6' 0.5" (1.842 m)   Wt 203 lb 6.4 oz (92.3 kg)   SpO2 99%   BMI 27.21 kg/m  BP Readings from Last 3 Encounters:  12/22/19 118/78  04/04/19 122/84  03/25/19 (!) 141/86   Wt Readings from Last 3 Encounters:  12/22/19 203 lb 6.4 oz (92.3 kg)  04/04/19 200 lb 9.6 oz (91 kg)  10/21/18 205 lb 12.8 oz (93.4 kg)     Physical Exam Vitals reviewed.  Constitutional:      Appearance: He is well-developed.  Cardiovascular:     Rate and Rhythm: Regular rhythm.     Heart sounds: Normal heart sounds.  Pulmonary:     Effort: Pulmonary effort is normal. No respiratory distress.     Breath sounds: Normal breath sounds. No wheezing, rhonchi or rales.  Musculoskeletal:     Right lower leg: No edema.     Left lower leg: No edema.  Skin:    General: Skin is warm and dry.  Neurological:     Mental Status: He is alert.  Psychiatric:        Speech: Speech normal.        Behavior: Behavior normal.        Assessment & Plan:   Problem List Items Addressed This Visit      Cardiovascular and Mediastinum   HTN (hypertension) - Primary    Controlled, continue amlodipine 5 mg to take daily.  Patient will let me know blood pressure remains well controlled.      Relevant Medications   amLODipine (NORVASC) 5 MG tablet   Other Relevant Orders   Comprehensive metabolic panel     Other   Chronic left-sided low back pain without sciatica    Chronic, unchanged.  Doing well on regimen of tramadol.  Urine drug screen today.  Follow-up in 3 months      Relevant Orders   Pain Mgmt, Profile 8 w/Conf, U   Pulmonary nodule, right    Patient missed these appointments. Note to Jason Hooper to reschedule and patient will let us know if he does not hear from her in one week.      Tenesmus    Acute on chronic.  Concern that tenesmus related to obstruction of prostate.  Patient did not complet colonoscopy, which I have advised him to call Jason Hooper for this. He declines prostate exam today. Pending psa.       Relevant Orders   Ambulatory referral to Urology   Fecal occult blood, imunochemical   PSA    Other Visit Diagnoses    Need for shingles vaccine       Relevant Orders   Varicella-zoster vaccine IM (Shingrix) (Completed)       I have discontinued Jason Hooper. Jason "Michael"'s amLODipine. I am also having him  start on amLODipine. Additionally, I am having him maintain his valACYclovir, omeprazole, montelukast, propranolol, cyclobenzaprine, sildenafil, and traMADol.   Meds ordered this encounter  Medications  . amLODipine (NORVASC) 5 MG tablet    Sig: Take 1 tablet (5 mg total) by mouth daily.    Dispense:  90 tablet    Refill:  3  Order Specific Question:   Supervising Provider    Answer:   Crecencio Mc [[id number]]    Return precautions given.   Risks, benefits, and alternatives of the medications and treatment plan prescribed today were discussed, and patient expressed understanding.   Education regarding symptom management and diagnosis given to patient on AVS.  Continue to follow with Burnard Hawthorne, FNP for routine health maintenance.   [name] and I agreed with plan.   [name], FNP

## 2019-12-22 NOTE — Assessment & Plan Note (Signed)
Controlled, continue amlodipine 5 mg to take daily.  Patient will let me know blood pressure remains well controlled.

## 2019-12-22 NOTE — Assessment & Plan Note (Signed)
Chronic, unchanged.  Doing well on regimen of tramadol.  Urine drug screen today.  Follow-up in 3 months

## 2019-12-22 NOTE — Assessment & Plan Note (Signed)
Acute on chronic.  Concern that tenesmus related to obstruction of prostate.  Patient did not complet colonoscopy, which I have advised him to call [name] for this. He declines prostate exam today. Pending psa.

## 2019-12-22 NOTE — Assessment & Plan Note (Signed)
Patient missed these appointments. Note to [name] to reschedule and patient will let us know if he does not hear from her in one week.

## 2019-12-22 NOTE — Patient Instructions (Addendum)
Call back to GI, Dr. Vicente Males, and reschedule colonoscopy for completeness. Please complete stool cards. Please ensure that she did call as well from urology with an appointment scheduled.  Let us know if you do not. We will have your CT chest scheduled, if you not hear from this as well, give [place] a call in 1 week  You may take amlodipine 5 mg every day.  We will  Get back you in regards to the guardisil 9 vaccine.  Do not get the Covid vaccine within 2 weeks before or after another vaccine per the [facility]  Stay safe!

## 2019-12-26 NOTE — Progress Notes (Signed)
Yes , there was another male patient of Dr. Tharon Aquas

## 2019-12-27 ENCOUNTER — Other Ambulatory Visit (INDEPENDENT_AMBULATORY_CARE_PROVIDER_SITE_OTHER): Payer: BC Managed Care – PPO

## 2019-12-27 ENCOUNTER — Other Ambulatory Visit: Payer: Self-pay

## 2019-12-27 ENCOUNTER — Telehealth: Payer: Self-pay | Admitting: Family

## 2019-12-27 DIAGNOSIS — R198 Other specified symptoms and signs involving the digestive system and abdomen: Secondary | ICD-10-CM | POA: Diagnosis not present

## 2019-12-27 DIAGNOSIS — M545 Low back pain, unspecified: Secondary | ICD-10-CM

## 2019-12-27 DIAGNOSIS — I10 Essential (primary) hypertension: Secondary | ICD-10-CM

## 2019-12-27 DIAGNOSIS — G8929 Other chronic pain: Secondary | ICD-10-CM

## 2019-12-27 NOTE — Telephone Encounter (Signed)
Call patient I have been reviewing the article in regards to HPV vaccine.

## 2019-12-28 LAB — COMPREHENSIVE METABOLIC PANEL WITH GFR
AG Ratio: 2.1 (calc) (ref 1.0–2.5)
ALT: 26 U/L (ref 9–46)
AST: 20 U/L (ref 10–35)
Albumin: 4.5 g/dL (ref 3.6–5.1)
Alkaline phosphatase (APISO): 64 U/L (ref 35–144)
BUN: 18 mg/dL (ref 7–25)
CO2: 29 mmol/L (ref 20–32)
Calcium: 10 mg/dL (ref 8.6–10.3)
Chloride: 102 mmol/L (ref 98–110)
Creat: 1.04 mg/dL (ref 0.70–1.33)
Globulin: 2.1 g/dL (ref 1.9–3.7)
Glucose, Bld: 112 mg/dL — ABNORMAL HIGH (ref 65–99)
Potassium: 4.4 mmol/L (ref 3.5–5.3)
Sodium: 141 mmol/L (ref 135–146)
Total Bilirubin: 0.5 mg/dL (ref 0.2–1.2)
Total Protein: 6.6 g/dL (ref 6.1–8.1)

## 2019-12-28 LAB — PAIN MGMT, PROFILE 8 W/CONF, U
6 Acetylmorphine: NEGATIVE ng/mL
Alcohol Metabolites: NEGATIVE ng/mL (ref <500)
Amphetamines: NEGATIVE ng/mL
Benzodiazepines: NEGATIVE ng/mL
Buprenorphine, Urine: NEGATIVE ng/mL
Cocaine Metabolite: NEGATIVE ng/mL
Creatinine: 136.7 mg/dL
MDMA: NEGATIVE ng/mL
Marijuana Metabolite: NEGATIVE ng/mL
Opiates: NEGATIVE ng/mL
Oxidant: NEGATIVE ug/mL
Oxycodone: NEGATIVE ng/mL
pH: 6 (ref 4.5–9.0)

## 2019-12-28 LAB — PSA: PSA: 0.4 ng/mL (ref <=4.0)

## 2020-01-01 ENCOUNTER — Other Ambulatory Visit: Payer: Self-pay | Admitting: Family

## 2020-01-01 DIAGNOSIS — R911 Solitary pulmonary nodule: Secondary | ICD-10-CM

## 2020-01-01 NOTE — Telephone Encounter (Signed)
Call pt  I have discussed his desire to get the hpv vaccine with Dr Derrel Nip and wanted to circle back for more information as to why he would like vaccine.    Was he diagnosed with HPV by dr Nehemiah Massed? Was he under the impression that it prevent further HPV , or prevent all together?  In looking through articles, the HPV vaccine is approved for adults 27-45 however benefit of people older is much less studied.

## 2020-01-04 NOTE — Telephone Encounter (Signed)
Pt has HPV and was diagnosed by DR Nehemiah Massed. He is under the impression that this would prevent HPV.  Pt informed that this is less studied in adults his age and verbalized understanding.   Please advise

## 2020-01-09 NOTE — Telephone Encounter (Signed)
LMTCB

## 2020-01-09 NOTE — Telephone Encounter (Signed)
Call pt  Unfortunately I do not have the answer to this.  I would advise that he speak with Dr. Nehemiah Massed about this vaccine.  If he currently has HPV I am not exactly sure what benefit the vaccine would have, as  it is designed to prevent HPV.

## 2020-01-11 ENCOUNTER — Ambulatory Visit: Payer: BC Managed Care – PPO

## 2020-01-15 NOTE — Telephone Encounter (Signed)
Patient stated that Dr. Nehemiah Massed stated that it would not hurt to get vaccine. The vaccine was there to help prevent certain cancers that HPV can cause. He said that Dr. Nehemiah Massed did not have the vaccine in their office.

## 2020-01-16 NOTE — Telephone Encounter (Signed)
Dr Nehemiah Massed,  [facility] you are well. I wanted to loop you in to discussion regard Guardasil 9 vaccine in our clinic for this mutual patient.  To be honest ,this is the first in the this age group > 55 yo, in which I have been asked this question. I thought once a patient had HPV,the benefit of vaccination may not be there, however I couldn't find much literature with a quick search here.   Is this vaccine shown to reduce progression of HPV? Do you recommend that I give it to this patient?  Appreciate your advice Joycelyn Schmid, NP

## 2020-01-16 NOTE — Telephone Encounter (Signed)
Yes. I recommend the Gardisil 9 HPV vaccine be given to this pt. Although it is anectdotal, there is evidence that giving this vaccine may help reduce, clear or decrease progression of HPV in already infected patients. Because there is little to no risk, it may be prudent to give it a try. I have had good results in several patients. Thanks

## 2020-01-17 ENCOUNTER — Other Ambulatory Visit: Payer: Self-pay

## 2020-01-17 ENCOUNTER — Ambulatory Visit
Admission: RE | Admit: 2020-01-17 | Discharge: 2020-01-17 | Disposition: A | Discharge status: Home / Self Care | Payer: BC Managed Care – PPO | Source: Ambulatory Visit | Attending: Family | Admitting: Family

## 2020-01-17 DIAGNOSIS — R911 Solitary pulmonary nodule: Secondary | ICD-10-CM | POA: Diagnosis not present

## 2020-01-17 NOTE — Telephone Encounter (Signed)
[name],   Please call pt and let him know that I rec'ed nice note from Dr Nehemiah Massed in regards to [facility] 9 vaccine. Please sch for patient  [name] as thought you may find interesting :)

## 2020-01-17 NOTE — Telephone Encounter (Signed)
I have scheduled patient for Gardisil 9 vaccine 4/21.

## 2020-01-21 ENCOUNTER — Other Ambulatory Visit: Payer: Self-pay | Admitting: Family

## 2020-01-21 DIAGNOSIS — I10 Essential (primary) hypertension: Secondary | ICD-10-CM

## 2020-01-22 ENCOUNTER — Other Ambulatory Visit: Payer: Self-pay | Admitting: Family

## 2020-01-22 DIAGNOSIS — R911 Solitary pulmonary nodule: Secondary | ICD-10-CM

## 2020-01-26 ENCOUNTER — Encounter: Payer: Self-pay | Admitting: Urology

## 2020-01-26 ENCOUNTER — Other Ambulatory Visit: Payer: Self-pay

## 2020-01-26 ENCOUNTER — Ambulatory Visit (INDEPENDENT_AMBULATORY_CARE_PROVIDER_SITE_OTHER): Payer: BC Managed Care – PPO | Admitting: Urology

## 2020-01-26 VITALS — BP 126/80 | HR 65 | Ht 73.0 in | Wt 205.1 lb

## 2020-01-26 DIAGNOSIS — N5319 Other ejaculatory dysfunction: Secondary | ICD-10-CM

## 2020-01-26 DIAGNOSIS — M6289 Other specified disorders of muscle: Secondary | ICD-10-CM

## 2020-01-26 DIAGNOSIS — R3912 Poor urinary stream: Secondary | ICD-10-CM | POA: Diagnosis not present

## 2020-01-26 DIAGNOSIS — N343 Urethral syndrome, unspecified: Secondary | ICD-10-CM

## 2020-01-26 DIAGNOSIS — N400 Enlarged prostate without lower urinary tract symptoms: Secondary | ICD-10-CM | POA: Diagnosis not present

## 2020-01-26 LAB — URINALYSIS, COMPLETE
Bilirubin, UA: NEGATIVE
Glucose, UA: NEGATIVE
Ketones, UA: NEGATIVE
Leukocytes,UA: NEGATIVE
Nitrite, UA: NEGATIVE
Protein,UA: NEGATIVE
RBC, UA: NEGATIVE
Specific Gravity, UA: 1.025 (ref 1.005–1.030)
Urobilinogen, Ur: 0.2 mg/dL (ref 0.2–1.0)
pH, UA: 5.5 (ref 5.0–7.5)

## 2020-01-26 LAB — MICROSCOPIC EXAMINATION
Bacteria, UA: NONE SEEN
RBC, Urine: NONE SEEN /hpf (ref 0–2)

## 2020-01-26 LAB — BLADDER SCAN AMB NON-IMAGING

## 2020-01-26 NOTE — Patient Instructions (Signed)

## 2020-01-26 NOTE — Progress Notes (Signed)
01/26/2020 12:00 PM   Jason Hooper 1966/03/23 [id number]  Referring provider: Burnard Hawthorne, FNP [address] [place],  [place] [address]  Chief Complaint  Patient presents with  . Benign Prostatic Hypertrophy    HPI:  Jason Hooper was sent over to determine if his prostate could be contributing to tenesmus. He underwent CT of the A/P June 2020 after a "trauma" and his prostate was not enlarged. His PSA was 0.4 on 12/27/2019. A renal US was benign Aug 2020 with bilateral renal cysts (also seen on the CT). He's noticed he feels like he needs to have a BM every time he sits down. As far as GU, he has some weak stream and decreased ejaculation. No alpha blockers. No gross hematuria. AUASS = 10. Doesn't recall urethral trauma or STI. SHIM = 17.   PMH: Past Medical History:  Diagnosis Date  . Alcohol abuse   . Allergy   . Bipolar disorder ([place])   . GERD (gastroesophageal reflux disease)   . Kidney stones 2014    Surgical History: Past Surgical History:  Procedure Laterality Date  . COLONOSCOPY WITH PROPOFOL N/A 01/24/2018   Procedure: COLONOSCOPY WITH PROPOFOL;  Surgeon: [name], MD;  Location: [facility] ENDOSCOPY;  Service: Gastroenterology;  Laterality: N/A;  . ESOPHAGEAL DILATION    . ESOPHAGOGASTRODUODENOSCOPY (EGD) WITH PROPOFOL N/A 01/24/2018   Procedure: ESOPHAGOGASTRODUODENOSCOPY (EGD) WITH PROPOFOL;  Surgeon: [name], MD;  Location: [facility] ENDOSCOPY;  Service: Gastroenterology;  Laterality: N/A;  . JOINT REPLACEMENT Right 07/16/2017   R great toe  . TONSILECTOMY/ADENOIDECTOMY WITH MYRINGOTOMY      Home Medications:  Allergies as of 01/26/2020   No Known Allergies     Medication List       Accurate as of January 26, 2020 12:00 PM. If you have any questions, ask your nurse or doctor.        amLODipine 5 MG tablet Commonly known as: NORVASC Take 1 tablet (5 mg total) by mouth daily.   cyclobenzaprine 10 MG tablet Commonly known as: FLEXERIL TAKE 1  TABLET BY MOUTH AT BEDTIME AS NEEDED FOR MUSCLE SPASMS   imiquimod 5 % cream Commonly known as: ALDARA   montelukast 10 MG tablet Commonly known as: SINGULAIR TAKE 1 TABLET BY MOUTH EVERYDAY AT BEDTIME   omeprazole 20 MG capsule Commonly known as: PRILOSEC Take 1 capsule (20 mg total) by mouth daily.   propranolol 40 MG tablet Commonly known as: INDERAL TAKE 1 TABLET BY MOUTH TWICE A DAY   sildenafil 100 MG tablet Commonly known as: VIAGRA Take 1 tablet (100 mg total) by mouth as needed for erectile dysfunction.   traMADol 50 MG tablet Commonly known as: ULTRAM TAKE 1 TABLET (50 MG TOTAL) BY MOUTH EVERY 8 (EIGHT) HOURS AS NEEDED FOR MODERATE PAIN.   valACYclovir 500 MG tablet Commonly known as: Valtrex Take 1 tablet (500 mg total) by mouth 2 (two) times daily.       Allergies: No Known Allergies  Family History: Family History  Adopted: Yes  Family history unknown: Yes    Social History:  reports that he has never smoked. He quit smokeless tobacco use about 5 years ago.  His smokeless tobacco use included snuff. He reports current alcohol use of about 1.0 - 2.0 standard drinks of alcohol per week. He reports that he does not use drugs.   Physical Exam: BP 126/80 (BP Location: Left Arm, Patient Position: Sitting, Cuff Size: Normal)   Pulse 65   Ht  6\' 1"  (1.854 m)   Wt 205 lb 1.6 oz (93 kg)   BMI 27.06 kg/m   Constitutional:  Alert and oriented, No acute distress. HEENT: [place] AT, moist mucus membranes.  Trachea midline, no masses. Cardiovascular: No clubbing, cyanosis, or edema. Respiratory: Normal respiratory effort, no increased work of breathing. GI: Abdomen is soft, nontender, nondistended, no abdominal masses GU: No CVA tenderness; DRE prostate benign, non-tender, not enlarged. Palpation of pelvic floor reproduces symptoms.  Lymph: No cervical or inguinal lymphadenopathy. Skin: No rashes, bruises or suspicious lesions. Neurologic: Grossly intact, no focal  deficits, moving all 4 extremities. Psychiatric: Normal mood and affect.  Laboratory Data: Lab Results  Component Value Date   WBC 9.3 03/25/2019   HGB 13.7 03/25/2019   HCT 39.8 03/25/2019   MCV 91.7 03/25/2019   PLT 241 03/25/2019    Lab Results  Component Value Date   CREATININE 1.04 12/27/2019    Lab Results  Component Value Date   PSA 0.4 12/27/2019   PSA 0.35 02/02/2019   PSA 0.5 05/11/2018    No results found for: TESTOSTERONE  No results found for: HGBA1C  Urinalysis    Component Value Date/Time   COLORURINE YELLOW (A) 03/01/2015 0320   APPEARANCEUR Clear 02/02/2019 1406   LABSPEC 1.013 03/01/2015 0320   LABSPEC 1.024 08/31/2014 2035   PHURINE 5.0 03/01/2015 0320   GLUCOSEU Negative 02/02/2019 1406   GLUCOSEU Negative 08/31/2014 2035   HGBUR NEGATIVE 03/01/2015 0320   BILIRUBINUR Negative 02/02/2019 1406   BILIRUBINUR Negative 08/31/2014 2035   KETONESUR TRACE (A) 03/01/2015 0320   PROTEINUR Negative 02/02/2019 1406   PROTEINUR NEGATIVE 03/01/2015 0320   NITRITE Negative 02/02/2019 1406   NITRITE NEGATIVE 03/01/2015 0320   LEUKOCYTESUR Negative 02/02/2019 1406   LEUKOCYTESUR Negative 08/31/2014 2035    Lab Results  Component Value Date   LABMICR Comment 02/02/2019   BACTERIA RARE (A) 03/01/2015    Pertinent Imaging: CT images and [place] images  No results found for this or any previous visit. No results found for this or any previous visit. No results found for this or any previous visit. No results found for this or any previous visit. Results for orders placed during the hospital encounter of 05/17/19  US Renal   Narrative CLINICAL DATA:  Follow-up cystic structure upper pole right kidney seen on CT  EXAM: RENAL / URINARY TRACT ULTRASOUND COMPLETE  COMPARISON:  03/25/2019  FINDINGS: Right Kidney:  Renal measurements: 10.6 x 5.5 x 5.6 cm = volume: 170 mL. Cysts noted in the upper pole of the right kidney measures 2 x 1.6 x 1.4 cm. No  internal echoes, septations or nodularity. This appears to represent a benign cyst.  Left Kidney:  Renal measurements: 12.4 x 5.1 x 4.9 cm = volume: 161 mL. 2.2 x 2.1 x 1.9 cm exophytic cyst off the lower pole. No hydronephrosis. Normal echotexture.  Bladder:  Appears normal for degree of bladder distention.  IMPRESSION: Bilateral benign appearing renal cysts. No acute findings. No hydronephrosis.   Electronically Signed   By: Rolm Baptise M.D.   On: 05/18/2019 01:22    No results found for this or any previous visit. No results found for this or any previous visit. No results found for this or any previous visit.  Assessment & Plan:    1. Weak stream - discussed the nature r/b of cystoscopy and he proceed.  - Urinalysis, Complete - Bladder Scan (Post Void Residual) in office  2. Ejaculatory disorder -  as above - can eval for sx and asses veru and ejac ducts/prostate.  No follow-ups on file.   3. Pelvic floor tension - prostate not enlarged on exam or CT. I also recommended pelvic floor PT which can help his symptoms and he will consider.   Festus Aloe, MD  [facility] [address] [place], [place] [address] [phone]

## 2020-01-31 ENCOUNTER — Other Ambulatory Visit: Payer: Self-pay | Admitting: Family

## 2020-01-31 ENCOUNTER — Ambulatory Visit: Payer: BC Managed Care – PPO

## 2020-01-31 DIAGNOSIS — S022XXS Fracture of nasal bones, sequela: Secondary | ICD-10-CM

## 2020-01-31 DIAGNOSIS — S2242XS Multiple fractures of ribs, left side, sequela: Secondary | ICD-10-CM

## 2020-01-31 DIAGNOSIS — S0231XS Fracture of orbital floor, right side, sequela: Secondary | ICD-10-CM

## 2020-01-31 NOTE — Telephone Encounter (Signed)
Refill request for tramadol, last seen 12-22-19, last filled 12-06-19.  Please advise.

## 2020-02-06 ENCOUNTER — Other Ambulatory Visit: Payer: Self-pay

## 2020-02-06 ENCOUNTER — Telehealth: Payer: Self-pay | Admitting: Family

## 2020-02-06 DIAGNOSIS — G8929 Other chronic pain: Secondary | ICD-10-CM

## 2020-02-06 MED ORDER — CYCLOBENZAPRINE HCL 10 MG PO TABS: ORAL_TABLET | ORAL | 2 refills | Status: DC

## 2020-02-06 NOTE — Telephone Encounter (Signed)
Pt called back, he would like to wait until July since our schedule is booked up through May and June. He would prefer a Friday afternoon, don't call before 12 noon. If we have a cancellation, will give him a call to be seen sooner  [facility]

## 2020-02-07 NOTE — Telephone Encounter (Signed)
noted 

## 2020-02-08 ENCOUNTER — Other Ambulatory Visit: Payer: Self-pay

## 2020-02-08 ENCOUNTER — Ambulatory Visit (INDEPENDENT_AMBULATORY_CARE_PROVIDER_SITE_OTHER): Payer: BC Managed Care – PPO

## 2020-02-08 DIAGNOSIS — Z23 Encounter for immunization: Secondary | ICD-10-CM

## 2020-02-08 NOTE — Progress Notes (Signed)
Patient presented for Gardasil 9 injection to left deltoid, patient voiced no concerns nor showed any signs of distress during injection.

## 2020-02-09 ENCOUNTER — Other Ambulatory Visit: Payer: Self-pay | Admitting: Urology

## 2020-02-23 ENCOUNTER — Other Ambulatory Visit: Payer: Self-pay | Admitting: Urology

## 2020-03-01 ENCOUNTER — Other Ambulatory Visit: Payer: Self-pay

## 2020-03-01 ENCOUNTER — Encounter: Payer: Self-pay | Admitting: Pulmonary Disease

## 2020-03-01 ENCOUNTER — Ambulatory Visit (INDEPENDENT_AMBULATORY_CARE_PROVIDER_SITE_OTHER): Payer: BC Managed Care – PPO | Admitting: Pulmonary Disease

## 2020-03-01 VITALS — BP 126/74 | HR 86 | Temp 98.2°F | Ht 73.0 in | Wt 206.6 lb

## 2020-03-01 DIAGNOSIS — R911 Solitary pulmonary nodule: Secondary | ICD-10-CM

## 2020-03-01 NOTE — Patient Instructions (Signed)
History of lung nodules No ongoing health problems  Repeat CT scan of the chest in 1 year to follow stability of nodules If nodules remain stable at that time-radiological follow-up may be discontinued  Patient may follow-up physically after the CT scan

## 2020-03-01 NOTE — Progress Notes (Signed)
Jason Hooper    [id number]    [birth year]  Primary Care Physician:Arnett, Yvetta Coder, FNP  Referring Physician: Burnard Hawthorne, FNP [address] 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 amLODipine (NORVASC) 5 MG tablet Take 1 tablet (5 mg total) by mouth daily.  . cyclobenzaprine (FLEXERIL) 10 MG tablet TAKE 1 TABLET BY MOUTH AT BEDTIME AS NEEDED FOR MUSCLE SPASMS  . imiquimod (ALDARA) 5 % cream   . montelukast (SINGULAIR) 10 MG tablet TAKE 1 TABLET BY MOUTH EVERYDAY AT BEDTIME  . omeprazole (PRILOSEC) 20 MG capsule Take 1 capsule (20 mg total) by mouth daily.  . propranolol (INDERAL) 40 MG tablet TAKE 1 TABLET BY MOUTH TWICE A DAY  . sildenafil (VIAGRA) 100 MG tablet Take 1 tablet (100 mg total) by mouth as needed for erectile dysfunction.  . traMADol (ULTRAM) 50 MG tablet TAKE 1 TABLET (50 MG TOTAL) BY MOUTH EVERY 8 (EIGHT) HOURS AS NEEDED FOR MODERATE PAIN.  . valACYclovir (VALTREX) 500 MG tablet Take 1 tablet (500 mg total) by mouth 2 (two) times daily.   No facility-administered encounter medications on file as of 03/01/2020.    Allergies as of 03/01/2020  . (No Known Allergies)    Past Medical History:  Diagnosis Date  . Alcohol abuse   . Allergy   . Bipolar disorder ([place])   . GERD (gastroesophageal reflux disease)   . Kidney stones 2014    Past Surgical History:  Procedure Laterality Date  . COLONOSCOPY WITH PROPOFOL N/A 01/24/2018   Procedure: COLONOSCOPY WITH  PROPOFOL;  Surgeon: [name], MD;  Location: [facility] ENDOSCOPY;  Service: Gastroenterology;  Laterality: N/A;  . ESOPHAGEAL DILATION    . ESOPHAGOGASTRODUODENOSCOPY (EGD) WITH PROPOFOL N/A 01/24/2018   Procedure: ESOPHAGOGASTRODUODENOSCOPY (EGD) WITH PROPOFOL;  Surgeon: [name], MD;  Location: [facility] ENDOSCOPY;  Service: Gastroenterology;  Laterality: N/A;  . JOINT REPLACEMENT Right 07/16/2017   R great toe  . TONSILECTOMY/ADENOIDECTOMY WITH MYRINGOTOMY      Family History  Adopted: Yes  Family history unknown: Yes    Social History   Socioeconomic History  . Marital status: Single    Spouse name: Not on file  . Number of children: Not on file  . Years of education: Not on file  . Highest education level: Not on file  Occupational History  . Not on file  Tobacco Use  . Smoking status: Never Smoker  . Smokeless tobacco: Former Systems developer    Types: Snuff  Substance and Sexual Activity  . Alcohol use: Yes    Alcohol/week: 1.0 - 2.0 standard drinks    Types: 1 - 2 Cans of beer per week    Comment: occasional  . Drug use: No  . Sexual activity: Yes  Other Topics Concern  . Not on file  Social History Narrative   Adopted.    [employer]- works.    Social Determinants of  Health   Financial Resource Strain:   . Difficulty of Paying Living Expenses:   Food Insecurity:   . Worried About Charity fundraiser in the Last Year:   . Arboriculturist in the Last Year:   Transportation Needs:   . Film/video editor (Medical):   Marland Kitchen Lack of Transportation (Non-Medical):   Physical Activity:   . Days of Exercise per Week:   . Minutes of Exercise per Session:   Stress:   . Feeling of Stress :   Social Connections:   . Frequency of Communication with Friends and Family:   . Frequency of Social Gatherings with Friends and Family:   . Attends Religious Services:   . Active Member of Clubs or Organizations:   . Attends Archivist Meetings:   Marland Kitchen Marital Status:   Intimate Partner  Violence:   . Fear of Current or Ex-Partner:   . Emotionally Abused:   Marland Kitchen Physically Abused:   . Sexually Abused:     Review of Systems  Constitutional: Negative for fatigue.  Respiratory: Negative for shortness of breath.   Cardiovascular: Negative for chest pain.  Musculoskeletal: Negative for arthralgias.    Vitals:   03/01/20 1140  BP: 126/74  Pulse: 86  Temp: 98.2 F (36.8 C)  SpO2: 96%     Physical Exam  Constitutional: He appears well-developed and well-nourished.  HENT:  Head: Normocephalic.  Eyes: Pupils are equal, round, and reactive to light. Right eye exhibits no discharge. Left eye exhibits no discharge.  Neck: No tracheal deviation present. No thyromegaly present.  Cardiovascular: Normal rate and regular rhythm.  Pulmonary/Chest: Breath sounds normal. No respiratory distress. He has no wheezes. He has no rales.     Data Reviewed: CT scan from January 17, 2020 reviewed with the patient CT scan from March 25, 2019 reviewed with the patient CT abdomen from November 2015 reviewed  Assessment:  Multiple lung nodules -It do appear stable over time -Right lung nodule noted on March 25, 2019 CT scan remained stable -Left lower lobe nodule can be traced back to CT abdomen November 2015  Low risk No personal history of cancer  Plan/Recommendations: Repeat CT scan of the chest in 1 year to follow stability  Radiological follow-up may be discontinued at the time if the nodules are stable  Encouraged to call with any significant concerns  Follow-up after CT   Sherrilyn Rist MD  Pulmonary and Critical Care 03/01/2020, 12:02 PM  CC: Burnard Hawthorne, FNP

## 2020-03-15 ENCOUNTER — Other Ambulatory Visit: Payer: Self-pay | Admitting: Family

## 2020-03-15 NOTE — Telephone Encounter (Signed)
Refill request for viagra, last seen 12-22-19, last filled 09-29-19.   Please advise.

## 2020-03-29 ENCOUNTER — Ambulatory Visit (INDEPENDENT_AMBULATORY_CARE_PROVIDER_SITE_OTHER): Payer: BC Managed Care – PPO | Admitting: Family

## 2020-03-29 ENCOUNTER — Other Ambulatory Visit: Payer: Self-pay

## 2020-03-29 VITALS — BP 128/76 | HR 71 | Temp 97.1°F | Resp 16 | Ht 73.0 in | Wt 205.0 lb

## 2020-03-29 DIAGNOSIS — I10 Essential (primary) hypertension: Secondary | ICD-10-CM

## 2020-03-29 DIAGNOSIS — S2242XS Multiple fractures of ribs, left side, sequela: Secondary | ICD-10-CM | POA: Diagnosis not present

## 2020-03-29 DIAGNOSIS — S0231XS Fracture of orbital floor, right side, sequela: Secondary | ICD-10-CM

## 2020-03-29 DIAGNOSIS — S022XXS Fracture of nasal bones, sequela: Secondary | ICD-10-CM | POA: Diagnosis not present

## 2020-03-29 DIAGNOSIS — M545 Low back pain, unspecified: Secondary | ICD-10-CM

## 2020-03-29 DIAGNOSIS — G8929 Other chronic pain: Secondary | ICD-10-CM

## 2020-03-29 MED ORDER — MELOXICAM 7.5 MG PO TABS: 7.5000 mg | ORAL_TABLET | Freq: Every day | ORAL | 1 refills | Status: DC | PRN

## 2020-03-29 MED ORDER — SILDENAFIL CITRATE 100 MG PO TABS: ORAL_TABLET | ORAL | 3 refills | Status: DC

## 2020-03-29 MED ORDER — TRAMADOL HCL 50 MG PO TABS: 50.0000 mg | ORAL_TABLET | Freq: Three times a day (TID) | ORAL | 1 refills | Status: DC | PRN

## 2020-03-29 NOTE — Assessment & Plan Note (Addendum)
Chronic, largely at baseline. Discussed role of heat and anti inflammatories. Trial of prn mobic, especially in setting of elevated BP. Given information regarding medication and prn use on AVS. Close follow up.

## 2020-03-29 NOTE — Progress Notes (Signed)
Subjective:    [name], male    DOB: Jun 02, 1966, 54 y.o.   MRN: [id number]  CC: Jason Hooper is a 54 y.o. male who presents today for follow up.   HPI: Feels well today No new complaints.   Hypertension-compliant with medication.  Adding salt to foods. Takes blood pressure medications at same time during the day. No cp, sob    Chronic low back pain-on tramadol with relief. Has some breakthrough a achey. No joint swelling, increased heat.   Consult pulmonology for lung nodule 03/01/2020-advise repeat CT of the chest in 1 year to follow stability 01/26/2020 consult with urology, Jason Hooper weak urinary stream.  Pending bladder scan, pelvic floor PT   HISTORY:  Past Medical History:  Diagnosis Date  . Alcohol abuse   . Allergy   . Bipolar disorder ([place])   . GERD (gastroesophageal reflux disease)   . Kidney stones 2014   Past Surgical History:  Procedure Laterality Date  . COLONOSCOPY WITH PROPOFOL N/A 01/24/2018   Procedure: COLONOSCOPY WITH PROPOFOL;  Surgeon: Jason Bellows, MD;  Location: [facility] ENDOSCOPY;  Service: Gastroenterology;  Laterality: N/A;  . ESOPHAGEAL DILATION    . ESOPHAGOGASTRODUODENOSCOPY (EGD) WITH PROPOFOL N/A 01/24/2018   Procedure: ESOPHAGOGASTRODUODENOSCOPY (EGD) WITH PROPOFOL;  Surgeon: Jason Bellows, MD;  Location: [facility] ENDOSCOPY;  Service: Gastroenterology;  Laterality: N/A;  . JOINT REPLACEMENT Right 07/16/2017   R great toe  . TONSILECTOMY/ADENOIDECTOMY WITH MYRINGOTOMY     Family History  Adopted: Yes  Family history unknown: Yes    Allergies: Patient has no known allergies. Current Outpatient Medications on File Prior to Visit  Medication Sig Dispense Refill  . amLODipine (NORVASC) 5 MG tablet Take 1 tablet (5 mg total) by mouth daily. 90 tablet 3  . cyclobenzaprine (FLEXERIL) 10 MG tablet TAKE 1 TABLET BY MOUTH AT BEDTIME AS NEEDED FOR MUSCLE SPASMS 30 tablet 2  . imiquimod (ALDARA) 5 % cream     . montelukast (SINGULAIR) 10 MG  tablet TAKE 1 TABLET BY MOUTH EVERYDAY AT BEDTIME 90 tablet 3  . omeprazole (PRILOSEC) 20 MG capsule Take 1 capsule (20 mg total) by mouth daily. 90 capsule 1  . propranolol (INDERAL) 40 MG tablet TAKE 1 TABLET BY MOUTH TWICE A DAY 180 tablet 1  . valACYclovir (VALTREX) 500 MG tablet Take 1 tablet (500 mg total) by mouth 2 (two) times daily. 30 tablet 1   No current facility-administered medications on file prior to visit.    Social History   Tobacco Use  . Smoking status: Never Smoker  . Smokeless tobacco: Former Systems developer    Types: Snuff  Vaping Use  . Vaping Use: Never used  Substance Use Topics  . Alcohol use: Yes    Alcohol/week: 1.0 - 2.0 standard drink    Types: 1 - 2 Cans of beer per week    Comment: occasional  . Drug use: No    Review of Systems  Constitutional: Negative for chills and fever.  Respiratory: Negative for cough.   Cardiovascular: Negative for chest pain and palpitations.  Gastrointestinal: Negative for nausea and vomiting.  Musculoskeletal: Positive for back pain.      Objective:    BP 128/76   Pulse 71   Temp (!) 97.1 F (36.2 C)   Resp 16   Ht 6\' 1"  (1.854 m)   Wt 205 lb (93 kg)   SpO2 99%   BMI 27.05 kg/m  BP Readings from Last 3 Encounters:  03/29/20 128/76  03/01/20 126/74  01/26/20 126/80   Wt Readings from Last 3 Encounters:  03/29/20 205 lb (93 kg)  03/01/20 206 lb 9.6 oz (93.7 kg)  01/26/20 205 lb 1.6 oz (93 kg)    Physical Exam Vitals reviewed.  Constitutional:      Appearance: He is well-developed.  Cardiovascular:     Rate and Rhythm: Regular rhythm.     Heart sounds: Normal heart sounds.  Pulmonary:     Effort: Pulmonary effort is normal. No respiratory distress.     Breath sounds: Normal breath sounds. No wheezing, rhonchi or rales.  Skin:    General: Skin is warm and dry.  Neurological:     Mental Status: He is alert.  Psychiatric:        Speech: Speech normal.        Behavior: Behavior normal.          Assessment & Plan:   Problem List Items Addressed This Visit      Cardiovascular and Mediastinum   HTN (hypertension)    Slightly elevated Advised to take amlodipine at night to see if lowers blood pressure. advised to limit salt. Close follow up      Relevant Medications   sildenafil (VIAGRA) 100 MG tablet     Musculoskeletal and Integument   Closed fracture of nasal bones   Relevant Medications   traMADol (ULTRAM) 50 MG tablet   Closed fracture of right orbital floor (HCC)   Relevant Medications   traMADol (ULTRAM) 50 MG tablet   Multiple closed fractures of ribs of left side   Relevant Medications   traMADol (ULTRAM) 50 MG tablet     Other   Chronic left-sided low back pain without sciatica - Primary    Chronic, largely at baseline. Discussed role of heat and anti inflammatories. Trial of prn mobic, especially in setting of elevated BP. Given information regarding medication and prn use on AVS. Close follow up.       Relevant Medications   traMADol (ULTRAM) 50 MG tablet   meloxicam (MOBIC) 7.5 MG tablet       I am having Jason Hooper. Jason Hooper "Jason Hooper" start on meloxicam. I am also having him maintain his valACYclovir, omeprazole, montelukast, amLODipine, propranolol, imiquimod, cyclobenzaprine, sildenafil, and traMADol.   Meds ordered this encounter  Medications  . sildenafil (VIAGRA) 100 MG tablet    Sig: TAKE 1 TABLET BY MOUTH ONCE DAILY AS NEEDED AS DIRECTED    Dispense:  5 tablet    Refill:  3    Order Specific Question:   Supervising Provider    Answer:   Jason Hooper [[id number]]  . traMADol (ULTRAM) 50 MG tablet    Sig: Take 1 tablet (50 mg total) by mouth every 8 (eight) hours as needed for moderate pain.    Dispense:  90 tablet    Refill:  1    Fill on or after 04/04/20 Not to exceed 2 additional fills before 06/03/2020    Order Specific Question:   Supervising Provider    Answer:   Jason Hooper [[id number]]  . meloxicam (MOBIC) 7.5 MG tablet    Sig: Take 1  tablet (7.5 mg total) by mouth daily as needed for pain.    Dispense:  90 tablet    Refill:  1    Order Specific Question:   Supervising Provider    Answer:   Jason Hooper [[id number]]    Return precautions given.   Risks, benefits, and  alternatives of the medications and treatment plan prescribed today were discussed, and patient expressed understanding.   Education regarding symptom management and diagnosis given to patient on AVS.  Continue to follow with Burnard Hawthorne, FNP for routine health maintenance.   [name] and I agreed with plan.   [name], FNP

## 2020-03-29 NOTE — Patient Instructions (Addendum)
We will do labs at follow up - cholesterol , sugar. Please fast.   Trial mobic as needed for joint pain.   A couple of points in regards to meloxicam ( Mobic) which you are on.   This medication is not intended for daily , long term use. It is a potent anti inflammatory ( NSAID), and my intention is for you take as needed for moderate to severe pain. If you find yourself using daily, please let me know.   Please takes Mobic ( meloxicam) with FOOD since it is an anti-inflammatory as it can cause a GI bleed or ulcer. If you have a history of GI bleed or ulcer, please do NOT take.  Do no take over the counter aleve, motrin, advil, goody's powder for pain as they are also NSAIDs, and they are  in the same class as Mobic  Lastly, we will need to monitor kidney function while on Mobic wer, and if we were to see any decline in kidney function in the future, we would have to discontinue this medication.

## 2020-03-29 NOTE — Assessment & Plan Note (Signed)
Slightly elevated Advised to take amlodipine at night to see if lowers blood pressure. advised to limit salt. Close follow up

## 2020-04-05 ENCOUNTER — Encounter: Payer: Self-pay | Admitting: Urology

## 2020-04-05 ENCOUNTER — Other Ambulatory Visit: Payer: Self-pay

## 2020-04-05 ENCOUNTER — Ambulatory Visit (INDEPENDENT_AMBULATORY_CARE_PROVIDER_SITE_OTHER): Payer: BC Managed Care – PPO | Admitting: Urology

## 2020-04-05 VITALS — BP 143/89 | HR 65 | Ht 73.0 in | Wt 205.0 lb

## 2020-04-05 DIAGNOSIS — R3912 Poor urinary stream: Secondary | ICD-10-CM

## 2020-04-05 LAB — BLADDER SCAN AMB NON-IMAGING

## 2020-04-05 MED ORDER — LIDOCAINE HCL URETHRAL/MUCOSAL 2 % EX GEL
1.0000 "application " | Freq: Once | CUTANEOUS | Status: AC
  Administered 2020-04-05: 1 via URETHRAL

## 2020-04-05 NOTE — Progress Notes (Signed)
   04/05/20  CC:  Chief Complaint  Patient presents with  . Cysto    HPI:  F/u - LUTS - Jason Hooper c/o occasional weak stream and decreased ejaculation. His main bother is tenesmus. He underwent CT of the A/P June 2020 and his prostate was not enlarged. His PSA was 0.4 on 12/27/2019. A renal US was benign Aug 2020 with bilateral renal cysts (also seen on the CT). He's noticed he feels like he needs to have a BM every time he sits down. No alpha blockers. No gross hematuria. AUASS = 10. Doesn't recall urethral trauma or STI. SHIM = 17.   I recommended pelvic floor PT for his GI and GU symptoms. He returns for cystoscopy today. PVR 18 ml.   There were no vitals taken for this visit. NED. A&Ox3.   No respiratory distress   Abd soft, NT, ND Normal phallus with bilateral descended testicles  Cystoscopy Procedure Note  Patient identification was confirmed, informed consent was obtained, and patient was prepped using Betadine solution.  Lidocaine jelly was administered per urethral meatus.     Pre-Procedure: - Inspection reveals a normal caliber ureteral meatus.  Procedure: The flexible cystoscope was introduced without difficulty - No urethral strictures/lesions are present. - prostate short, nonobstructive  - normal bladder neck - Bilateral ureteral orifices identified - Bladder mucosa  reveals no ulcers, tumors, or lesions - No bladder stones - No trabeculation  Retroflexion shows normal bladder, bladder neck  Oki - chaperone/asst   Post-Procedure: - Patient tolerated the procedure well  Assessment/ Plan:  Weak st - again discussed PT and recommended it. He will consider. Also discussed alpha blockers.   No follow-ups on file.  Festus Aloe, MD

## 2020-04-08 LAB — URINALYSIS, COMPLETE
Bilirubin, UA: NEGATIVE
Glucose, UA: NEGATIVE
Ketones, UA: NEGATIVE
Leukocytes,UA: NEGATIVE
Nitrite, UA: NEGATIVE
Protein,UA: NEGATIVE
RBC, UA: NEGATIVE
Specific Gravity, UA: 1.02 (ref 1.005–1.030)
Urobilinogen, Ur: 0.2 mg/dL (ref 0.2–1.0)
pH, UA: 7 (ref 5.0–7.5)

## 2020-04-08 LAB — MICROSCOPIC EXAMINATION
Bacteria, UA: NONE SEEN
Epithelial Cells (non renal): NONE SEEN /hpf (ref 0–10)
RBC, Urine: NONE SEEN /hpf (ref 0–2)

## 2020-04-24 ENCOUNTER — Telehealth: Payer: Self-pay | Admitting: Family

## 2020-04-24 ENCOUNTER — Other Ambulatory Visit: Payer: Self-pay | Admitting: Family

## 2020-04-24 MED ORDER — SILDENAFIL CITRATE 100 MG PO TABS: ORAL_TABLET | ORAL | 3 refills | Status: DC

## 2020-04-24 NOTE — Telephone Encounter (Signed)
Called patient to let him know that prescription was sent to [facility]

## 2020-04-24 NOTE — Telephone Encounter (Signed)
Pt's sildenafil (VIAGRA) 100 MG tablet was sent to wrong pharmacy. Please send it to [facility] in [place].

## 2020-04-24 NOTE — Telephone Encounter (Signed)
I resent  Please let him know

## 2020-05-06 ENCOUNTER — Other Ambulatory Visit: Payer: Self-pay | Admitting: Family

## 2020-05-06 DIAGNOSIS — G8929 Other chronic pain: Secondary | ICD-10-CM

## 2020-05-09 ENCOUNTER — Telehealth: Payer: Self-pay | Admitting: Family

## 2020-05-09 NOTE — Telephone Encounter (Signed)
Pt wanted to know if he was due for his 2nd shingles shot

## 2020-05-09 NOTE — Telephone Encounter (Signed)
Please look into last shingrex vaccine and let me know if due per schedule  Call pt and sch nurse visit as appropriate

## 2020-05-14 NOTE — Telephone Encounter (Signed)
2-6 months from first one

## 2020-05-15 ENCOUNTER — Telehealth: Payer: Self-pay

## 2020-05-15 NOTE — Telephone Encounter (Signed)
Called patient and scheduled for 05/24/20 at 3pm for a nurse visit.

## 2020-05-15 NOTE — Telephone Encounter (Signed)
Called patient to schedule for the next vaccination for Shingles. Jason Hooper requests only Fridays as he is out of town Monday through Thursday. He requests Friday Aug 13th at 3pm.

## 2020-05-15 NOTE — Telephone Encounter (Signed)
Patient has been scheduled

## 2020-05-24 ENCOUNTER — Other Ambulatory Visit: Payer: Self-pay

## 2020-05-24 ENCOUNTER — Ambulatory Visit (INDEPENDENT_AMBULATORY_CARE_PROVIDER_SITE_OTHER): Payer: BC Managed Care – PPO

## 2020-05-24 DIAGNOSIS — Z23 Encounter for immunization: Secondary | ICD-10-CM | POA: Diagnosis not present

## 2020-05-24 NOTE — Progress Notes (Signed)
Patient presented for shingrix injection to right deltoid, patient voiced no concerns nor showed any signs of distress during injection. 

## 2020-06-05 ENCOUNTER — Other Ambulatory Visit: Payer: Self-pay | Admitting: Family

## 2020-06-05 DIAGNOSIS — S0231XS Fracture of orbital floor, right side, sequela: Secondary | ICD-10-CM

## 2020-06-05 DIAGNOSIS — S022XXS Fracture of nasal bones, sequela: Secondary | ICD-10-CM

## 2020-06-05 DIAGNOSIS — S2242XS Multiple fractures of ribs, left side, sequela: Secondary | ICD-10-CM
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
## 2020-07-05 ENCOUNTER — Other Ambulatory Visit: Payer: Self-pay | Admitting: Family

## 2020-07-18 ENCOUNTER — Other Ambulatory Visit: Payer: Self-pay | Admitting: Family

## 2020-07-18 DIAGNOSIS — I10 Essential (primary) hypertension: Secondary | ICD-10-CM

## 2020-07-19 ENCOUNTER — Ambulatory Visit: Payer: BC Managed Care – PPO | Admitting: Family

## 2020-08-05 ENCOUNTER — Other Ambulatory Visit: Payer: Self-pay | Admitting: Family

## 2020-08-05 DIAGNOSIS — S022XXS Fracture of nasal bones, sequela: Secondary | ICD-10-CM

## 2020-08-05 DIAGNOSIS — S0231XS Fracture of orbital floor, right side, sequela: Secondary | ICD-10-CM

## 2020-08-05 DIAGNOSIS — G8929 Other chronic pain: Secondary | ICD-10-CM

## 2020-08-05 DIAGNOSIS — S2242XS Multiple fractures of ribs, left side, sequela: Secondary | ICD-10-CM

## 2020-08-05 DIAGNOSIS — M545 Low back pain, unspecified: Secondary | ICD-10-CM

## 2020-08-30 ENCOUNTER — Encounter: Payer: Self-pay | Admitting: Family

## 2020-08-30 ENCOUNTER — Other Ambulatory Visit: Payer: Self-pay

## 2020-08-30 ENCOUNTER — Ambulatory Visit (INDEPENDENT_AMBULATORY_CARE_PROVIDER_SITE_OTHER): Payer: BC Managed Care – PPO | Admitting: Family

## 2020-08-30 VITALS — BP 118/82 | HR 65 | Temp 97.6°F | Ht 73.0 in | Wt 213.2 lb

## 2020-08-30 DIAGNOSIS — M545 Low back pain, unspecified: Secondary | ICD-10-CM | POA: Diagnosis not present

## 2020-08-30 DIAGNOSIS — I10 Essential (primary) hypertension: Secondary | ICD-10-CM

## 2020-08-30 DIAGNOSIS — N529 Male erectile dysfunction, unspecified: Secondary | ICD-10-CM

## 2020-08-30 DIAGNOSIS — Z13220 Encounter for screening for lipoid disorders: Secondary | ICD-10-CM | POA: Diagnosis not present

## 2020-08-30 DIAGNOSIS — G8929 Other chronic pain: Secondary | ICD-10-CM

## 2020-08-30 DIAGNOSIS — Z136 Encounter for screening for cardiovascular disorders: Secondary | ICD-10-CM

## 2020-08-30 LAB — LIPID PANEL
Cholesterol: 167 mg/dL (ref 0–200)
HDL: 36.3 mg/dL — ABNORMAL LOW (ref >39.00)
LDL Cholesterol: 108 mg/dL — ABNORMAL HIGH (ref 0–99)
NonHDL: 130.7
Total CHOL/HDL Ratio: 5
Triglycerides: 113 mg/dL (ref 0.0–149.0)
VLDL: 22.6 mg/dL (ref 0.0–40.0)

## 2020-08-30 MED ORDER — SILDENAFIL CITRATE 100 MG PO TABS: ORAL_TABLET | ORAL | 3 refills | Status: DC

## 2020-08-30 NOTE — Progress Notes (Signed)
Subjective:    [name], male    DOB: 1966-06-09, 54 y.o.   MRN: [id number]  CC: Jason Hooper is a 54 y.o. male who presents today for follow up.   HPI:  Left low back pain- at baseline. Taking tramadol 3 tablets per day with control of left low back pain.   He has been having left frontal , lateral left hip pain, 6 months, unchanged. Describes as constant. No injury.  Most painful when sits for period of time. Has stiffness when stand. Painful to sleep on left hip. Pain is more noticeable at night. Takes mobic relief.   No trouble urinating or having a bowel movement. No saddle anesthesia, left leg numbness or weakness.   HTN-compliant with amlodipine. No CP, sob.   ED- viagra prn works well. He would like refill with 30 tablets due to  Price.  He would like cholesterol checked today   HISTORY:  Past Medical History:  Diagnosis Date  . Alcohol abuse   . Allergy   . Bipolar disorder ([place])   . GERD (gastroesophageal reflux disease)   . Kidney stones 2014   Past Surgical History:  Procedure Laterality Date  . COLONOSCOPY WITH PROPOFOL N/A 01/24/2018   Procedure: COLONOSCOPY WITH PROPOFOL;  Surgeon: [name], MD;  Location: [facility] ENDOSCOPY;  Service: Gastroenterology;  Laterality: N/A;  . ESOPHAGEAL DILATION    . ESOPHAGOGASTRODUODENOSCOPY (EGD) WITH PROPOFOL N/A 01/24/2018   Procedure: ESOPHAGOGASTRODUODENOSCOPY (EGD) WITH PROPOFOL;  Surgeon: [name], MD;  Location: [facility] ENDOSCOPY;  Service: Gastroenterology;  Laterality: N/A;  . JOINT REPLACEMENT Right 07/16/2017   R great toe  . TONSILECTOMY/ADENOIDECTOMY WITH MYRINGOTOMY     Family History  Adopted: Yes  Family history unknown: Yes    Allergies: Patient has no known allergies. Current Outpatient Medications on File Prior to Visit  Medication Sig Dispense Refill  . amLODipine (NORVASC) 5 MG tablet Take 1 tablet (5 mg total) by mouth daily. 90 tablet 3  . cyclobenzaprine (FLEXERIL) 10 MG tablet  TAKE 1 TABLET BY MOUTH AT BEDTIME AS NEEDED FOR MUSCLE SPASMS 30 tablet 2  . imiquimod (ALDARA) 5 % cream     . meloxicam (MOBIC) 7.5 MG tablet Take 1 tablet (7.5 mg total) by mouth daily as needed for pain. 90 tablet 1  . montelukast (SINGULAIR) 10 MG tablet TAKE 1 TABLET BY MOUTH EVERYDAY AT BEDTIME 90 tablet 3  . omeprazole (PRILOSEC) 20 MG capsule Take 1 capsule (20 mg total) by mouth daily. 90 capsule 1  . propranolol (INDERAL) 40 MG tablet TAKE 1 TABLET BY MOUTH TWICE A DAY 180 tablet 1  . traMADol (ULTRAM) 50 MG tablet TAKE 1 TABLET BY MOUTH EVERY 8 HOURS AS NEEDED FOR MODERATE PAIN. 90 tablet 1   No current facility-administered medications on file prior to visit.    Social History   Tobacco Use  . Smoking status: Never Smoker  . Smokeless tobacco: Former Systems developer    Types: Snuff  Vaping Use  . Vaping Use: Never used  Substance Use Topics  . Alcohol use: Yes    Alcohol/week: 1.0 - 2.0 standard drink    Types: 1 - 2 Cans of beer per week    Comment: occasional  . Drug use: No    Review of Systems  Constitutional: Negative for chills and fever.  Respiratory: Negative for cough.   Cardiovascular: Negative for chest pain and palpitations.  Gastrointestinal: Negative for nausea and vomiting.  Musculoskeletal: Positive for  arthralgias and back pain.  Neurological: Negative for numbness.      Objective:    BP 118/82   Pulse 65   Temp 97.6 F (36.4 C)   Ht 6\' 1"  (1.854 m)   Wt 213 lb 3.2 oz (96.7 kg)   SpO2 98%   BMI 28.13 kg/m  BP Readings from Last 3 Encounters:  08/30/20 118/82  04/05/20 (!) 143/89  03/29/20 128/76   Wt Readings from Last 3 Encounters:  08/30/20 213 lb 3.2 oz (96.7 kg)  04/05/20 205 lb (93 kg)  03/29/20 205 lb (93 kg)    Physical Exam Vitals reviewed.  Constitutional:      Appearance: He is well-developed.  Cardiovascular:     Rate and Rhythm: Regular rhythm.     Heart sounds: Normal heart sounds.  Pulmonary:     Effort: Pulmonary  effort is normal. No respiratory distress.     Breath sounds: Normal breath sounds. No wheezing, rhonchi or rales.  Skin:    General: Skin is warm and dry.  Neurological:     Mental Status: He is alert.  Psychiatric:        Speech: Speech normal.        Behavior: Behavior normal.        Assessment & Plan:   Problem List Items Addressed This Visit      Cardiovascular and Mediastinum   HTN (hypertension)    Stable, continue amlodipine 2.5mg .       Relevant Medications   sildenafil (VIAGRA) 100 MG tablet     Other   Chronic left-sided low back pain without sciatica    Chronic stable. Offered xray or MRI of low back, hip. He declines at this time as overall feels pain is at baseline and manageable on current regimen. Continue tramadol 50mg  TID prn and mobic 7.5mg  QD prn.       Erectile dysfunction    Stable. Continue viagra 100mg  QD prn.      Relevant Medications   sildenafil (VIAGRA) 100 MG tablet    Other Visit Diagnoses    Encounter for lipid screening for cardiovascular disease    -  Primary   Relevant Orders   Lipid panel       I have discontinued [name]. Mccomas "Michael"'s valACYclovir. I am also having him maintain his omeprazole, amLODipine, imiquimod, meloxicam, montelukast, propranolol, traMADol, cyclobenzaprine, and sildenafil.   Meds ordered this encounter  Medications  . sildenafil (VIAGRA) 100 MG tablet    Sig: TAKE 1 TABLET BY MOUTH ONCE DAILY AS NEEDED    Dispense:  30 tablet    Refill:  3    Order Specific Question:   Supervising Provider    Answer:   Crecencio Mc [[id number]]    Return precautions given.   Risks, benefits, and alternatives of the medications and treatment plan prescribed today were discussed, and patient expressed understanding.   Education regarding symptom management and diagnosis given to patient on AVS.  Continue to follow with Burnard Hawthorne, FNP for routine health maintenance.   [name] and I agreed with plan.    [name], FNP

## 2020-08-30 NOTE — Patient Instructions (Signed)
Nice to see you!   

## 2020-08-30 NOTE — Assessment & Plan Note (Signed)
Chronic stable. Offered xray or MRI of low back, hip. He declines at this time as overall feels pain is at baseline and manageable on current regimen. Continue tramadol 50mg  TID prn and mobic 7.5mg  QD prn.

## 2020-08-30 NOTE — Assessment & Plan Note (Signed)
Stable, continue amlodipine 2.5mg .

## 2020-08-30 NOTE — Assessment & Plan Note (Signed)
Stable. Continue viagra 100mg  QD prn.

## 2020-09-09 ENCOUNTER — Encounter: Payer: Self-pay | Admitting: Family

## 2020-09-10 ENCOUNTER — Other Ambulatory Visit: Payer: Self-pay | Admitting: Family

## 2020-09-10 ENCOUNTER — Telehealth: Payer: Self-pay | Admitting: Family

## 2020-09-10 ENCOUNTER — Encounter: Payer: Self-pay | Admitting: Family

## 2020-09-10 DIAGNOSIS — G8929 Other chronic pain: Secondary | ICD-10-CM

## 2020-09-10 DIAGNOSIS — M545 Low back pain, unspecified: Secondary | ICD-10-CM

## 2020-09-10 NOTE — Telephone Encounter (Signed)
lft vm for pt to call ofc to sch MRI. 

## 2020-09-19 ENCOUNTER — Other Ambulatory Visit: Payer: Self-pay | Admitting: Family

## 2020-09-19 DIAGNOSIS — G8929 Other chronic pain: Secondary | ICD-10-CM

## 2020-09-19 DIAGNOSIS — M545 Low back pain, unspecified: Secondary | ICD-10-CM

## 2020-09-24 ENCOUNTER — Ambulatory Visit
Admission: RE | Admit: 2020-09-24 | Discharge: 2020-09-24 | Disposition: A | Discharge status: Home / Self Care | Payer: BC Managed Care – PPO | Source: Ambulatory Visit | Attending: Family | Admitting: Family

## 2020-09-24 ENCOUNTER — Other Ambulatory Visit: Payer: Self-pay

## 2020-09-24 DIAGNOSIS — M545 Low back pain, unspecified: Secondary | ICD-10-CM | POA: Diagnosis not present

## 2020-09-24 DIAGNOSIS — G8929 Other chronic pain: Secondary | ICD-10-CM

## 2020-09-24 DIAGNOSIS — M25552 Pain in left hip: Secondary | ICD-10-CM | POA: Diagnosis not present

## 2020-09-24 DIAGNOSIS — D1809 Hemangioma of other sites: Secondary | ICD-10-CM | POA: Diagnosis not present

## 2020-09-24 DIAGNOSIS — M533 Sacrococcygeal disorders, not elsewhere classified: Secondary | ICD-10-CM | POA: Diagnosis not present

## 2020-09-24 DIAGNOSIS — M5127 Other intervertebral disc displacement, lumbosacral region: Secondary | ICD-10-CM | POA: Diagnosis not present

## 2020-10-02 ENCOUNTER — Ambulatory Visit: Payer: Self-pay | Admitting: Urology

## 2020-10-03 ENCOUNTER — Other Ambulatory Visit: Payer: Self-pay | Admitting: Family

## 2020-10-03 DIAGNOSIS — S2242XS Multiple fractures of ribs, left side, sequela: Secondary | ICD-10-CM

## 2020-10-03 DIAGNOSIS — S0231XS Fracture of orbital floor, right side, sequela: Secondary | ICD-10-CM

## 2020-10-03 DIAGNOSIS — S022XXS Fracture of nasal bones, sequela: Secondary | ICD-10-CM

## 2020-10-07 ENCOUNTER — Encounter: Payer: Self-pay | Admitting: Family

## 2020-11-04 ENCOUNTER — Other Ambulatory Visit: Payer: Self-pay | Admitting: Family

## 2020-11-04 DIAGNOSIS — G8929 Other chronic pain: Secondary | ICD-10-CM

## 2020-11-04 DIAGNOSIS — M545 Low back pain, unspecified: Secondary | ICD-10-CM

## 2020-12-03 ENCOUNTER — Other Ambulatory Visit: Payer: Self-pay | Admitting: Family

## 2020-12-03 DIAGNOSIS — S0231XS Fracture of orbital floor, right side, sequela: Secondary | ICD-10-CM

## 2020-12-03 DIAGNOSIS — S2242XS Multiple fractures of ribs, left side, sequela: Secondary | ICD-10-CM

## 2020-12-03 DIAGNOSIS — S022XXS Fracture of nasal bones, sequela: Secondary | ICD-10-CM

## 2020-12-04 ENCOUNTER — Telehealth: Payer: Self-pay | Admitting: Family

## 2020-12-04 NOTE — Telephone Encounter (Signed)
Patient has been notified and appointment has been scheduled.

## 2020-12-04 NOTE — Telephone Encounter (Signed)
Call pt   I have refilled your tramadol  However I wanted to remind you that this is controlled substance.   In order for me to prescribe medication,  patients must be seen every 3 months.   Please make follow-up appointment this month for any further refills.    I looked up patient on [place] Controlled Substances Reporting System and saw no activity that raised concern of inappropriate use.   

## 2020-12-06 ENCOUNTER — Encounter: Payer: Self-pay | Admitting: Family

## 2020-12-06 ENCOUNTER — Other Ambulatory Visit: Payer: Self-pay

## 2020-12-06 ENCOUNTER — Ambulatory Visit (INDEPENDENT_AMBULATORY_CARE_PROVIDER_SITE_OTHER): Payer: BC Managed Care – PPO | Admitting: Family

## 2020-12-06 DIAGNOSIS — R911 Solitary pulmonary nodule: Secondary | ICD-10-CM | POA: Diagnosis not present

## 2020-12-06 DIAGNOSIS — G8929 Other chronic pain: Secondary | ICD-10-CM

## 2020-12-06 DIAGNOSIS — I10 Essential (primary) hypertension: Secondary | ICD-10-CM | POA: Diagnosis not present

## 2020-12-06 DIAGNOSIS — M545 Low back pain, unspecified: Secondary | ICD-10-CM

## 2020-12-06 DIAGNOSIS — N281 Cyst of kidney, acquired: Secondary | ICD-10-CM

## 2020-12-06 NOTE — Assessment & Plan Note (Signed)
Chronic at baseline. Continue tramadol 50mg  q8 hrs

## 2020-12-06 NOTE — Assessment & Plan Note (Signed)
Controlled . Continue amlodipine 5mg , propranolol 40mg  bid

## 2020-12-06 NOTE — Progress Notes (Signed)
Subjective:    [name], male    DOB: 1966/06/12, 55 y.o.   MRN: [id number]  CC: Jason Hooper is a 55 y.o. male who presents today for follow up.   HPI: Feels well today No new concerns.   Left low back pain is unchanged. Reports as baseline. No numbness in legs, trouble urinating.  Taking tramadol 3 tablets per day with control of pain.   Left hip pain has improved and he doesn't notice pain anymore.   HTN- compliant with amlodipine, propranolol. No cp.      HISTORY:  Past Medical History:  Diagnosis Date  . Alcohol abuse   . Allergy   . Bipolar disorder ([place])   . GERD (gastroesophageal reflux disease)   . Kidney stones 2014   Past Surgical History:  Procedure Laterality Date  . COLONOSCOPY WITH PROPOFOL N/A 01/24/2018   Procedure: COLONOSCOPY WITH PROPOFOL;  Surgeon: [name], MD;  Location: [facility] ENDOSCOPY;  Service: Gastroenterology;  Laterality: N/A;  . ESOPHAGEAL DILATION    . ESOPHAGOGASTRODUODENOSCOPY (EGD) WITH PROPOFOL N/A 01/24/2018   Procedure: ESOPHAGOGASTRODUODENOSCOPY (EGD) WITH PROPOFOL;  Surgeon: [name], MD;  Location: [facility] ENDOSCOPY;  Service: Gastroenterology;  Laterality: N/A;  . JOINT REPLACEMENT Right 07/16/2017   R great toe  . TONSILECTOMY/ADENOIDECTOMY WITH MYRINGOTOMY     Family History  Adopted: Yes  Family history unknown: Yes    Allergies: Patient has no known allergies. Current Outpatient Medications on File Prior to Visit  Medication Sig Dispense Refill  . amLODipine (NORVASC) 5 MG tablet Take 1 tablet (5 mg total) by mouth daily. 90 tablet 3  . cyclobenzaprine (FLEXERIL) 10 MG tablet TAKE 1 TABLET BY MOUTH AT BEDTIME AS NEEDED FOR MUSCLE SPASMS. 30 tablet 2  . imiquimod (ALDARA) 5 % cream     . meloxicam (MOBIC) 7.5 MG tablet TAKE 1 TABLET BY MOUTH DAILY AS NEEDED FOR PAIN 90 tablet 1  . montelukast (SINGULAIR) 10 MG tablet TAKE 1 TABLET BY MOUTH EVERYDAY AT BEDTIME 90 tablet 3  . omeprazole (PRILOSEC) 20 MG  capsule Take 1 capsule (20 mg total) by mouth daily. 90 capsule 1  . propranolol (INDERAL) 40 MG tablet TAKE 1 TABLET BY MOUTH TWICE A DAY 180 tablet 1  . sildenafil (VIAGRA) 100 MG tablet TAKE 1 TABLET BY MOUTH ONCE DAILY AS NEEDED 30 tablet 3  . traMADol (ULTRAM) 50 MG tablet TAKE 1 TABLET BY MOUTH EVERY 8 HOURS AS NEEDED FOR MODERATE PAIN. 90 tablet 1   No current facility-administered medications on file prior to visit.    Social History   Tobacco Use  . Smoking status: Never Smoker  . Smokeless tobacco: Former Systems developer    Types: Snuff  Vaping Use  . Vaping Use: Never used  Substance Use Topics  . Alcohol use: Yes    Alcohol/week: 1.0 - 2.0 standard drink    Types: 1 - 2 Cans of beer per week    Comment: occasional  . Drug use: No    Review of Systems  Constitutional: Negative for chills and fever.  Respiratory: Negative for cough.   Cardiovascular: Negative for chest pain and palpitations.  Gastrointestinal: Negative for nausea and vomiting.  Musculoskeletal: Positive for back pain.  Neurological: Negative for numbness.      Objective:    BP 118/80   Pulse 67   Temp (!) 97.5 F (36.4 C)   Ht 6\' 1"  (1.854 m)   Wt 216 lb (98 kg)  SpO2 99%   BMI 28.50 kg/m  BP Readings from Last 3 Encounters:  12/06/20 118/80  08/30/20 118/82  04/05/20 (!) 143/89   Wt Readings from Last 3 Encounters:  12/06/20 216 lb (98 kg)  08/30/20 213 lb 3.2 oz (96.7 kg)  04/05/20 205 lb (93 kg)    Physical Exam Vitals reviewed.  Constitutional:      Appearance: He is well-developed and well-nourished.  Cardiovascular:     Rate and Rhythm: Regular rhythm.     Heart sounds: Normal heart sounds.  Pulmonary:     Effort: Pulmonary effort is normal. No respiratory distress.     Breath sounds: Normal breath sounds. No wheezing, rhonchi or rales.  Skin:    General: Skin is warm and dry.  Neurological:     Mental Status: He is alert.  Psychiatric:        Mood and Affect: Mood and  affect normal.        Speech: Speech normal.        Behavior: Behavior normal.        Assessment & Plan:   Problem List Items Addressed This Visit      Cardiovascular and Mediastinum   HTN (hypertension)    Controlled . Continue amlodipine 5mg , propranolol 40mg  bid        Genitourinary   Renal cyst, right     Other   Chronic left-sided low back pain without sciatica    Chronic at baseline. Continue tramadol 50mg  q8 hrs      Pulmonary nodule, right     Declines cmp, psa lab today. He would like to do at follow up.  I am having [name]. Hout "[name]" maintain his omeprazole, amLODipine, imiquimod, montelukast, propranolol, sildenafil, meloxicam, cyclobenzaprine, and traMADol.   No orders of the defined types were placed in this encounter.   Return precautions given.   Risks, benefits, and alternatives of the medications and treatment plan prescribed today were discussed, and patient expressed understanding.   Education regarding symptom management and diagnosis given to patient on AVS.  Continue to follow with Burnard Hawthorne, FNP for routine health maintenance.   [name] and I agreed with plan.   [name], FNP

## 2021-01-22 ENCOUNTER — Other Ambulatory Visit: Payer: Self-pay | Admitting: Family

## 2021-01-22 DIAGNOSIS — I10 Essential (primary) hypertension: Secondary | ICD-10-CM

## 2021-02-01 ENCOUNTER — Other Ambulatory Visit: Payer: Self-pay | Admitting: Family

## 2021-02-01 DIAGNOSIS — S022XXS Fracture of nasal bones, sequela: Secondary | ICD-10-CM

## 2021-02-01 DIAGNOSIS — S2242XS Multiple fractures of ribs, left side, sequela: Secondary | ICD-10-CM

## 2021-02-01 DIAGNOSIS — S0231XS Fracture of orbital floor, right side, sequela: Secondary | ICD-10-CM

## 2021-02-01 DIAGNOSIS — G8929 Other chronic pain: Secondary | ICD-10-CM

## 2021-02-01 DIAGNOSIS — M545 Low back pain, unspecified: Secondary | ICD-10-CM

## 2021-02-03 ENCOUNTER — Telehealth: Payer: Self-pay | Admitting: Family

## 2021-02-03 NOTE — Telephone Encounter (Signed)
LMTCB patient needs to be scheduled for f/u appointment sometime around 5/25.

## 2021-02-03 NOTE — Telephone Encounter (Signed)
Call pt   I have refilled your tramadol  However I wanted to remind you that this is controlled substance.   In order for me to prescribe medication,  patients must be seen every 3 months.   Please make follow-up appointment this month for any further refills.    I looked up patient on [place] Controlled Substances Reporting System and saw no activity that raised concern of inappropriate use.   

## 2021-02-03 NOTE — Telephone Encounter (Signed)
RX Refill:tramadol Last Seen:12-06-20 Last ordered:12-04-20

## 2021-02-04 NOTE — Telephone Encounter (Signed)
Patient returned your call he has been scheduled for 03/14/21 @ 1:30.

## 2021-02-26 ENCOUNTER — Telehealth: Payer: Self-pay | Admitting: Pulmonary Disease

## 2021-02-26 NOTE — Telephone Encounter (Signed)
Ensure patient has CT ordered and has a follow-up appointment  Lung nodules- noted on last years CT

## 2021-02-26 NOTE — Telephone Encounter (Signed)
Lm x1 for patient.  

## 2021-03-01 ENCOUNTER — Other Ambulatory Visit: Payer: Self-pay | Admitting: Family

## 2021-03-01 DIAGNOSIS — M545 Low back pain, unspecified: Secondary | ICD-10-CM

## 2021-03-01 DIAGNOSIS — G8929 Other chronic pain: Secondary | ICD-10-CM

## 2021-03-14 ENCOUNTER — Encounter: Payer: Self-pay | Admitting: Family

## 2021-03-14 ENCOUNTER — Ambulatory Visit (INDEPENDENT_AMBULATORY_CARE_PROVIDER_SITE_OTHER): Payer: BC Managed Care – PPO | Admitting: Family

## 2021-03-14 ENCOUNTER — Other Ambulatory Visit: Payer: Self-pay

## 2021-03-14 VITALS — BP 118/80 | HR 63 | Temp 97.8°F | Ht 73.0 in | Wt 213.2 lb

## 2021-03-14 DIAGNOSIS — G8929 Other chronic pain: Secondary | ICD-10-CM

## 2021-03-14 DIAGNOSIS — I10 Essential (primary) hypertension: Secondary | ICD-10-CM

## 2021-03-14 DIAGNOSIS — M545 Low back pain, unspecified: Secondary | ICD-10-CM | POA: Diagnosis not present

## 2021-03-14 DIAGNOSIS — Z125 Encounter for screening for malignant neoplasm of prostate: Secondary | ICD-10-CM

## 2021-03-14 DIAGNOSIS — Z23 Encounter for immunization: Secondary | ICD-10-CM

## 2021-03-14 LAB — PSA: PSA: 0.3 ng/mL (ref 0.10–4.00)

## 2021-03-14 NOTE — Progress Notes (Signed)
Subjective:    [name], male    DOB: [birth year], 55 y.o.   MRN: [id number]  CC: [name] is a 55 y.o. male who presents today for follow up.   HPI: Feels well today No complaints  No recent flares of low back pain.   HTN- compliant with amlodipine 5mg , propranolol 40mg  QD  Chronic left sided back pain- compliant with tramadol 50mg  q8 hours, mobic 7.5mg  qd which is effective.    no trouble urinating, decreased urine stream or  Trouble having a bowel movement.    MR sacrum and lumbar spine 09/2020 significant for Small disc protrusion at L5-S1,Mild degenerative changes .        HISTORY:  Past Medical History:  Diagnosis Date  . Alcohol abuse   . Allergy   . Bipolar disorder ([place])   . GERD (gastroesophageal reflux disease)   . Kidney stones 2014   Past Surgical History:  Procedure Laterality Date  . COLONOSCOPY WITH PROPOFOL N/A 01/24/2018   Procedure: COLONOSCOPY WITH PROPOFOL;  Surgeon: [name], MD;  Location: [facility] ENDOSCOPY;  Service: Gastroenterology;  Laterality: N/A;  . ESOPHAGEAL DILATION    . ESOPHAGOGASTRODUODENOSCOPY (EGD) WITH PROPOFOL N/A 01/24/2018   Procedure: ESOPHAGOGASTRODUODENOSCOPY (EGD) WITH PROPOFOL;  Surgeon: [name], MD;  Location: [facility] ENDOSCOPY;  Service: Gastroenterology;  Laterality: N/A;  . JOINT REPLACEMENT Right 07/16/2017   R great toe  . TONSILECTOMY/ADENOIDECTOMY WITH MYRINGOTOMY     Family History  Adopted: Yes  Family history unknown: Yes    Allergies: Patient has no known allergies. Current Outpatient Medications on File Prior to Visit  Medication Sig Dispense Refill  . amLODipine (NORVASC) 5 MG tablet TAKE 1 TABLET BY MOUTH EVERY DAY 90 tablet 2  . cyclobenzaprine (FLEXERIL) 10 MG tablet TAKE 1 TABLET BY MOUTH AT BEDTIME AS NEEDED FOR MUSCLE SPASMS. 30 tablet 2  . imiquimod (ALDARA) 5 % cream     . meloxicam (MOBIC) 7.5 MG tablet TAKE 1 TABLET BY MOUTH EVERY DAY AS NEEDED FOR PAIN 90 tablet 1  .  montelukast (SINGULAIR) 10 MG tablet TAKE 1 TABLET BY MOUTH EVERYDAY AT BEDTIME 90 tablet 3  . omeprazole (PRILOSEC) 20 MG capsule Take 1 capsule (20 mg total) by mouth daily. 90 capsule 1  . propranolol (INDERAL) 40 MG tablet TAKE 1 TABLET BY MOUTH TWICE A DAY 180 tablet 1  . sildenafil (VIAGRA) 100 MG tablet TAKE 1 TABLET BY MOUTH ONCE DAILY AS NEEDED 30 tablet 3  . traMADol (ULTRAM) 50 MG tablet TAKE 1 TABLET BY MOUTH EVERY 8 HOURS AS NEEDED FOR MODERATE PAIN. 90 tablet 1   No current facility-administered medications on file prior to visit.    Social History   Tobacco Use  . Smoking status: Never Smoker  . Smokeless tobacco: Former Systems developer    Types: Snuff  Vaping Use  . Vaping Use: Never used  Substance Use Topics  . Alcohol use: Yes    Alcohol/week: 1.0 - 2.0 standard drink    Types: 1 - 2 Cans of beer per week    Comment: occasional  . Drug use: No    Review of Systems  Constitutional: Negative for chills and fever.  Respiratory: Negative for cough.   Cardiovascular: Negative for chest pain and palpitations.  Gastrointestinal: Negative for nausea and vomiting.  Musculoskeletal: Positive for back pain.      Objective:    BP 118/80 (BP Location: Left Arm, Patient Position: Sitting, Cuff Size: Large)  Pulse 63   Temp 97.8 F (36.6 C) (Oral)   Ht 6\' 1"  (1.854 m)   Wt 213 lb 3.2 oz (96.7 kg)   SpO2 99%   BMI 28.13 kg/m  BP Readings from Last 3 Encounters:  03/14/21 118/80  12/06/20 118/80  08/30/20 118/82   Wt Readings from Last 3 Encounters:  03/14/21 213 lb 3.2 oz (96.7 kg)  12/06/20 216 lb (98 kg)  08/30/20 213 lb 3.2 oz (96.7 kg)    Physical Exam Vitals reviewed.  Constitutional:      Appearance: He is well-developed.  Cardiovascular:     Rate and Rhythm: Regular rhythm.     Heart sounds: Normal heart sounds.  Pulmonary:     Effort: Pulmonary effort is normal. No respiratory distress.     Breath sounds: Normal breath sounds. No wheezing, rhonchi or  rales.  Skin:    General: Skin is warm and dry.  Neurological:     Mental Status: He is alert.  Psychiatric:        Speech: Speech normal.        Behavior: Behavior normal.        Assessment & Plan:   Problem List Items Addressed This Visit      Cardiovascular and Mediastinum   HTN (hypertension) - Primary    Chronic, stable. Continue amlodipine 5mg , propranolol 40mg        Relevant Orders   Comprehensive metabolic panel     Other   Chronic left-sided low back pain without sciatica    Chronic stable. Declines further evaluation at this time. Continue tramadol 50mg  q8 hours, mobic 7.5mg  qd       Other Visit Diagnoses    Screening for prostate cancer       Relevant Orders   PSA   Need for prophylactic vaccination against human papillomavirus       Relevant Orders   HPV 9-valent vaccine,Recombinat (Completed)     HPV vaccine- given 2/3 today; Schedule HPV 9 in 6 months to complete series  I am having Jason Hooper. Codd "[name]" maintain his omeprazole, imiquimod, montelukast, sildenafil, amLODipine, propranolol, cyclobenzaprine, traMADol, and meloxicam.   No orders of the defined types were placed in this encounter.   Return precautions given.   Risks, benefits, and alternatives of the medications and treatment plan prescribed today were discussed, and patient expressed understanding.   Education regarding symptom management and diagnosis given to patient on AVS.  Continue to follow with Burnard Hawthorne, FNP for routine health maintenance.   Jason Hooper and I agreed with plan.   Jason Paris, FNP

## 2021-03-17 LAB — COMPREHENSIVE METABOLIC PANEL
ALT: 27 U/L (ref 0–53)
AST: 21 U/L (ref 0–37)
Albumin: 4.9 g/dL (ref 3.5–5.2)
Alkaline Phosphatase: 66 U/L (ref 39–117)
BUN: 17 mg/dL (ref 6–23)
CO2: 27 mEq/L (ref 19–32)
Calcium: 10.3 mg/dL (ref 8.4–10.5)
Chloride: 101 mEq/L (ref 96–112)
Creatinine, Ser: 0.98 mg/dL (ref 0.40–1.50)
GFR: 86.6 mL/min (ref >60.00)
Glucose, Bld: 101 mg/dL — ABNORMAL HIGH (ref 70–99)
Potassium: 4.9 mEq/L (ref 3.5–5.1)
Sodium: 139 mEq/L (ref 135–145)
Total Bilirubin: 0.4 mg/dL (ref 0.2–1.2)
Total Protein: 7.5 g/dL (ref 6.0–8.3)

## 2021-03-17 NOTE — Assessment & Plan Note (Signed)
Chronic, stable. Continue amlodipine 5mg , propranolol 40mg 

## 2021-03-17 NOTE — Assessment & Plan Note (Signed)
Chronic stable. Declines further evaluation at this time. Continue tramadol 50mg  q8 hours, mobic 7.5mg  qd

## 2021-03-31 ENCOUNTER — Telehealth: Payer: Self-pay | Admitting: Family

## 2021-03-31 ENCOUNTER — Encounter: Payer: Self-pay | Admitting: Family

## 2021-03-31 ENCOUNTER — Telehealth: Payer: BC Managed Care – PPO | Admitting: Physician Assistant

## 2021-03-31 DIAGNOSIS — J069 Acute upper respiratory infection, unspecified: Secondary | ICD-10-CM | POA: Diagnosis not present

## 2021-03-31 MED ORDER — AZELASTINE HCL 0.1 % NA SOLN: 1.0000 | Freq: Two times a day (BID) | NASAL | 0 refills | Status: DC

## 2021-03-31 MED ORDER — BENZONATATE 100 MG PO CAPS: 100.0000 mg | ORAL_CAPSULE | Freq: Three times a day (TID) | ORAL | 0 refills | Status: DC | PRN

## 2021-03-31 NOTE — Telephone Encounter (Signed)
I called patient & he stated that he had a bad cold that he felt was settling in chest with congestion. I advised UC or the evisit option to see if he could be prescribed according & possibly something for cough. Patient will try mychart evisit. Patient tested for Covid & was negative.

## 2021-03-31 NOTE — Progress Notes (Signed)
I have spent 5 minutes in review of e-visit questionnaire, review and updating patient chart, medical decision making and response to patient.   Erroll Wilbourne Cody Kea Callan, PA-C    

## 2021-03-31 NOTE — Progress Notes (Signed)
We are sorry you are not feeling well.  Here is how we plan to help!  Based on what you have shared with me, it looks like you may have a viral upper respiratory infection.  Upper respiratory infections are caused by a large number of viruses; however, rhinovirus is the most common cause.   Symptoms vary from person to person, with common symptoms including sore throat, cough, fatigue or lack of energy and feeling of general discomfort.  A low-grade fever of up to 100.4 may present, but is often uncommon.  Symptoms vary however, and are closely related to a person's age or underlying illnesses.  The most common symptoms associated with an upper respiratory infection are nasal discharge or congestion, cough, sneezing, headache and pressure in the ears and face.  These symptoms usually persist for about 3 to 10 days, but can last up to 2 weeks.  It is important to know that upper respiratory infections do not cause serious illness or complications in most cases.    Upper respiratory infections can be transmitted from person to person, with the most common method of transmission being a person's hands.  The virus is able to live on the skin and can infect other persons for up to 2 hours after direct contact.  Also, these can be transmitted when someone coughs or sneezes; thus, it is important to cover the mouth to reduce this risk.  To keep the spread of the illness at [place], good hand hygiene is very important.  This is an infection that is most likely caused by a virus. There are no specific treatments other than to help you with the symptoms until the infection runs its course.  We are sorry you are not feeling well.  Here is how we plan to help!   For nasal congestion, you may use an oral decongestants such as Mucinex D or if you have glaucoma or high blood pressure use plain Mucinex.  Saline nasal spray or nasal drops can help and can safely be used as often as needed for congestion.  For your congestion,  I have prescribed Azelastine nasal spray two sprays in each nostril twice a day  If you do not have a history of heart disease, hypertension, diabetes or thyroid disease, prostate/bladder issues or glaucoma, you may also use Sudafed to treat nasal congestion.  It is highly recommended that you consult with a pharmacist or your primary care physician to ensure this medication is safe for you to take.     If you have a cough, you may use cough suppressants such as Delsym and Robitussin.  If you have glaucoma or high blood pressure, you can also use Coricidin HBP.   For cough I have prescribed for you A prescription cough medication called Tessalon Perles 100 mg. You may take 1-2 capsules every 8 hours as needed for cough  If you have a sore or scratchy throat, use a saltwater gargle-  to  teaspoon of salt dissolved in a 4-ounce to 8-ounce glass of warm water.  Gargle the solution for approximately 15-30 seconds and then spit.  It is important not to swallow the solution.  You can also use throat lozenges/cough drops and Chloraseptic spray to help with throat pain or discomfort.  Warm or cold liquids can also be helpful in relieving throat pain.  For headache, pain or general discomfort, you can use Ibuprofen or Tylenol as directed.   Some authorities believe that zinc sprays or the use of  Echinacea may shorten the course of your symptoms.   HOME CARE Only take medications as instructed by your medical team. Be sure to drink plenty of fluids. Water is fine as well as fruit juices, sodas and electrolyte beverages. You may want to stay away from caffeine or alcohol. If you are nauseated, try taking small sips of liquids. How do you know if you are getting enough fluid? Your urine should be a pale yellow or almost colorless. Get rest. Taking a steamy shower or using a humidifier may help nasal congestion and ease sore throat pain. You can place a towel over your head and breathe in the steam from hot  water coming from a faucet. Using a saline nasal spray works much the same way. Cough drops, hard candies and sore throat lozenges may ease your cough. Avoid close contacts especially the very young and the elderly Cover your mouth if you cough or sneeze Always remember to wash your hands.   GET HELP RIGHT AWAY IF: You develop worsening fever. If your symptoms do not improve within 10 days You develop yellow or green discharge from your nose over 3 days. You have coughing fits You develop a severe head ache or visual changes. You develop shortness of breath, difficulty breathing or start having chest pain Your symptoms persist after you have completed your treatment plan  MAKE SURE YOU  Understand these instructions. Will watch your condition. Will get help right away if you are not doing well or get worse.  Your e-visit answers were reviewed by a board certified advanced clinical practitioner to complete your personal care plan. Depending upon the condition, your plan could have included both over the counter or prescription medications. Please review your pharmacy choice. If there is a problem, you may call our nursing hot line at and have the prescription routed to another pharmacy. Your safety is important to [place]. If you have drug allergies check your prescription carefully.   You can use MyChart to ask questions about today's visit, request a non-urgent call back, or ask for a work or school excuse for 24 hours related to this e-Visit. If it has been greater than 24 hours you will need to follow up with your provider, or enter a new e-Visit to address those concerns. You will get an e-mail in the next two days asking about your experience.  I hope that your e-visit has been valuable and will speed your recovery. Thank you for using e-visits.

## 2021-03-31 NOTE — Telephone Encounter (Signed)
PT called to advise that they have a cold that has gone down into their chest and they are wondering if they can get some cough meds prescribed.

## 2021-04-01 NOTE — Telephone Encounter (Signed)
Pt called to give update in evisit. He stated that he was given nasal spray as well as something for cough. I advised that no antibiotic was prescribed due to duration of symptoms on;ly being around 5 days. I advised that if no better & congestion continues or changes color to let us know we could get him scheduled for VV. Pt stated that he would let us know if he does not improve beginning of next week.

## 2021-04-01 NOTE — Telephone Encounter (Signed)
Pt called and wanted to update you from his telehealth visit. Please call pt

## 2021-04-03 ENCOUNTER — Encounter: Payer: Self-pay | Admitting: Family

## 2021-04-03 ENCOUNTER — Other Ambulatory Visit: Payer: Self-pay | Admitting: Family

## 2021-04-03 ENCOUNTER — Telehealth: Payer: Self-pay | Admitting: Family

## 2021-04-03 DIAGNOSIS — S2242XS Multiple fractures of ribs, left side, sequela: Secondary | ICD-10-CM

## 2021-04-03 DIAGNOSIS — S022XXS Fracture of nasal bones, sequela: Secondary | ICD-10-CM

## 2021-04-03 DIAGNOSIS — S0231XS Fracture of orbital floor, right side, sequela: Secondary | ICD-10-CM

## 2021-04-03 NOTE — Telephone Encounter (Signed)
I spoke with patient & let him know I had sent refill to Dr. Derrel Nip.

## 2021-04-03 NOTE — Telephone Encounter (Signed)
PT called to advise they are returning the missed call from [place].

## 2021-04-22 ENCOUNTER — Other Ambulatory Visit: Payer: Self-pay | Admitting: Family

## 2021-05-01 ENCOUNTER — Other Ambulatory Visit: Payer: Self-pay | Admitting: Internal Medicine

## 2021-05-01 ENCOUNTER — Other Ambulatory Visit: Payer: Self-pay | Admitting: Family

## 2021-05-01 DIAGNOSIS — S2242XS Multiple fractures of ribs, left side, sequela: Secondary | ICD-10-CM

## 2021-05-01 DIAGNOSIS — S022XXS Fracture of nasal bones, sequela: Secondary | ICD-10-CM

## 2021-05-01 DIAGNOSIS — M545 Low back pain, unspecified: Secondary | ICD-10-CM

## 2021-05-01 DIAGNOSIS — S0231XS Fracture of orbital floor, right side, sequela: Secondary | ICD-10-CM

## 2021-05-01 DIAGNOSIS — G8929 Other chronic pain: Secondary | ICD-10-CM

## 2021-05-02 NOTE — Telephone Encounter (Signed)
Pt called to follow up on refill request  °

## 2021-05-07 NOTE — Telephone Encounter (Signed)
Called and spoke to [place]. Deiondre confirmed that he has received his medication.

## 2021-06-29 ENCOUNTER — Other Ambulatory Visit: Payer: Self-pay | Admitting: Family

## 2021-06-29 DIAGNOSIS — S0231XS Fracture of orbital floor, right side, sequela: Secondary | ICD-10-CM

## 2021-06-29 DIAGNOSIS — S022XXS Fracture of nasal bones, sequela: Secondary | ICD-10-CM

## 2021-06-29 DIAGNOSIS — S2242XS Multiple fractures of ribs, left side, sequela: Secondary | ICD-10-CM

## 2021-06-30 NOTE — Telephone Encounter (Signed)
RX Refill:tramadol Last Seen:03-14-21 Last ordered:05-02-22

## 2021-07-20 ENCOUNTER — Other Ambulatory Visit: Payer: Self-pay | Admitting: Family

## 2021-07-20 DIAGNOSIS — I10 Essential (primary) hypertension: Secondary | ICD-10-CM

## 2021-07-28 ENCOUNTER — Other Ambulatory Visit: Payer: Self-pay | Admitting: Family

## 2021-07-28 DIAGNOSIS — M545 Low back pain, unspecified: Secondary | ICD-10-CM

## 2021-08-07 DIAGNOSIS — H43813 Vitreous degeneration, bilateral: Secondary | ICD-10-CM | POA: Diagnosis not present

## 2021-08-21 DIAGNOSIS — H52223 Regular astigmatism, bilateral: Secondary | ICD-10-CM | POA: Diagnosis not present

## 2021-08-21 DIAGNOSIS — H524 Presbyopia: Secondary | ICD-10-CM | POA: Diagnosis not present

## 2021-08-21 DIAGNOSIS — H5213 Myopia, bilateral: Secondary | ICD-10-CM | POA: Diagnosis not present

## 2021-08-21 DIAGNOSIS — H43813 Vitreous degeneration, bilateral: Secondary | ICD-10-CM | POA: Diagnosis not present

## 2021-08-23 ENCOUNTER — Other Ambulatory Visit: Payer: Self-pay | Admitting: Family

## 2021-08-23 DIAGNOSIS — M545 Low back pain, unspecified: Secondary | ICD-10-CM

## 2021-08-23 DIAGNOSIS — G8929 Other chronic pain: Secondary | ICD-10-CM

## 2021-09-23 ENCOUNTER — Telehealth (INDEPENDENT_AMBULATORY_CARE_PROVIDER_SITE_OTHER): Payer: BC Managed Care – PPO | Admitting: Family

## 2021-09-23 ENCOUNTER — Encounter: Payer: Self-pay | Admitting: Family

## 2021-09-23 VITALS — Ht 73.0 in | Wt 213.0 lb

## 2021-09-23 DIAGNOSIS — S0231XS Fracture of orbital floor, right side, sequela: Secondary | ICD-10-CM | POA: Diagnosis not present

## 2021-09-23 DIAGNOSIS — S022XXS Fracture of nasal bones, sequela: Secondary | ICD-10-CM

## 2021-09-23 DIAGNOSIS — U071 COVID-19: Secondary | ICD-10-CM | POA: Diagnosis not present

## 2021-09-23 DIAGNOSIS — S2242XS Multiple fractures of ribs, left side, sequela: Secondary | ICD-10-CM | POA: Diagnosis not present

## 2021-09-23 MED ORDER — TRAMADOL HCL 50 MG PO TABS: ORAL_TABLET | ORAL | 2 refills | Status: DC

## 2021-09-23 MED ORDER — MOLNUPIRAVIR EUA 200MG CAPSULE: 4.0000 | ORAL_CAPSULE | Freq: Two times a day (BID) | ORAL | 0 refills | Status: AC

## 2021-09-23 NOTE — Patient Instructions (Addendum)
It is imperative that you are seen AT least twice per year for labs and monitoring. Monitor blood pressure at home and me 5-6 reading on separate days. Goal is less than 120/80, based on newest guidelines, however we certainly want to be less than 130/80;  if persistently higher, please make sooner follow up appointment so we can recheck you blood pressure and manage/ adjust medications.  Avoid  Cough medications:  Delsym Mucinex DM ( not mucinex D) Cloricidin HBP   We discussed starting Molnupiravir which is an unapproved drug that is authorized for use under an Emergency Use Authorization.  There are no adequate, approved, available products for the treatment of COVID-19 in adults who have mild-to-moderate COVID-19 and are at high risk for progressing to severe COVID-19, including hospitalization or death.  I have sent  Molnupiravir to your pharmacy. Please call pharmacy so they bring medication out to your car and you do not have to go inside.   This medication is not recommended in pregnancy.    COMMON SIDE EFFECTS: Diarrhea Nausea dizziness   If your COVID-19 symptoms get worse, get medical help right away. Call 911 if you experience symptoms such as worsening cough, trouble breathing, chest pain that doesn't go away, confusion, a hard time staying awake, and pale or blue-colored skin.This medication won't prevent all COVID-19 cases from getting worse.   Molnupiravir Oral Capsules What is this medication? MOLNUPIRAVIR (mol nue pir a vir) treats COVID-19. It is an antiviral medication. It may decrease the risk of developing severe symptoms of COVID-19. It may also decrease the chance of going to the hospital. This medication is not approved by the FDA. The FDA has authorized emergency use of thismedication during the COVID-19 pandemic. This medicine may be used for other purposes; ask your health care provider orpharmacist if you have questions. What should I tell my care team before I  take this medication? They need to know if you have any of these conditions: Any allergies Any serious illness An unusual or allergic reaction to molnupiravir, other medications, foods, dyes, or preservatives Pregnant or trying to get pregnant Breast-feeding How should I use this medication? Take this medication by mouth with water. Take it as directed on the prescription label at the same time every day. Do not cut, crush or chew this medication. Swallow the capsules whole. You can take it with or without food. If it upsets your stomach, take it with food. Take all of this medication unless your care team tells you to stop it early. Keep taking it even if youthink you are better. Talk to your care team about the use of this medication in children. Specialcare may be needed. Overdosage: If you think you have taken too much of this medicine contact apoison control center or emergency room at once. NOTE: This medicine is only for you. Do not share this medicine with others. What if I miss a dose? If you miss a dose, take it as soon as you can unless it is more than 10 hours late. If it is more than 10 hours late, skip the missed dose. Take the next dose at the normal time. Do not take extra or 2 doses at the same time to makeup for the missed dose. What may interact with this medication? Interactions have not been studied. This list may not describe all possible interactions. Give your health care provider a list of all the medicines, herbs, non-prescription drugs, or dietary supplements you use. Also tell them  if you smoke, drink alcohol, or use illegaldrugs. Some items may interact with your medicine. What should I watch for while using this medication? Your condition will be monitored carefully while you are receiving this medication. Visit your care team for regular checkups. Tell your care team ifyour symptoms do not start to get better or if they get worse. Do not become pregnant while taking  this medication. You may need a pregnancy test before starting this medication. Women must use a reliable form of birth control while taking this medication and for 4 days after stopping the medication. Women should inform their care team if they wish to become pregnant or think they might be pregnant. Men should not father a child while taking this medication and for 3 months after stopping it. There is potential for serious harm to an unborn child. Talk to your care team for more information. Do not breast-feed an infant while taking this medication and for 4 days afterstopping the medication. What side effects may I notice from receiving this medication? Side effects that you should report to your care team as soon as possible: Allergic reactions-skin rash, itching, hives, swelling of the face, lips, tongue, or throat Side effects that usually do not require medical attention (report these toyour care team if they continue or are bothersome): Diarrhea Dizziness Nausea This list may not describe all possible side effects. Call your doctor for medical advice about side effects. You may report side effects to FDA at1-800-FDA-1088. Where should I keep my medication? Keep out of the reach of children and pets. Store at room temperature between 20 and 25 degrees C (68 and 77 degrees F).Get rid of any unused medication after the expiration date. To get rid of medications that are no longer needed or have expired: Take the medication to a medication take-back program. Check with your pharmacy or law enforcement to find a location. If you cannot return the medication, check the label or package insert to see if the medication should be thrown out in the garbage or flushed down the toilet. If you are not sure, ask your care team. If it is safe to put it in the trash, take the medication out of the container. Mix the medication with cat litter, dirt, coffee grounds, or other unwanted substance. Seal the  mixture in a bag or container. Put it in the trash. NOTE: This sheet is a summary. It may not cover all possible information. If you have questions about this medicine, talk to your doctor, pharmacist, orhealth care provider.  2022 Elsevier/Gold Standard (2020-10-07 16:16:01)

## 2021-09-23 NOTE — Progress Notes (Signed)
Virtual Visit via Video Note  I connected with@  on 09/23/21 at  9:00 AM EST by a video enabled telemedicine application and verified that I am speaking with the correct person using two identifiers.  Location patient: home Location provider:work  Persons participating in the virtual visit: patient, provider  I discussed the limitations of evaluation and management by telemedicine and the availability of in person appointments. The patient expressed understanding and agreed to proceed.   HPI: Complains of dry cough x 3 days ago, worsening.  Endorses nasal congestion, dull HA, chills, bodyaches  Tmax 99.3  COVID positive yesterday  No cp, sob, wheezing  Taking DayQuil and NyQuil with relief.  Uses snuff.  No h/o lung disease  H/o htn, CLBP  Covid vaccine and no booster   Chronic low back pain remains well controlled.  He request refill of tramadol  ROS: See pertinent positives and negatives per HPI.    EXAM:  VITALS per patient if applicable: Ht 6' 1"  (1.854 m)   Wt 213 lb (96.6 kg)   BMI 28.10 kg/m  BP Readings from Last 3 Encounters:  03/14/21 118/80  12/06/20 118/80  08/30/20 118/82   Wt Readings from Last 3 Encounters:  09/23/21 213 lb (96.6 kg)  03/14/21 213 lb 3.2 oz (96.7 kg)  12/06/20 216 lb (98 kg)    GENERAL: alert, oriented, appears well and in no acute distress  HEENT: atraumatic, conjunttiva clear, no obvious abnormalities on inspection of external nose and ears  NECK: normal movements of the head and neck  LUNGS: on inspection no signs of respiratory distress, breathing rate appears normal, no obvious gross SOB, gasping or wheezing  CV: no obvious cyanosis  MS: moves all visible extremities without noticeable abnormality  PSYCH/NEURO: pleasant and cooperative, no obvious depression or anxiety, speech and thought processing grossly intact  ASSESSMENT AND PLAN:  Discussed the following assessment and plan:  Problem List Items  Addressed This Visit       Musculoskeletal and Integument   Closed fracture of nasal bones   Relevant Medications   traMADol (ULTRAM) 50 MG tablet   Closed fracture of right orbital floor (HCC)   Relevant Medications   traMADol (ULTRAM) 50 MG tablet   Multiple closed fractures of ribs of left side   Relevant Medications   traMADol (ULTRAM) 50 MG tablet     Other   COVID-19 - Primary    No acute respiratory distress.  Discussed molnupiravir.I have counseled on lacking long term safely and effectiveness data of medication,molnupiravir.  Explained EUA for molnupiravir. Criteria met for consideration of  Molnupiravir :  covid positive, patient older than 56 years old, started within 5 days of symptom onset and risk factor for severe disease include: HTN ( on amlodipine)  Vaccine status: vaccinated without booster  Counseled on adverse effects including nausea, dizziness, diarrhea.  Patient is most comfortable and desires to start [place] .        Relevant Medications   molnupiravir EUA (LAGEVRIO) 200 mg CAPS capsule    -we discussed possible serious and likely etiologies, options for evaluation and workup, limitations of telemedicine visit vs in person visit, treatment, treatment risks and precautions. Pt prefers to treat via telemedicine empirically rather then risking or undertaking an in person visit at this moment.  .   I discussed the assessment and treatment plan with the patient. The patient was provided an opportunity to ask questions and all were answered. The patient agreed with the plan and  demonstrated an understanding of the instructions.   The patient was advised to call back or seek an in-person evaluation if the symptoms worsen or if the condition fails to improve as anticipated.   [name], FNP

## 2021-09-23 NOTE — Assessment & Plan Note (Signed)
No acute respiratory distress.  Discussed molnupiravir.I have counseled on lacking long term safely and effectiveness data of medication,molnupiravir.  Explained EUA for molnupiravir. Criteria met for consideration of  Molnupiravir :  covid positive, patient older than 56 years old, started within 5 days of symptom onset and risk factor for severe disease include: HTN ( on amlodipine)  Vaccine status: vaccinated without booster  Counseled on adverse effects including nausea, dizziness, diarrhea.  Patient is most comfortable and desires to start [place] .

## 2021-10-24 ENCOUNTER — Other Ambulatory Visit: Payer: Self-pay | Admitting: Family

## 2021-10-24 DIAGNOSIS — I10 Essential (primary) hypertension: Secondary | ICD-10-CM

## 2021-10-25 ENCOUNTER — Other Ambulatory Visit: Payer: Self-pay | Admitting: Family

## 2021-10-25 DIAGNOSIS — M545 Low back pain, unspecified: Secondary | ICD-10-CM

## 2021-10-25 DIAGNOSIS — G8929 Other chronic pain: Secondary | ICD-10-CM

## 2021-12-20 ENCOUNTER — Other Ambulatory Visit: Payer: Self-pay | Admitting: Family

## 2021-12-20 DIAGNOSIS — S2242XS Multiple fractures of ribs, left side, sequela: Secondary | ICD-10-CM

## 2021-12-20 DIAGNOSIS — S022XXS Fracture of nasal bones, sequela: Secondary | ICD-10-CM

## 2021-12-20 DIAGNOSIS — S0231XS Fracture of orbital floor, right side, sequela: Secondary | ICD-10-CM

## 2021-12-22 ENCOUNTER — Telehealth: Payer: Self-pay | Admitting: Family

## 2021-12-22 DIAGNOSIS — S0231XS Fracture of orbital floor, right side, sequela: Secondary | ICD-10-CM

## 2021-12-22 DIAGNOSIS — S2242XS Multiple fractures of ribs, left side, sequela: Secondary | ICD-10-CM

## 2021-12-22 DIAGNOSIS — S022XXS Fracture of nasal bones, sequela: Secondary | ICD-10-CM

## 2021-12-22 NOTE — Telephone Encounter (Signed)
Pt called wanting to get a partial refill on his tramadol since he is scheduled for an office visit on 3/24 ?

## 2021-12-22 NOTE — Telephone Encounter (Signed)
Call pt ?Please sch f/u visit for refill of tramadol ?Last seen 6/22 and cannot refill until visit ? ?Advise he can do virtual this time if needed due to work however he will need to alternate with in person and virtual with a controlled substance ? ? ?

## 2021-12-24 ENCOUNTER — Telehealth: Payer: Self-pay

## 2021-12-24 NOTE — Telephone Encounter (Signed)
LMTCB to schedule appt for medication refill ?

## 2021-12-24 NOTE — Telephone Encounter (Signed)
Patient returned office phone call. He would like to know if he can get enough tramadol until his appointment on 01/02/2022 with [facility]. Could not schedule patient any sooner. ?

## 2021-12-24 NOTE — Telephone Encounter (Signed)
Spoke to patient about him coming in to get partial refill until appointment on the 24th of March, but was unable to accomobeing conflicting with ours.date due to his schedule ?

## 2021-12-24 NOTE — Telephone Encounter (Signed)
LMTCB to schedule appointment for medication refill ?

## 2021-12-26 ENCOUNTER — Encounter: Payer: Self-pay | Admitting: Family

## 2021-12-26 MED ORDER — TRAMADOL HCL 50 MG PO TABS: ORAL_TABLET | ORAL | 0 refills | Status: DC

## 2022-01-02 ENCOUNTER — Telehealth: Payer: Self-pay | Admitting: Family

## 2022-01-02 ENCOUNTER — Other Ambulatory Visit: Payer: Self-pay

## 2022-01-02 ENCOUNTER — Encounter: Payer: Self-pay | Admitting: Family

## 2022-01-02 ENCOUNTER — Ambulatory Visit (INDEPENDENT_AMBULATORY_CARE_PROVIDER_SITE_OTHER): Payer: Self-pay | Admitting: Family

## 2022-01-02 VITALS — BP 120/82 | HR 67 | Temp 98.6°F | Ht 73.0 in | Wt 214.4 lb

## 2022-01-02 DIAGNOSIS — S022XXS Fracture of nasal bones, sequela: Secondary | ICD-10-CM

## 2022-01-02 DIAGNOSIS — G8929 Other chronic pain: Secondary | ICD-10-CM

## 2022-01-02 DIAGNOSIS — Z23 Encounter for immunization: Secondary | ICD-10-CM

## 2022-01-02 DIAGNOSIS — L918 Other hypertrophic disorders of the skin: Secondary | ICD-10-CM

## 2022-01-02 DIAGNOSIS — S0231XS Fracture of orbital floor, right side, sequela: Secondary | ICD-10-CM

## 2022-01-02 DIAGNOSIS — S2242XS Multiple fractures of ribs, left side, sequela: Secondary | ICD-10-CM

## 2022-01-02 DIAGNOSIS — I10 Essential (primary) hypertension: Secondary | ICD-10-CM

## 2022-01-02 DIAGNOSIS — M545 Low back pain, unspecified: Secondary | ICD-10-CM

## 2022-01-02 DIAGNOSIS — K219 Gastro-esophageal reflux disease without esophagitis: Secondary | ICD-10-CM

## 2022-01-02 MED ORDER — TRAMADOL HCL 50 MG PO TABS: ORAL_TABLET | ORAL | 2 refills | Status: DC

## 2022-01-02 MED ORDER — OMEPRAZOLE 20 MG PO CPDR: 20.0000 mg | DELAYED_RELEASE_CAPSULE | Freq: Every day | ORAL | 1 refills | Status: DC

## 2022-01-02 MED ORDER — OMEPRAZOLE 20 MG PO CPDR: 20.0000 mg | DELAYED_RELEASE_CAPSULE | Freq: Every day | ORAL | 2 refills | Status: DC

## 2022-01-02 NOTE — Assessment & Plan Note (Addendum)
Chronic, stable.   History of esophageal stricture.  EGD 2019 showed evidence of gastritis.Continue Prilosec 20 mg qd, especially while on NSAID ( mobic) ?

## 2022-01-02 NOTE — Progress Notes (Signed)
? ?Subjective:  ? ? [name], male    DOB: Nov 09, 1965, 56 y.o.   MRN: [id number] ? ?CC: Jason Hooper is a 56 y.o. male who presents today for follow up.  ? ?HPI: Feels well today  ? ?Complains of bothersome skin tags around nape of his neck.  No history of skin cancer.  Previously had been following Dr. Bea Laura ? ?GERD-compliant with Prilosec 20 mg daily.  If he stops medication he will have epigastric burning after meals. ? ?Hypertension-compliant with amlodipine 5 mg, propranolol 40 mg daily. No cp.  ?Chronic low back pain-worse after long periods of standing.  Improved with exercise, walking .compliant with tramadol 50 mg as needed every 8 hours, mobic 7.'5mg'$  qd.  Continues to have left low back pain which is been more bothersome of late.  No numbness, lower extremity weakness, saddle anesthesia, trouble urinating. ? ?HISTORY:  ?Past Medical History:  ?Diagnosis Date  ? Alcohol abuse   ? Allergy   ? Bipolar disorder ([place])   ? GERD (gastroesophageal reflux disease)   ? Kidney stones 2014  ? ?Past Surgical History:  ?Procedure Laterality Date  ? COLONOSCOPY WITH PROPOFOL N/A 01/24/2018  ? Procedure: COLONOSCOPY WITH PROPOFOL;  Surgeon: [name], MD;  Location: [facility] ENDOSCOPY;  Service: Gastroenterology;  Laterality: N/A;  ? ESOPHAGEAL DILATION    ? ESOPHAGOGASTRODUODENOSCOPY (EGD) WITH PROPOFOL N/A 01/24/2018  ? Procedure: ESOPHAGOGASTRODUODENOSCOPY (EGD) WITH PROPOFOL;  Surgeon: [name], MD;  Location: [facility] ENDOSCOPY;  Service: Gastroenterology;  Laterality: N/A;  ? JOINT REPLACEMENT Right 07/16/2017  ? R great toe  ? TONSILECTOMY/ADENOIDECTOMY WITH MYRINGOTOMY    ? ?Family History  ?Adopted: Yes  ?Family history unknown: Yes  ? ? ?Allergies: Patient has no known allergies. ?Current Outpatient Medications on File Prior to Visit  ?Medication Sig Dispense Refill  ? amLODipine (NORVASC) 5 MG tablet TAKE 1 TABLET BY MOUTH EVERY DAY 90 tablet 2  ? cyclobenzaprine (FLEXERIL) 10 MG tablet TAKE 1  TABLET BY MOUTH AT BEDTIME AS NEEDED FOR MUSCLE SPASMS. 30 tablet 2  ? meloxicam (MOBIC) 7.5 MG tablet TAKE 1 TABLET BY MOUTH EVERY DAY AS NEEDED FOR PAIN 90 tablet 1  ? montelukast (SINGULAIR) 10 MG tablet TAKE 1 TABLET BY MOUTH EVERYDAY AT BEDTIME 90 tablet 3  ? propranolol (INDERAL) 40 MG tablet TAKE 1 TABLET BY MOUTH TWICE A DAY 180 tablet 1  ? sildenafil (VIAGRA) 100 MG tablet TAKE 1 TABLET BY MOUTH ONCE DAILY AS NEEDED 30 tablet 3  ? ?No current facility-administered medications on file prior to visit.  ? ? ?Social History  ? ?Tobacco Use  ? Smoking status: Never  ? Smokeless tobacco: Former  ?  Types: Snuff  ?  Quit date: 04/26/2014  ?Vaping Use  ? Vaping Use: Never used  ?Substance Use Topics  ? Alcohol use: Yes  ?  Alcohol/week: 1.0 - 2.0 standard drink  ?  Types: 1 - 2 Cans of beer per week  ?  Comment: occasional  ? Drug use: No  ? ? ?Review of Systems  ?Constitutional:  Negative for chills and fever.  ?Respiratory:  Negative for cough.   ?Cardiovascular:  Negative for chest pain and palpitations.  ?Gastrointestinal:  Negative for nausea and vomiting.  ?Musculoskeletal:  Positive for back pain.  ?Neurological:  Negative for numbness.  ?   ?Objective:  ?  ?BP 120/82 (BP Location: Left Arm, Patient Position: Sitting)   Pulse 67   Temp 98.6 ?F (37 ?C) (Oral)  Ht '6\' 1"'$  (1.854 m)   Wt 214 lb 6.4 oz (97.3 kg)   SpO2 98%   BMI 28.29 kg/m?  ?BP Readings from Last 3 Encounters:  ?01/02/22 120/82  ?03/14/21 118/80  ?12/06/20 118/80  ? ?Wt Readings from Last 3 Encounters:  ?01/02/22 214 lb 6.4 oz (97.3 kg)  ?09/23/21 213 lb (96.6 kg)  ?03/14/21 213 lb 3.2 oz (96.7 kg)  ? ? ?Physical Exam ?Vitals reviewed.  ?Constitutional:   ?   Appearance: He is well-developed.  ?Cardiovascular:  ?   Rate and Rhythm: Regular rhythm.  ?   Heart sounds: Normal heart sounds.  ?Pulmonary:  ?   Effort: Pulmonary effort is normal. No respiratory distress.  ?   Breath sounds: Normal breath sounds. No wheezing, rhonchi or rales.   ?Skin: ?   General: Skin is warm and dry.  ?Neurological:  ?   Mental Status: He is alert.  ?Psychiatric:     ?   Speech: Speech normal.     ?   Behavior: Behavior normal.  ? ? ?   ?Assessment & Plan:  ? ?Problem List Items Addressed This Visit   ? ?  ? Cardiovascular and Mediastinum  ? HTN (hypertension)  ?  Chronic stable. Continue amlodipine 5 mg, propranolol 40 mg daily. ?  ?  ?  ? Digestive  ? GERD (gastroesophageal reflux disease)  ?  Chronic, stable.   History of esophageal stricture.  EGD 2019 showed evidence of gastritis.Continue Prilosec 20 mg qd, especially while on NSAID ( mobic) ?  ?  ? Relevant Medications  ? omeprazole (PRILOSEC) 20 MG capsule  ?  ? Musculoskeletal and Integument  ? Closed fracture of nasal bones  ? Closed fracture of right orbital floor [facility])  ? Multiple closed fractures of ribs of left side  ?  ? Other  ? Chronic left-sided low back pain without sciatica - Primary  ?  Chronic overall stable with slight progression.  We agreed consult with pain management, Dr. Holley Raring in regards to further options for intervention.  Continue tramadol 8 mg every 8 hours as needed, meloxicam 7.5 mg daily ?  ?  ? Relevant Medications  ? traMADol (ULTRAM) 50 MG tablet  ? Other Relevant Orders  ? Ambulatory referral to Pain Clinic  ? ?Other Visit Diagnoses   ? ? Skin tag      ? Relevant Orders  ? Ambulatory referral to Dermatology  ? ?  ? ? ? ?I have discontinued [name]. Quevedo "Michael"'s imiquimod and azelastine. I am also having him maintain his sildenafil, montelukast, propranolol, meloxicam, amLODipine, cyclobenzaprine, traMADol, and omeprazole. ? ? ?Meds ordered this encounter  ?Medications  ? traMADol (ULTRAM) 50 MG tablet  ?  Sig: TAKE 1 TABLET BY MOUTH EVERY 8 HOURS AS NEEDED FOR MODERATE PAIN.  ?  Dispense:  90 tablet  ?  Refill:  2  ?  DNF 01/26/22  ?  Order Specific Question:   Supervising Provider  ?  Answer:   Crecencio Mc [[id number]]  ? omeprazole (PRILOSEC) 20 MG capsule  ?  Sig: Take 1  capsule (20 mg total) by mouth daily.  ?  Dispense:  90 capsule  ?  Refill:  2  ?  Order Specific Question:   Supervising Provider  ?  Answer:   Crecencio Mc [[id number]]  ? ? ?Return precautions given.  ? ?Risks, benefits, and alternatives of the medications and treatment plan prescribed today were discussed, and patient expressed  understanding.  ? ?Education regarding symptom management and diagnosis given to patient on AVS. ? ?Continue to follow with Burnard Hawthorne, FNP for routine health maintenance.  ? ?[name] and I agreed with plan.  ? ?[name], FNP ? ? ?

## 2022-01-02 NOTE — Telephone Encounter (Signed)
Call pt ? ?Meant to give pt 3rd HPV vaccine at visit today. Reviewed CDC guidance ? ?Please schedule gardisil 9 nurse visit to complete series 3/3 vaccines ? ?

## 2022-01-02 NOTE — Assessment & Plan Note (Signed)
Chronic overall stable with slight progression.  We agreed consult with pain management, Dr. Holley Raring in regards to further options for intervention.  Continue tramadol 8 mg every 8 hours as needed, meloxicam 7.5 mg daily ?

## 2022-01-02 NOTE — Assessment & Plan Note (Signed)
Chronic stable. Continue amlodipine 5 mg, propranolol 40 mg daily. ?

## 2022-01-02 NOTE — Telephone Encounter (Signed)
Patient came back for HPV vaccine today. ?

## 2022-01-02 NOTE — Addendum Note (Signed)
Addended by: Earlyne Iba on: 01/02/2022 02:49 PM ? ? Modules accepted: Orders ? ?

## 2022-01-21 ENCOUNTER — Other Ambulatory Visit: Payer: Self-pay | Admitting: Family

## 2022-01-21 DIAGNOSIS — I10 Essential (primary) hypertension: Secondary | ICD-10-CM

## 2022-02-21 ENCOUNTER — Other Ambulatory Visit: Payer: Self-pay | Admitting: Family

## 2022-02-21 DIAGNOSIS — G8929 Other chronic pain: Secondary | ICD-10-CM

## 2022-04-25 ENCOUNTER — Other Ambulatory Visit: Payer: Self-pay | Admitting: Family

## 2022-04-25 DIAGNOSIS — G8929 Other chronic pain: Secondary | ICD-10-CM

## 2022-05-22 ENCOUNTER — Other Ambulatory Visit: Payer: Self-pay | Admitting: Family

## 2022-05-22 DIAGNOSIS — N529 Male erectile dysfunction, unspecified: Secondary | ICD-10-CM

## 2022-06-29 ENCOUNTER — Ambulatory Visit: Payer: Self-pay | Admitting: Dermatology

## 2022-07-14 ENCOUNTER — Ambulatory Visit: Payer: Self-pay | Admitting: Student in an Organized Health Care Education/Training Program

## 2022-07-16 ENCOUNTER — Other Ambulatory Visit: Payer: Self-pay | Admitting: Family

## 2022-07-22 ENCOUNTER — Other Ambulatory Visit: Payer: Self-pay | Admitting: Family

## 2022-07-22 DIAGNOSIS — I10 Essential (primary) hypertension: Secondary | ICD-10-CM

## 2022-07-25 ENCOUNTER — Other Ambulatory Visit: Payer: Self-pay | Admitting: Family

## 2022-07-28 ENCOUNTER — Encounter: Payer: Self-pay | Admitting: Family

## 2022-07-28 ENCOUNTER — Other Ambulatory Visit: Payer: Self-pay

## 2022-07-28 DIAGNOSIS — G8929 Other chronic pain: Secondary | ICD-10-CM

## 2022-07-28 MED ORDER — MONTELUKAST SODIUM 10 MG PO TABS: ORAL_TABLET | ORAL | 3 refills | Status: DC

## 2022-07-29 ENCOUNTER — Ambulatory Visit (INDEPENDENT_AMBULATORY_CARE_PROVIDER_SITE_OTHER): Payer: 59 | Admitting: Dermatology

## 2022-07-29 DIAGNOSIS — D229 Melanocytic nevi, unspecified: Secondary | ICD-10-CM | POA: Diagnosis not present

## 2022-07-29 DIAGNOSIS — L918 Other hypertrophic disorders of the skin: Secondary | ICD-10-CM

## 2022-07-29 DIAGNOSIS — L821 Other seborrheic keratosis: Secondary | ICD-10-CM | POA: Diagnosis not present

## 2022-07-29 DIAGNOSIS — L814 Other melanin hyperpigmentation: Secondary | ICD-10-CM

## 2022-07-29 DIAGNOSIS — L720 Epidermal cyst: Secondary | ICD-10-CM

## 2022-07-29 DIAGNOSIS — D224 Melanocytic nevi of scalp and neck: Secondary | ICD-10-CM | POA: Diagnosis not present

## 2022-07-29 DIAGNOSIS — D2372 Other benign neoplasm of skin of left lower limb, including hip: Secondary | ICD-10-CM | POA: Diagnosis not present

## 2022-07-29 DIAGNOSIS — D239 Other benign neoplasm of skin, unspecified: Secondary | ICD-10-CM

## 2022-07-29 DIAGNOSIS — D485 Neoplasm of uncertain behavior of skin: Secondary | ICD-10-CM

## 2022-07-29 HISTORY — DX: Melanocytic nevi, unspecified: D22.9

## 2022-07-29 MED ORDER — TRAMADOL HCL 50 MG PO TABS: ORAL_TABLET | ORAL | 2 refills | Status: DC

## 2022-07-29 NOTE — Patient Instructions (Addendum)
Wound Care Instructions  Cleanse wound gently with soap and water once a day then pat dry with clean gauze. Apply a thin coat of Petrolatum (petroleum jelly, "Vaseline") over the wound (unless you have an allergy to this). We recommend that you use a new, sterile tube of Vaseline. Do not pick or remove scabs. Do not remove the yellow or white "healing tissue" from the base of the wound.  Cover the wound with fresh, clean, nonstick gauze and secure with paper tape. You may use Band-Aids in place of gauze and tape if the wound is small enough, but would recommend trimming much of the tape off as there is often too much. Sometimes Band-Aids can irritate the skin.  You should call the office for your biopsy report after 1 week if you have not already been contacted.  If you experience any problems, such as abnormal amounts of bleeding, swelling, significant bruising, significant pain, or evidence of infection, please call the office immediately.  FOR ADULT SURGERY PATIENTS: If you need something for pain relief you may take 1 extra strength Tylenol (acetaminophen) AND 2 Ibuprofen ('200mg'$  each) together every 4 hours as needed for pain. (do not take these if you are allergic to them or if you have a reason you should not take them.) Typically, you may only need pain medication for 1 to 3 days.     Pre-Operative Instructions  You are scheduled for a surgical procedure at [facility]. We recommend you read the following instructions. If you have any questions or concerns, please call the office at [phone].  Shower and wash the entire body with soap and water the day of your surgery paying special attention to cleansing at and around the planned surgery site.  Avoid aspirin or aspirin containing products at least fourteen (14) days prior to your surgical procedure and for at least one week (7 Days) after your surgical procedure. If you take aspirin on a regular basis for heart disease or  history of stroke or for any other reason, we may recommend you continue taking aspirin but please notify us if you take this on a regular basis. Aspirin can cause more bleeding to occur during surgery as well as prolonged bleeding and bruising after surgery.   Avoid other nonsteroidal pain medications at least one week prior to surgery and at least one week prior to your surgery. These include medications such as Ibuprofen (Motrin, Advil and Nuprin), Naprosyn, Voltaren, Relafen, etc. If medications are used for therapeutic reasons, please inform us as they can cause increased bleeding or prolonged bleeding during and bruising after surgical procedures.   Please advise [place] if you are taking any "blood thinner" medications such as Coumadin or Dipyridamole or Plavix or similar medications. These cause increased bleeding and prolonged bleeding during procedures and bruising after surgical procedures. We may have to consider discontinuing these medications briefly prior to and shortly after your surgery if safe to do so.   Please inform us of all medications you are currently taking. All medications that are taken regularly should be taken the day of surgery as you always do. Nevertheless, we need to be informed of what medications you are taking prior to surgery to know whether they will affect the procedure or cause any complications.   Please inform us of any medication allergies. Also inform us of whether you have allergies to Latex or rubber products or whether you have had any adverse reaction to Lidocaine or Epinephrine.  Please inform  us of any prosthetic or artificial body parts such as artificial heart valve, joint replacements, etc., or similar condition that might require preoperative antibiotics.   We recommend avoidance of alcohol at least two weeks prior to surgery and continued avoidance for at least two weeks after surgery.   We recommend discontinuation of tobacco smoking at least two  weeks prior to surgery and continued abstinence for at least two weeks after surgery.  Do not plan strenuous exercise, strenuous work or strenuous lifting for approximately four weeks after your surgery.   We request if you are unable to make your scheduled surgical appointment, please call us at least a week in advance or as soon as you are aware of a problem so that we can cancel or reschedule the appointment.   You MAY TAKE TYLENOL (acetaminophen) for pain as it is not a blood thinner.   PLEASE PLAN TO BE IN TOWN FOR TWO WEEKS FOLLOWING SURGERY, THIS IS IMPORTANT SO YOU CAN BE CHECKED FOR DRESSING CHANGES, SUTURE REMOVAL AND TO MONITOR FOR POSSIBLE COMPLICATIONS.    Due to recent changes in healthcare laws, you may see results of your pathology and/or laboratory studies on MyChart before the doctors have had a chance to review them. We understand that in some cases there may be results that are confusing or concerning to you. Please understand that not all results are received at the same time and often the doctors may need to interpret multiple results in order to provide you with the best plan of care or course of treatment. Therefore, we ask that you please give [place] 2 business days to thoroughly review all your results before contacting the office for clarification. Should we see a critical lab result, you will be contacted sooner.   If You Need Anything After Your Visit  If you have any questions or concerns for your doctor, please call our main line at [phone] and press option 4 to reach your doctor's medical assistant. If no one answers, please leave a voicemail as directed and we will return your call as soon as possible. Messages left after 4 pm will be answered the following business day.   You may also send [place] a message via [place]. We typically respond to MyChart messages within 1-2 business days.  For prescription refills, please ask your pharmacy to contact our office. Our fax  number is [phone].  If you have an urgent issue when the clinic is closed that cannot wait until the next business day, you can page your doctor at the number below.    Please note that while we do our best to be available for urgent issues outside of office hours, we are not available 24/7.   If you have an urgent issue and are unable to reach [place], you may choose to seek medical care at your doctor's office, retail clinic, urgent care center, or emergency room.  If you have a medical emergency, please immediately call 911 or go to the emergency department.  Pager Numbers  - Dr. Nehemiah Massed: [phone]  - Dr. Laurence Ferrari: [phone]  - Dr. Nicole Kindred: [phone]  In the event of inclement weather, please call our main line at [phone] for an update on the status of any delays or closures.  Dermatology Medication Tips: Please keep the boxes that topical medications come in in order to help keep track of the instructions about where and how to use these. Pharmacies typically print the medication instructions only on the boxes and not  directly on the medication tubes.   If your medication is too expensive, please contact our office at [phone] option 4 or send [place] a message through [place].   We are unable to tell what your co-pay for medications will be in advance as this is different depending on your insurance coverage. However, we may be able to find a substitute medication at lower cost or fill out paperwork to get insurance to cover a needed medication.   If a prior authorization is required to get your medication covered by your insurance company, please allow [place] 1-2 business days to complete this process.  Drug prices often vary depending on where the prescription is filled and some pharmacies may offer cheaper prices.  The website www.goodrx.com contains coupons for medications through different pharmacies. The prices here do not account for what the cost may be with help  from insurance (it may be cheaper with your insurance), but the website can give you the price if you did not use any insurance.  - You can print the associated coupon and take it with your prescription to the pharmacy.  - You may also stop by our office during regular business hours and pick up a GoodRx coupon card.  - If you need your prescription sent electronically to a different pharmacy, notify our office through [facility] or by phone at [phone] option 4.     Si Usted Necesita Algo Despus de Su Visita  Tambin puede enviarnos un mensaje a travs de Pharmacist, community. Por lo general respondemos a los mensajes de MyChart en el transcurso de 1 a 2 das hbiles.  Para renovar recetas, por favor pida a su farmacia que se ponga en contacto con nuestra oficina. Harland Dingwall de fax es [place] [phone].  Si tiene un asunto urgente cuando la clnica est cerrada y que no puede esperar hasta el siguiente da hbil, puede llamar/localizar a su doctor(a) al nmero que aparece a continuacin.   Por favor, tenga en cuenta que aunque hacemos todo lo posible para estar disponibles para asuntos urgentes fuera del horario de [place], no estamos disponibles las 24 horas del da, los 7 das de la [place].   Si tiene un problema urgente y no puede comunicarse con nosotros, puede optar por buscar atencin mdica  en el consultorio de su doctor(a), en una clnica privada, en un centro de atencin urgente o en una sala de emergencias.  Si tiene Engineering geologist, por favor llame inmediatamente al 911 o vaya a la sala de emergencias.  Nmeros de bper  - Dr. Nehemiah Massed: [phone]  - Dra. Moye: [phone]  - Dra. Nicole Kindred: [phone]  En caso de inclemencias del [place], por favor llame a Johnsie Kindred principal al [phone] para una actualizacin sobre el [place] de cualquier retraso o cierre.  Consejos para la medicacin en dermatologa: Por favor, guarde las cajas en las que vienen los  medicamentos de uso tpico para ayudarle a seguir las instrucciones sobre dnde y cmo usarlos. Las farmacias generalmente imprimen las instrucciones del medicamento slo en las cajas y no directamente en los tubos del [place].   Si su medicamento es muy caro, por favor, pngase en contacto con Zigmund Daniel llamando al [phone] y presione la opcin 4 o envenos un mensaje a travs de Pharmacist, community.   No podemos decirle cul ser su copago por los medicamentos por adelantado ya que esto es diferente dependiendo de la cobertura de su seguro. Sin embargo, es posible que podamos encontrar un medicamento  sustituto a Electrical engineer un formulario para que el seguro cubra el medicamento que se considera necesario.   Si se requiere una autorizacin previa para que su compaa de seguros [place] su medicamento, por favor permtanos de 1 a 2 das hbiles para completar este proceso.  Los precios de los medicamentos varan con frecuencia dependiendo del Environmental consultant de dnde se surte la receta y alguna farmacias pueden ofrecer precios ms baratos.  El sitio web www.goodrx.com tiene cupones para medicamentos de Airline pilot. Los precios aqu no tienen en cuenta lo que podra costar con la ayuda del seguro (puede ser ms barato con su seguro), pero el sitio web puede darle el precio si no utiliz Research scientist (physical sciences).  - Puede imprimir el cupn correspondiente y llevarlo con su receta a la farmacia.  - Tambin puede pasar por nuestra oficina durante el horario de atencin regular y Charity fundraiser una tarjeta de cupones de GoodRx.  - Si necesita que su receta se enve electrnicamente a una farmacia diferente, informe a nuestra oficina a travs de MyChart de [place] o por telfono llamando al [phone] y presione la opcin 4.

## 2022-07-29 NOTE — Progress Notes (Signed)
New Patient Visit  Subjective  Jason Hooper is a 56 y.o. male who presents for the following: Skin tags (Upper chest/R shoulder/neck area - irritated, patient would like removed today), Cyst (L shoulder - present for years, does become inflamed occasionally), and Insect bite (L upper thigh x 2 mths - itches, patient concerned and would like lesion checked today). The patient has spots, moles and lesions to be evaluated, some may be new or changing.  The following portions of the chart were reviewed this encounter and updated as appropriate:   Tobacco  Allergies  Meds  Problems  Med Hx  Surg Hx  Fam Hx     Review of Systems:  No other skin or systemic complaints except as noted in HPI or Assessment and Plan.  Objective  Well appearing patient in no apparent distress; mood and affect are within normal limits.  A focused examination was performed including the face, trunk, and extremities. Relevant physical exam findings are noted in the Assessment and Plan.  L med scapula Subcutaneous nodule.   Neck x 15 (15) Fleshy, skin-colored pedunculated papules.    L med thigh Firm pink/brown papulenodule with dimple sign.   R post base of neck 0.7 cm irregular brown macule.    Assessment & Plan  Epidermal inclusion cyst L med scapula Benign-appearing. Exam most consistent with an epidermal inclusion cyst. Discussed that a cyst is a benign growth that can grow over time and sometimes get irritated or inflamed. Recommend observation if it is not bothersome. Discussed option of surgical excision to remove it if it is growing, symptomatic, or other changes noted. Please call for new or changing lesions so they can be evaluated.  Skin tag (15) Neck x 15 Destruction of lesion - Neck x 15 Complexity: simple   Destruction method: cryotherapy   Informed consent: discussed and consent obtained   Timeout:  patient name, date of birth, surgical site, and procedure verified Lesion  destroyed using liquid nitrogen: Yes   Region frozen until ice ball extended beyond lesion: Yes   Outcome: patient tolerated procedure well with no complications   Post-procedure details: wound care instructions given    Dermatofibroma with symptoms of itch L med thigh A dermatofibroma is a benign growth possibly related to trauma, such as an insect bite or inflamed acne-type bump.  Discussed removal (shave vrs excision) with resulting scar and risk of recurrence.    Intralesional injection - L med thigh Location: L med thigh   Informed Consent: Discussed risks (infection, pain, bleeding, bruising, thinning of the skin, loss of skin pigment, lack of resolution, and recurrence of lesion) and benefits of the procedure, as well as the alternatives. Informed consent was obtained. Preparation: The area was prepared a standard fashion.  Procedure Details: An intralesional injection was performed with Kenalog 10 mg/cc. 0.1 cc in total were injected.  Total number of injections: 1  NUU#[id number]  Plan: The patient was instructed on post-op care. Recommend OTC analgesia as needed for pain.  Neoplasm of uncertain behavior of skin R post base of neck Epidermal / dermal shaving  Lesion diameter (cm):  0.7 Informed consent: discussed and consent obtained   Timeout: patient name, date of birth, surgical site, and procedure verified   Procedure prep:  Patient was prepped and draped in usual sterile fashion Prep type:  Isopropyl alcohol Anesthesia: the lesion was anesthetized in a standard fashion   Anesthetic:  1% lidocaine w/ epinephrine 1-100,000 buffered w/ 8.4% NaHCO3 Instrument used:  flexible razor blade   Hemostasis achieved with: pressure, aluminum chloride and electrodesiccation   Outcome: patient tolerated procedure well   Post-procedure details: sterile dressing applied and wound care instructions given   Dressing type: bandage and petrolatum    Specimen 1 - Surgical  pathology Differential Diagnosis: D48.5 r/o dysplastic nevus Check Margins: No  Seborrheic Keratoses - Stuck-on, waxy, tan-brown papules and/or plaques  - Benign-appearing - Discussed benign etiology and prognosis. - Observe - Call for any changes  Melanocytic Nevi - Tan-brown and/or pink-flesh-colored symmetric macules and papules - Benign appearing on exam today - Observation - Call clinic for new or changing moles - Recommend daily use of broad spectrum spf 30+ sunscreen to sun-exposed areas.   Lentigines - Scattered tan macules - Due to sun exposure - Benign-appearing, observe - Recommend daily broad spectrum sunscreen SPF 30+ to sun-exposed areas, reapply every 2 hours as needed. - Call for any changes  Return for cyst excision - L med scapula.  Luther Redo, CMA, am acting as scribe for Sarina Ser, MD . Documentation: I have reviewed the above documentation for accuracy and completeness, and I agree with the above.  Sarina Ser, MD

## 2022-08-06 ENCOUNTER — Telehealth: Payer: Self-pay

## 2022-08-06 NOTE — Telephone Encounter (Signed)
-----   Message from Ralene Bathe, MD sent at 08/05/2022  5:00 PM EDT ----- Diagnosis Skin , right post base of neck ATYPICAL COMBINED MELANOCYTIC NEVUS, BASE INVOLVED, SEE DESCRIPTION  Atypical mole This is considered "Pre-Cancerous" and the pathologist indicates there are likely abnormal cell remaining. Recommend excision / surgery Schedule surgery

## 2022-08-06 NOTE — Telephone Encounter (Signed)
Advised patient of pathology results and scheduled for excision/hd

## 2022-08-07 ENCOUNTER — Ambulatory Visit (INDEPENDENT_AMBULATORY_CARE_PROVIDER_SITE_OTHER): Payer: 59 | Admitting: Family

## 2022-08-07 ENCOUNTER — Encounter: Payer: Self-pay | Admitting: Family

## 2022-08-07 VITALS — BP 128/72 | HR 80 | Temp 98.0°F | Ht 73.0 in | Wt 212.6 lb

## 2022-08-07 DIAGNOSIS — I10 Essential (primary) hypertension: Secondary | ICD-10-CM

## 2022-08-07 DIAGNOSIS — M545 Low back pain, unspecified: Secondary | ICD-10-CM | POA: Diagnosis not present

## 2022-08-07 DIAGNOSIS — Z13220 Encounter for screening for lipoid disorders: Secondary | ICD-10-CM

## 2022-08-07 DIAGNOSIS — Z136 Encounter for screening for cardiovascular disorders: Secondary | ICD-10-CM

## 2022-08-07 DIAGNOSIS — Z125 Encounter for screening for malignant neoplasm of prostate: Secondary | ICD-10-CM

## 2022-08-07 DIAGNOSIS — G8929 Other chronic pain: Secondary | ICD-10-CM

## 2022-08-07 NOTE — Assessment & Plan Note (Signed)
Chronic, stable. Continue  amlodipine 5 mg, propranolol 40 mg daily.

## 2022-08-07 NOTE — Progress Notes (Signed)
Subjective:    [name], male    DOB: 1965/11/19, 56 y.o.   MRN: [id number]  CC: Jason Hooper is a 56 y.o. male who presents today for follow up.   HPI: Feels well today.  No new complaints   Hypertension-compliant with amlodipine 5 mg, propranolol 40 mg daily.  Denies chest pain, shortness of GERD-compliant with Prilosec 20 mg daily Chronic low back pain-feels pain is at baseline at this time.  Compliant with tramadol '150mg'$  daily, mobic 7.'5mg'$  daily.  He is also compliant with omeprazole 20 mg.  No epigastric pain   he feels medication regimen is working well for him.  Previously referred to pain management , however declines further evaluation at this time HISTORY:  Past Medical History:  Diagnosis Date   Alcohol abuse    Allergy    Atypical mole 07/29/2022   Right pst base of neck - needs surgery   Bipolar disorder ([place])    GERD (gastroesophageal reflux disease)    Kidney stones 2014   Past Surgical History:  Procedure Laterality Date   COLONOSCOPY WITH PROPOFOL N/A 01/24/2018   Procedure: COLONOSCOPY WITH PROPOFOL;  Surgeon: [name], MD;  Location: [facility] ENDOSCOPY;  Service: Gastroenterology;  Laterality: N/A;   ESOPHAGEAL DILATION     ESOPHAGOGASTRODUODENOSCOPY (EGD) WITH PROPOFOL N/A 01/24/2018   Procedure: ESOPHAGOGASTRODUODENOSCOPY (EGD) WITH PROPOFOL;  Surgeon: [name], MD;  Location: [facility] ENDOSCOPY;  Service: Gastroenterology;  Laterality: N/A;   JOINT REPLACEMENT Right 07/16/2017   R great toe   TONSILECTOMY/ADENOIDECTOMY WITH MYRINGOTOMY     Family History  Adopted: Yes  Family history unknown: Yes    Allergies: Patient has no known allergies. Current Outpatient Medications on File Prior to Visit  Medication Sig Dispense Refill   amLODipine (NORVASC) 5 MG tablet TAKE 1 TABLET BY MOUTH EVERY DAY 90 tablet 2   meloxicam (MOBIC) 7.5 MG tablet TAKE 1 TABLET BY MOUTH EVERY DAY AS NEEDED FOR PAIN 90 tablet 1   montelukast (SINGULAIR) 10 MG  tablet TAKE 1 TABLET BY MOUTH EVERYDAY AT BEDTIME 90 tablet 3   omeprazole (PRILOSEC) 20 MG capsule Take 1 capsule (20 mg total) by mouth daily. 90 capsule 2   propranolol (INDERAL) 40 MG tablet TAKE 1 TABLET BY MOUTH TWICE A DAY 180 tablet 1   sildenafil (VIAGRA) 100 MG tablet TAKE ONE TABLET BY MOUTH DAILY AS NEEDED 30 tablet 3   traMADol (ULTRAM) 50 MG tablet TAKE 1 TABLET BY MOUTH EVERY 8 HOURS AS NEEDED FOR MODERATE PAIN.DNFU 01/26/22 90 tablet 2   No current facility-administered medications on file prior to visit.    Social History   Tobacco Use   Smoking status: Never   Smokeless tobacco: Former    Types: Snuff    Quit date: 04/26/2014  Vaping Use   Vaping Use: Never used  Substance Use Topics   Alcohol use: Yes    Alcohol/week: 1.0 - 2.0 standard drink of alcohol    Types: 1 - 2 Cans of beer per week    Comment: occasional   Drug use: No    Review of Systems  Constitutional:  Negative for chills and fever.  Respiratory:  Negative for cough.   Cardiovascular:  Negative for chest pain and palpitations.  Gastrointestinal:  Negative for nausea and vomiting.  Musculoskeletal:  Positive for back pain.      Objective:    BP 128/72 (BP Location: Left Arm, Patient Position: Sitting, Cuff Size: Normal)  Pulse 80   Temp 98 F (36.7 C) (Oral)   Ht '6\' 1"'$  (1.854 m)   Wt 212 lb 9.6 oz (96.4 kg)   SpO2 98%   BMI 28.05 kg/m  BP Readings from Last 3 Encounters:  08/07/22 128/72  01/02/22 120/82  03/14/21 118/80   Wt Readings from Last 3 Encounters:  08/07/22 212 lb 9.6 oz (96.4 kg)  01/02/22 214 lb 6.4 oz (97.3 kg)  09/23/21 213 lb (96.6 kg)    Physical Exam Vitals reviewed.  Constitutional:      Appearance: He is well-developed.  Cardiovascular:     Rate and Rhythm: Regular rhythm.     Heart sounds: Normal heart sounds.  Pulmonary:     Effort: Pulmonary effort is normal. No respiratory distress.     Breath sounds: Normal breath sounds. No wheezing, rhonchi or  rales.  Skin:    General: Skin is warm and dry.  Neurological:     Mental Status: He is alert.  Psychiatric:        Speech: Speech normal.        Behavior: Behavior normal.        Assessment & Plan:   Problem List Items Addressed This Visit       Cardiovascular and Mediastinum   HTN (hypertension) - Primary    Chronic, stable. Continue  amlodipine 5 mg, propranolol 40 mg daily.      Relevant Orders   Comprehensive metabolic panel   Lipid panel     Other   Chronic left-sided low back pain without sciatica    Chronic, stable.  Declines further evaluation pain management.  He may consider this in the future. Continue tramadol '150mg'$  daily, mobic 7.'5mg'$  daily.  Due to history of gastritis, reiterated the importance of remaining  compliant with omeprazole 20 mg and taking mobic with food.  Advised that he trial Tylenol arthritis 650 mg tablet over-the-counter to see if he may use this instead of meloxicam daily.       Other Visit Diagnoses     Screening for prostate cancer       Relevant Orders   PSA   Encounter for lipid screening for cardiovascular disease       Relevant Orders   Lipid panel        I have discontinued [name]. Follmer "Michael"'s cyclobenzaprine. I am also having him maintain his omeprazole, meloxicam, sildenafil, amLODipine, propranolol, montelukast, and traMADol.   No orders of the defined types were placed in this encounter.   Return precautions given.   Risks, benefits, and alternatives of the medications and treatment plan prescribed today were discussed, and patient expressed understanding.   Education regarding symptom management and diagnosis given to patient on AVS.  Continue to follow with Burnard Hawthorne, FNP for routine health maintenance.   [name] and I agreed with plan.   [name], FNP

## 2022-08-07 NOTE — Assessment & Plan Note (Signed)
Chronic, stable.  Declines further evaluation pain management.  He may consider this in the future. Continue tramadol '150mg'$  daily, mobic 7.'5mg'$  daily.  Due to history of gastritis, reiterated the importance of remaining  compliant with omeprazole 20 mg and taking mobic with food.  Advised that he trial Tylenol arthritis 650 mg tablet over-the-counter to see if he may use this instead of meloxicam daily.

## 2022-08-08 ENCOUNTER — Encounter: Payer: Self-pay | Admitting: Dermatology

## 2022-08-08 LAB — COMPREHENSIVE METABOLIC PANEL
AG Ratio: 2 (calc) (ref 1.0–2.5)
ALT: 32 U/L (ref 9–46)
AST: 21 U/L (ref 10–35)
Albumin: 4.7 g/dL (ref 3.6–5.1)
Alkaline phosphatase (APISO): 75 U/L (ref 35–144)
BUN: 13 mg/dL (ref 7–25)
CO2: 30 mmol/L (ref 20–32)
Calcium: 9.8 mg/dL (ref 8.6–10.3)
Chloride: 101 mmol/L (ref 98–110)
Creat: 1.02 mg/dL (ref 0.70–1.30)
Globulin: 2.4 g/dL (calc) (ref 1.9–3.7)
Glucose, Bld: 133 mg/dL — ABNORMAL HIGH (ref 65–99)
Potassium: 4.2 mmol/L (ref 3.5–5.3)
Sodium: 141 mmol/L (ref 135–146)
Total Bilirubin: 0.9 mg/dL (ref 0.2–1.2)
Total Protein: 7.1 g/dL (ref 6.1–8.1)

## 2022-08-08 LAB — LIPID PANEL
Cholesterol: 188 mg/dL (ref <200)
HDL: 42 mg/dL (ref >=40)
LDL Cholesterol (Calc): 128 mg/dL (calc) — ABNORMAL HIGH
Non-HDL Cholesterol (Calc): 146 mg/dL (calc) — ABNORMAL HIGH (ref <130)
Total CHOL/HDL Ratio: 4.5 (calc) (ref <5.0)
Triglycerides: 83 mg/dL (ref <150)

## 2022-08-08 LAB — PSA: PSA: 0.38 ng/mL (ref <=4.00)

## 2022-08-11 ENCOUNTER — Telehealth: Payer: Self-pay

## 2022-08-11 NOTE — Telephone Encounter (Signed)
LMOM for pt to CB in regards to labs 

## 2022-08-13 ENCOUNTER — Telehealth: Payer: Self-pay

## 2022-08-13 NOTE — Telephone Encounter (Signed)
LVM to call back to go over results 

## 2022-08-18 ENCOUNTER — Other Ambulatory Visit: Payer: Self-pay

## 2022-08-18 DIAGNOSIS — I10 Essential (primary) hypertension: Secondary | ICD-10-CM

## 2022-08-18 MED ORDER — ROSUVASTATIN CALCIUM 5 MG PO TABS: 5.0000 mg | ORAL_TABLET | Freq: Every day | ORAL | 3 refills | Status: DC

## 2022-08-18 NOTE — Addendum Note (Signed)
Addended by: [place], Jasimine Simms on: 08/18/2022 05:01 PM   Modules accepted: Orders

## 2022-08-20 ENCOUNTER — Other Ambulatory Visit: Payer: Self-pay | Admitting: Family

## 2022-08-20 DIAGNOSIS — G8929 Other chronic pain: Secondary | ICD-10-CM

## 2022-09-08 ENCOUNTER — Ambulatory Visit (INDEPENDENT_AMBULATORY_CARE_PROVIDER_SITE_OTHER): Payer: 59 | Admitting: Dermatology

## 2022-09-08 VITALS — BP 121/75 | HR 62

## 2022-09-08 DIAGNOSIS — D485 Neoplasm of uncertain behavior of skin: Secondary | ICD-10-CM

## 2022-09-08 MED ORDER — MUPIROCIN 2 % EX OINT: 1.0000 | TOPICAL_OINTMENT | Freq: Every day | CUTANEOUS | 0 refills | Status: DC

## 2022-09-08 NOTE — Progress Notes (Signed)
   Follow-Up Visit   Subjective  Jason Hooper is a 56 y.o. male who presents for the following: Procedure (Biopsy proven atypical combined melanocytic nevus of right post base of neck - [facility] today).  The following portions of the chart were reviewed this encounter and updated as appropriate:   Tobacco  Allergies  Meds  Problems  Med Hx  Surg Hx  Fam Hx     Review of Systems:  No other skin or systemic complaints except as noted in HPI or Assessment and Plan.  Objective  Well appearing patient in no apparent distress; mood and affect are within normal limits.  A focused examination was performed including neck. Relevant physical exam findings are noted in the Assessment and Plan.  Right post base of neck Healing biopsy site   Assessment & Plan  Neoplasm of uncertain behavior of skin Right post base of neck  Skin excision  Lesion length (cm):  1 Lesion width (cm):  0.7 Margin per side (cm):  0.2 Total excision diameter (cm):  1.4 Informed consent: discussed and consent obtained   Timeout: patient name, date of birth, surgical site, and procedure verified   Procedure prep:  Patient was prepped and draped in usual sterile fashion Prep type:  Isopropyl alcohol and povidone-iodine Anesthesia: the lesion was anesthetized in a standard fashion   Anesthetic:  1% lidocaine w/ epinephrine 1-100,000 buffered w/ 8.4% NaHCO3 Instrument used: #15 blade   Hemostasis achieved with: pressure   Hemostasis achieved with comment:  Electrocautery Outcome: patient tolerated procedure well with no complications   Post-procedure details: sterile dressing applied and wound care instructions given   Dressing type: bandage and pressure dressing (mupirocin)    Skin repair Complexity:  Complex Final length (cm):  3 Reason for type of repair: reduce tension to allow closure, reduce the risk of dehiscence, infection, and necrosis, reduce subcutaneous dead space and avoid a hematoma, allow  closure of the large defect, preserve normal anatomy, preserve normal anatomical and functional relationships and enhance both functionality and cosmetic results   Undermining: area extensively undermined   Undermining comment:  Undermining defect 1.1 cm Subcutaneous layers (deep stitches):  Suture size:  2-0 Suture type: Vicryl (polyglactin 910)   Subcutaneous suture technique: inverted dermal. Fine/surface layer approximation (top stitches):  Suture size:  3-0 Suture type: nylon   Stitches: simple running   Suture removal (days):  7 Hemostasis achieved with: suture and pressure Outcome: patient tolerated procedure well with no complications   Post-procedure details: sterile dressing applied and wound care instructions given   Dressing type: bandage and pressure dressing (mupirocin)    mupirocin ointment (BACTROBAN) 2 % Apply 1 Application topically daily. With dressing changes  Specimen 1 - Surgical pathology Differential Diagnosis: Biopsy proven atypical combined melanocytic nevus  Check Margins: Yes [phone]   Return in about 1 week (around 09/15/2022) for suture removal, TBSE.  I, Ashok Cordia, CMA, am acting as scribe for Sarina Ser, MD . Documentation: I have reviewed the above documentation for accuracy and completeness, and I agree with the above.  Sarina Ser, MD

## 2022-09-08 NOTE — Patient Instructions (Addendum)

## 2022-09-09 ENCOUNTER — Telehealth: Payer: Self-pay | Admitting: Family

## 2022-09-09 ENCOUNTER — Telehealth: Payer: Self-pay

## 2022-09-09 ENCOUNTER — Other Ambulatory Visit: Payer: Self-pay | Admitting: Family

## 2022-09-09 DIAGNOSIS — E785 Hyperlipidemia, unspecified: Secondary | ICD-10-CM

## 2022-09-09 NOTE — Telephone Encounter (Signed)
Pt doing fine after yesterdays surgery./sh 

## 2022-09-09 NOTE — Telephone Encounter (Signed)
Patient has a lab appointment 09/15/2022, there are no orders in.

## 2022-09-10 NOTE — Telephone Encounter (Signed)
Looks like CMP lab was ordered yesterday around 3.   Is CMP the only lab needed for this pt Jason Hooper?

## 2022-09-13 ENCOUNTER — Telehealth: Payer: 59 | Admitting: Physician Assistant

## 2022-09-13 DIAGNOSIS — U071 COVID-19: Secondary | ICD-10-CM

## 2022-09-13 MED ORDER — MOLNUPIRAVIR EUA 200MG CAPSULE: 4.0000 | ORAL_CAPSULE | Freq: Two times a day (BID) | ORAL | 0 refills | Status: AC

## 2022-09-13 NOTE — Progress Notes (Signed)
Virtual Visit via Video   I connected with Jason Hooper on 09/13/22 at 10:30 AM EST by a video enabled telemedicine application and verified that I am speaking with the correct person using two identifiers. Location patient: Home Location provider: [facility], Office Persons participating in the virtual visit: Hammad, Finkler PA-C  I discussed the limitations of evaluation and management by telemedicine and the availability of in person appointments. The patient expressed understanding and agreed to proceed.   Subjective:   HPI:   COVID-19 Positive Symptom onset: yesterday (12/2) Travel/contacts: works for [employer]  Vaccination status: had initial series Testing results: positive today  Patient endorses the following symptoms: nasal congestion, head pressure, cough  Patient denies the following symptoms: chest pain, SOB  Treatments tried: rest, fluids  ROS: See pertinent positives and negatives per HPI.  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   Chronic pain due to trauma 07/31/2019   Multiple closed fractures of ribs of left side 07/31/2019   Closed fracture of nasal bones 07/31/2019   Closed fracture of right orbital floor ([place]) 07/31/2019   Renal cyst, right 05/08/2019   Pulmonary nodule, right 05/08/2019   Leg edema, left 04/19/2019   Tenesmus 01/23/2019   GERD (gastroesophageal reflux disease) 10/21/2018   Routine health maintenance 04/18/2018   Screen for STD (sexually transmitted disease) 04/18/2018   Esophageal stricture 12/24/2017   HTN (hypertension) 12/24/2017   Muscle spasm 12/24/2017   Osteoarthritis of first metatarsophalangeal (MTP) joint of right foot 05/03/2017   Chronic left-sided low back pain without sciatica 04/26/2017   Right hip pain 04/26/2017   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 04/26/2017   Erectile dysfunction 04/26/2017   Bipolar disorder ([place])     Allergy     Social History   Tobacco Use   Smoking status: Never   Smokeless tobacco: Former    Types: Snuff    Quit date: 04/26/2014  Substance Use Topics   Alcohol use: Yes    Alcohol/week: 1.0 - 2.0 standard drink of alcohol    Types: 1 - 2 Cans of beer per week    Comment: occasional    Current Outpatient Medications:    molnupiravir EUA (LAGEVRIO) 200 mg CAPS capsule, Take 4 capsules (800 mg total) by mouth 2 (two) times daily for 5 days., Disp: 40 capsule, Rfl: 0   amLODipine (NORVASC) 5 MG tablet, TAKE 1 TABLET BY MOUTH EVERY DAY, Disp: 90 tablet, Rfl: 2   meloxicam (MOBIC) 7.5 MG tablet, TAKE 1 TABLET BY MOUTH EVERY DAY AS NEEDED FOR PAIN, Disp: 90 tablet, Rfl: 1   montelukast (SINGULAIR) 10 MG tablet, TAKE 1 TABLET BY MOUTH EVERYDAY AT BEDTIME, Disp: 90 tablet, Rfl: 3   mupirocin ointment (BACTROBAN) 2 %, Apply 1 Application topically daily. With dressing changes, Disp: 22 g, Rfl: 0   omeprazole (PRILOSEC) 20 MG capsule, Take 1 capsule (20 mg total) by mouth daily., Disp: 90 capsule, Rfl: 2   propranolol (INDERAL) 40 MG tablet, TAKE 1 TABLET BY MOUTH TWICE A DAY, Disp: 180 tablet, Rfl: 1   rosuvastatin (CRESTOR) 5 MG tablet, Take 1 tablet (5 mg total) by mouth daily., Disp: 90 tablet, Rfl: 3   sildenafil (VIAGRA) 100 MG tablet, TAKE ONE TABLET BY MOUTH DAILY AS NEEDED, Disp: 30 tablet, Rfl: 3   traMADol (ULTRAM) 50 MG tablet, TAKE 1 TABLET BY MOUTH EVERY 8 HOURS  AS NEEDED FOR MODERATE PAIN.DNFU 01/26/22, Disp: 90 tablet, Rfl: 2  No Known Allergies  Objective:   VITALS: Per patient if applicable, see vitals. GENERAL: Alert, appears well and in no acute distress. HEENT: Atraumatic, conjunctiva clear, no obvious abnormalities on inspection of external nose and ears. NECK: Normal movements of the head and neck. CARDIOPULMONARY: No increased WOB. Speaking in clear sentences. I:E ratio WNL.  MS: Moves all visible extremities without noticeable abnormality. PSYCH: Pleasant and  cooperative, well-groomed. Speech normal rate and rhythm. Affect is appropriate. Insight and judgement are appropriate. Attention is focused, linear, and appropriate.  NEURO: CN grossly intact. Oriented as arrived to appointment on time with no prompting. Moves both UE equally.  SKIN: No obvious lesions, wounds, erythema, or cyanosis noted on face or hands.  Assessment and Plan:   Diagnoses and all orders for this visit:  COVID-19  Other orders -     molnupiravir EUA (LAGEVRIO) 200 mg CAPS capsule; Take 4 capsules (800 mg total) by mouth 2 (two) times daily for 5 days.   No red flags on discussion, patient is not in any obvious distress during our visit. Discussed progression of most viral illnesses, and recommended supportive care at this point in time.  I did however provide rx for Molnupiravir. Discussed over the counter supportive care options, including Tylenol 500 mg q 8 hours, with recommendations to push fluids and rest. Reviewed return precautions including new/worsening fever, SOB, new/worsening cough, sudden onset changes of symptoms. Recommended need to self-quarantine and practice social distancing until symptoms resolve. I recommend that patient follow-up if symptoms worsen or persist despite treatment x 7-10 days, sooner if needed.  I discussed the assessment and treatment plan with the patient. The patient was provided an opportunity to ask questions and all were answered. The patient agreed with the plan and demonstrated an understanding of the instructions.   The patient was advised to call back or seek an in-person evaluation if the symptoms worsen or if the condition fails to improve as anticipated.    [place], [place] 09/13/2022

## 2022-09-15 ENCOUNTER — Telehealth: Payer: Self-pay

## 2022-09-15 ENCOUNTER — Ambulatory Visit: Payer: 59 | Admitting: Dermatology

## 2022-09-15 ENCOUNTER — Encounter: Payer: 59 | Admitting: Dermatology

## 2022-09-15 ENCOUNTER — Other Ambulatory Visit: Payer: 59

## 2022-09-15 NOTE — Telephone Encounter (Signed)
-----   Message from Ralene Bathe, MD sent at 09/14/2022 12:42 PM EST ----- Diagnosis Skin (M), right post base of neck CHANGES CONSISTENT WITH PREVIOUS PROCEDURE, NO RESIDUAL LESION, MARGINS FREE  Site of Atypical Combined Nevus Margins clear

## 2022-09-15 NOTE — Telephone Encounter (Signed)
Patient informed of pathology results. He states that he will pick up a suture removal kit from our office and have someone remove them for him since he recently tested positive for Covid.  

## 2022-09-20 ENCOUNTER — Encounter: Payer: Self-pay | Admitting: Dermatology

## 2022-09-29 ENCOUNTER — Other Ambulatory Visit (INDEPENDENT_AMBULATORY_CARE_PROVIDER_SITE_OTHER): Payer: 59

## 2022-09-29 ENCOUNTER — Telehealth: Payer: Self-pay | Admitting: Family

## 2022-09-29 DIAGNOSIS — E785 Hyperlipidemia, unspecified: Secondary | ICD-10-CM | POA: Diagnosis not present

## 2022-09-29 DIAGNOSIS — R7309 Other abnormal glucose: Secondary | ICD-10-CM

## 2022-09-29 LAB — COMPREHENSIVE METABOLIC PANEL
ALT: 25 U/L (ref 0–53)
AST: 19 U/L (ref 0–37)
Albumin: 4.4 g/dL (ref 3.5–5.2)
Alkaline Phosphatase: 66 U/L (ref 39–117)
BUN: 15 mg/dL (ref 6–23)
CO2: 32 mEq/L (ref 19–32)
Calcium: 9.3 mg/dL (ref 8.4–10.5)
Chloride: 101 mEq/L (ref 96–112)
Creatinine, Ser: 1.02 mg/dL (ref 0.40–1.50)
GFR: 81.65 mL/min (ref >60.00)
Glucose, Bld: 123 mg/dL — ABNORMAL HIGH (ref 70–99)
Potassium: 4.2 mEq/L (ref 3.5–5.1)
Sodium: 138 mEq/L (ref 135–145)
Total Bilirubin: 0.6 mg/dL (ref 0.2–1.2)
Total Protein: 6.6 g/dL (ref 6.0–8.3)

## 2022-09-29 NOTE — Telephone Encounter (Signed)
Can we add A1c to pt lab from today?

## 2022-10-08 ENCOUNTER — Other Ambulatory Visit: Payer: Self-pay | Admitting: Family

## 2022-10-08 DIAGNOSIS — M545 Low back pain, unspecified: Secondary | ICD-10-CM

## 2022-10-26 ENCOUNTER — Other Ambulatory Visit: Payer: Self-pay | Admitting: Family

## 2022-10-26 DIAGNOSIS — G8929 Other chronic pain: Secondary | ICD-10-CM

## 2022-10-27 NOTE — Telephone Encounter (Signed)
  Last refill: 09/28/2022  LOV: 08/07/22 NOV: 11/06/22

## 2022-11-06 ENCOUNTER — Encounter: Payer: Self-pay | Admitting: Family

## 2022-11-06 ENCOUNTER — Ambulatory Visit (INDEPENDENT_AMBULATORY_CARE_PROVIDER_SITE_OTHER): Payer: 59 | Admitting: Family

## 2022-11-06 VITALS — BP 124/82 | HR 63 | Temp 97.6°F | Ht 73.0 in | Wt 214.8 lb

## 2022-11-06 DIAGNOSIS — G8929 Other chronic pain: Secondary | ICD-10-CM

## 2022-11-06 DIAGNOSIS — J40 Bronchitis, not specified as acute or chronic: Secondary | ICD-10-CM | POA: Diagnosis not present

## 2022-11-06 DIAGNOSIS — K219 Gastro-esophageal reflux disease without esophagitis: Secondary | ICD-10-CM | POA: Diagnosis not present

## 2022-11-06 DIAGNOSIS — R7309 Other abnormal glucose: Secondary | ICD-10-CM

## 2022-11-06 DIAGNOSIS — M545 Low back pain, unspecified: Secondary | ICD-10-CM | POA: Diagnosis not present

## 2022-11-06 LAB — POCT GLYCOSYLATED HEMOGLOBIN (HGB A1C): Hemoglobin A1C: 5.6 % (ref 4.0–5.6)

## 2022-11-06 MED ORDER — OMEPRAZOLE 20 MG PO CPDR: 20.0000 mg | DELAYED_RELEASE_CAPSULE | Freq: Every day | ORAL | 2 refills | Status: DC

## 2022-11-06 MED ORDER — CYCLOBENZAPRINE HCL 10 MG PO TABS: 10.0000 mg | ORAL_TABLET | Freq: Every evening | ORAL | 0 refills | Status: DC | PRN

## 2022-11-06 MED ORDER — MELOXICAM 7.5 MG PO TABS: ORAL_TABLET | ORAL | 1 refills | Status: DC

## 2022-11-06 NOTE — Assessment & Plan Note (Signed)
Chronic, stable.  Continue tramadol '150mg'$  daily, mobic 7.'5mg'$  daily.  Provided refill of Flexeril 10 mg nightly.  He declines updating low back imaging at this time as back pain is unchanged

## 2022-11-06 NOTE — Assessment & Plan Note (Signed)
Afebrile without respiratory distress. Improving.  Patient will continue DayQuil, NyQuil as needed.  He will let me know if cough persist as I would advise antibiotic.

## 2022-11-06 NOTE — Progress Notes (Signed)
Assessment & Plan:  Chronic left-sided low back pain without sciatica Assessment & Plan: Chronic, stable.  Continue tramadol '150mg'$  daily, mobic 7.'5mg'$  daily.  Provided refill of Flexeril 10 mg nightly.  He declines updating low back imaging at this time as back pain is unchanged  Orders: -     Meloxicam; TAKE 1 TABLET BY MOUTH EVERY DAY AS NEEDED FOR PAIN  Dispense: 90 tablet; Refill: 1 -     Cyclobenzaprine HCl; Take 1 tablet (10 mg total) by mouth at bedtime as needed for muscle spasms.  Dispense: 90 tablet; Refill: 0  Elevated glucose -     POCT glycosylated hemoglobin (Hb A1C)  Gastroesophageal reflux disease, unspecified whether esophagitis present -     Omeprazole; Take 1 capsule (20 mg total) by mouth daily.  Dispense: 90 capsule; Refill: 2  Bronchitis Assessment & Plan: Afebrile without respiratory distress. Improving.  Patient will continue DayQuil, NyQuil as needed.  He will let me know if cough persist as I would advise antibiotic.       Return precautions given.   Risks, benefits, and alternatives of the medications and treatment plan prescribed today were discussed, and patient expressed understanding.   Education regarding symptom management and diagnosis given to patient on AVS either electronically or printed.  Return in about 3 months (around 02/05/2023).  [name], FNP  Subjective:    [name], male    DOB: [birth year], 57 y.o.   MRN: [id number]  CC: Jason Hooper is a 57 y.o. male who presents today for follow up.   HPI: Complains of dry cough x 7 days, comes and goes.  Worse at night . Scant clear nasal discharge.   No fever, wheezing, sob, cp, ear pain, sore throat.  He is taking dayquil and nyquil with relief.   No h/o asthma Never smoker      Chronic left LBP-back pain is unchanged from prio .  No numbness or tingling.  Compliant with tramadol '150mg'$  daily, mobic 7.'5mg'$  daily.  He has run out flexeril and would like refill  for back spasm.  HLD-he is compliant with Crestor 5 mg Allergies: Patient has no known allergies. Current Outpatient Medications on File Prior to Visit  Medication Sig Dispense Refill   amLODipine (NORVASC) 5 MG tablet TAKE 1 TABLET BY MOUTH EVERY DAY 90 tablet 2   montelukast (SINGULAIR) 10 MG tablet TAKE 1 TABLET BY MOUTH EVERYDAY AT BEDTIME 90 tablet 3   mupirocin ointment (BACTROBAN) 2 % Apply 1 Application topically daily. With dressing changes 22 g 0   propranolol (INDERAL) 40 MG tablet TAKE 1 TABLET BY MOUTH TWICE A DAY 180 tablet 1   rosuvastatin (CRESTOR) 5 MG tablet Take 1 tablet (5 mg total) by mouth daily. 90 tablet 3   sildenafil (VIAGRA) 100 MG tablet TAKE ONE TABLET BY MOUTH DAILY AS NEEDED 30 tablet 3   traMADol (ULTRAM) 50 MG tablet TAKE 1 TABLET BY MOUTH EVERY 8 HOURS AS NEEDED FOR MODERATE PAIN. 90 tablet 2   No current facility-administered medications on file prior to visit.    Review of Systems  Constitutional:  Negative for chills and fever.  HENT:  Positive for congestion.   Respiratory:  Positive for cough. Negative for shortness of breath.   Cardiovascular:  Negative for chest pain and palpitations.  Gastrointestinal:  Negative for nausea and vomiting.  Musculoskeletal:  Positive for back pain. Arthralgias: at baseline. Neurological:  Negative for numbness.  Objective:    BP 124/82   Pulse 63   Temp 97.6 F (36.4 C) (Oral)   Ht '6\' 1"'$  (1.854 m)   Wt 214 lb 12.8 oz (97.4 kg)   SpO2 99%   BMI 28.34 kg/m  BP Readings from Last 3 Encounters:  11/06/22 124/82  09/08/22 121/75  08/07/22 128/72   Wt Readings from Last 3 Encounters:  11/06/22 214 lb 12.8 oz (97.4 kg)  08/07/22 212 lb 9.6 oz (96.4 kg)  01/02/22 214 lb 6.4 oz (97.3 kg)    Physical Exam Vitals reviewed.  Constitutional:      Appearance: He is well-developed.  Cardiovascular:     Rate and Rhythm: Regular rhythm.     Heart sounds: Normal heart sounds.  Pulmonary:     Effort:  Pulmonary effort is normal. No respiratory distress.     Breath sounds: Normal breath sounds. No wheezing or rales.  Musculoskeletal:     Lumbar back: No swelling, spasms or tenderness. Normal range of motion.     Comments: Full range of motion with flexion, extension, lateral side bends. No pain, numbness, tingling elicited with single leg raise bilaterally. No rash.  Skin:    General: Skin is warm and dry.  Neurological:     Mental Status: He is alert.  Psychiatric:        Speech: Speech normal.        Behavior: Behavior normal.

## 2023-01-21 ENCOUNTER — Other Ambulatory Visit: Payer: Self-pay | Admitting: Family

## 2023-01-21 DIAGNOSIS — I10 Essential (primary) hypertension: Secondary | ICD-10-CM

## 2023-01-24 ENCOUNTER — Other Ambulatory Visit: Payer: Self-pay | Admitting: Family

## 2023-01-24 DIAGNOSIS — G8929 Other chronic pain: Secondary | ICD-10-CM

## 2023-01-25 NOTE — Telephone Encounter (Signed)
Last refill: 12/25/2022   LOV: 11/06/22  NOV: Not scheduled

## 2023-01-26 NOTE — Telephone Encounter (Signed)
LVM to inform pt that Tramadol has been refilled  but he needs to Schedule follow-up.  he needs to be seen every 3 months

## 2023-01-29 ENCOUNTER — Telehealth: Payer: Self-pay | Admitting: Family

## 2023-01-29 NOTE — Telephone Encounter (Signed)
Follow up     Returning call back to the CMA.

## 2023-01-29 NOTE — Telephone Encounter (Signed)
LVM to inform pt that Tramadol has been refilled  but he needs to Schedule follow-up.  he needs to be seen every 3 months   

## 2023-02-02 NOTE — Telephone Encounter (Signed)
Spoke to pt and scheduled him for f/up appt

## 2023-02-03 NOTE — Telephone Encounter (Signed)
Pt has appt on  02/12/23

## 2023-02-12 ENCOUNTER — Ambulatory Visit (INDEPENDENT_AMBULATORY_CARE_PROVIDER_SITE_OTHER): Payer: 59 | Admitting: Family

## 2023-02-12 ENCOUNTER — Encounter: Payer: Self-pay | Admitting: Family

## 2023-02-12 VITALS — BP 128/80 | HR 67 | Temp 97.7°F | Ht 73.0 in | Wt 217.6 lb

## 2023-02-12 DIAGNOSIS — M720 Palmar fascial fibromatosis [Dupuytren]: Secondary | ICD-10-CM

## 2023-02-12 DIAGNOSIS — M545 Low back pain, unspecified: Secondary | ICD-10-CM

## 2023-02-12 DIAGNOSIS — G8929 Other chronic pain: Secondary | ICD-10-CM | POA: Diagnosis not present

## 2023-02-12 MED ORDER — CYCLOBENZAPRINE HCL 10 MG PO TABS: 10.0000 mg | ORAL_TABLET | Freq: Every evening | ORAL | 0 refills | Status: DC | PRN

## 2023-02-12 MED ORDER — TRAMADOL HCL 50 MG PO TABS: 50.0000 mg | ORAL_TABLET | Freq: Three times a day (TID) | ORAL | 2 refills | Status: DC

## 2023-02-12 NOTE — Progress Notes (Unsigned)
Assessment & Plan:  Dupuytren's contracture Assessment & Plan: Presentation consistent with Dupuytren's contracture right fifth finger.  Referral to Dr. Stephenie Acres.  Orders: -     Ambulatory referral to Orthopedic Surgery  Chronic left-sided low back pain without sciatica Assessment & Plan: Chronic, stable.  Continue tramadol 150mg  daily, mobic 7.5mg  daily, Flexeril 10 mg nightly prn.   Orders: -     Cyclobenzaprine HCl; Take 1 tablet (10 mg total) by mouth at bedtime as needed for muscle spasms.  Dispense: 90 tablet; Refill: 0 -     traMADol HCl; Take 1 tablet (50 mg total) by mouth 3 (three) times daily.  Dispense: 90 tablet; Refill: 2     Return precautions given.   Risks, benefits, and alternatives of the medications and treatment plan prescribed today were discussed, and patient expressed understanding.   Education regarding symptom management and diagnosis given to patient on AVS either electronically or printed.  Return in about 3 months (around 05/15/2023).  Rennie Plowman, FNP  Subjective:    [name], male    DOB: 27-Feb-1966, 57 y.o.   MRN: [id number]  CC: Jason Hooper is a 57 y.o. male who presents today for follow up.   HPI: Complains of bilateral 5th finger intermittent numbness.  Right fifth finger contraction.  This has been going on for years.  No hand weakness.  Right hand dominant.    Compliant with tramadol 150mg  daily, mobic 7.5mg  daily.  Back pain is at baseline.  Medications are working well.  .  Allergies: Patient has no known allergies. Current Outpatient Medications on File Prior to Visit  Medication Sig Dispense Refill   amLODipine (NORVASC) 5 MG tablet TAKE 1 TABLET BY MOUTH EVERY DAY 90 tablet 2   meloxicam (MOBIC) 7.5 MG tablet TAKE 1 TABLET BY MOUTH EVERY DAY AS NEEDED FOR PAIN 90 tablet 1   montelukast (SINGULAIR) 10 MG tablet TAKE 1 TABLET BY MOUTH EVERYDAY AT BEDTIME 90 tablet 3   mupirocin ointment (BACTROBAN) 2 % Apply 1  Application topically daily. With dressing changes 22 g 0   omeprazole (PRILOSEC) 20 MG capsule Take 1 capsule (20 mg total) by mouth daily. 90 capsule 2   propranolol (INDERAL) 40 MG tablet TAKE 1 TABLET BY MOUTH TWICE A DAY 180 tablet 1   rosuvastatin (CRESTOR) 5 MG tablet Take 1 tablet (5 mg total) by mouth daily. 90 tablet 3   sildenafil (VIAGRA) 100 MG tablet TAKE ONE TABLET BY MOUTH DAILY AS NEEDED 30 tablet 3   No current facility-administered medications on file prior to visit.    Review of Systems  Constitutional:  Negative for chills and fever.  Respiratory:  Negative for cough.   Cardiovascular:  Negative for chest pain and palpitations.  Gastrointestinal:  Negative for nausea and vomiting.  Musculoskeletal:  Positive for arthralgias.  Neurological:  Positive for numbness.      Objective:    BP 128/80   Pulse 67   Temp 97.7 F (36.5 C) (Oral)   Ht 6\' 1"  (1.854 m)   Wt 217 lb 9.6 oz (98.7 kg)   SpO2 97%   BMI 28.71 kg/m  BP Readings from Last 3 Encounters:  02/12/23 128/80  11/06/22 124/82  09/08/22 121/75   Wt Readings from Last 3 Encounters:  02/12/23 217 lb 9.6 oz (98.7 kg)  11/06/22 214 lb 12.8 oz (97.4 kg)  08/07/22 212 lb 9.6 oz (96.4 kg)    Physical Exam Vitals reviewed.  Constitutional:      Appearance: He is well-developed.  Cardiovascular:     Rate and Rhythm: Regular rhythm.     Heart sounds: Normal heart sounds.  Pulmonary:     Effort: Pulmonary effort is normal. No respiratory distress.     Breath sounds: Normal breath sounds. No wheezing, rhonchi or rales.  Musculoskeletal:     Right hand: Deformity present. No swelling. Decreased range of motion. Normal sensation. Normal pulse.     Left hand: No swelling or deformity. Normal range of motion. Normal sensation. Normal pulse.     Comments: Right fifth finger contracture  Skin:    General: Skin is warm and dry.  Neurological:     Mental Status: He is alert.  Psychiatric:        Speech:  Speech normal.        Behavior: Behavior normal.

## 2023-02-13 NOTE — Patient Instructions (Signed)
Referral to [facility], Dr. Stephenie Acres.  Let us know if you dont hear back within a week in regards to an appointment being scheduled.   So that you are aware, if you are [facility] MyChart user , please pay attention to your MyChart messages as you may receive a MyChart message with a phone number to call and schedule this test/appointment own your own from our referral coordinator. This is a new process so I do not want you to miss this message.  If you are not a MyChart user, you will receive a phone call.

## 2023-02-13 NOTE — Assessment & Plan Note (Signed)
Presentation consistent with Dupuytren's contracture right fifth finger.  Referral to Dr. Stephenie Acres.

## 2023-02-13 NOTE — Assessment & Plan Note (Signed)
Chronic, stable.  Continue tramadol 150mg  daily, mobic 7.5mg  daily, Flexeril 10 mg nightly prn.

## 2023-02-16 ENCOUNTER — Telehealth: Payer: Self-pay | Admitting: Family

## 2023-02-16 NOTE — Telephone Encounter (Signed)
Leeroy Cha from [facility] called in staying that they would like to communicate a message to [place] or her assistant. She's available @ [phone]. This is concerning pt Ortho referral to [facility].

## 2023-02-17 NOTE — Telephone Encounter (Signed)
Called Boissevain @ [facility] and LVM to call me back in regards to mutual pt

## 2023-02-25 NOTE — Telephone Encounter (Signed)
LVM for Burna Mortimer @ [facility] @ [phone]) to return my call regarding Ortho referral for pt

## 2023-03-26 ENCOUNTER — Other Ambulatory Visit: Payer: Self-pay | Admitting: Family

## 2023-03-26 DIAGNOSIS — M545 Low back pain, unspecified: Secondary | ICD-10-CM

## 2023-03-26 NOTE — Telephone Encounter (Signed)
Refilled: 02/12/2023 Last OV: 02/12/2023 Next OV: not scheduled

## 2023-04-28 ENCOUNTER — Other Ambulatory Visit: Payer: Self-pay | Admitting: Family

## 2023-04-28 DIAGNOSIS — M545 Low back pain, unspecified: Secondary | ICD-10-CM

## 2023-07-02 ENCOUNTER — Other Ambulatory Visit: Payer: Self-pay | Admitting: Family

## 2023-07-02 DIAGNOSIS — M545 Low back pain, unspecified: Secondary | ICD-10-CM

## 2023-07-06 NOTE — Telephone Encounter (Signed)
LVM to call back He is due for appointment for controlled substance refill.  I have refilled tramadol however please schedule follow-up

## 2023-07-23 ENCOUNTER — Other Ambulatory Visit: Payer: Self-pay | Admitting: Family

## 2023-07-23 DIAGNOSIS — I10 Essential (primary) hypertension: Secondary | ICD-10-CM

## 2023-08-06 ENCOUNTER — Other Ambulatory Visit: Payer: Self-pay | Admitting: Family

## 2023-08-06 DIAGNOSIS — M545 Low back pain, unspecified: Secondary | ICD-10-CM

## 2023-08-29 ENCOUNTER — Other Ambulatory Visit: Payer: Self-pay | Admitting: Family

## 2023-08-29 DIAGNOSIS — M545 Low back pain, unspecified: Secondary | ICD-10-CM

## 2023-12-03 ENCOUNTER — Ambulatory Visit: Payer: BC Managed Care – PPO | Admitting: Family

## 2023-12-03 ENCOUNTER — Encounter: Payer: Self-pay | Admitting: Family

## 2023-12-03 VITALS — BP 138/80 | HR 71 | Temp 97.6°F | Ht 73.0 in | Wt 208.4 lb

## 2023-12-03 DIAGNOSIS — I10 Essential (primary) hypertension: Secondary | ICD-10-CM

## 2023-12-03 DIAGNOSIS — G8929 Other chronic pain: Secondary | ICD-10-CM

## 2023-12-03 DIAGNOSIS — Z13220 Encounter for screening for lipoid disorders: Secondary | ICD-10-CM | POA: Diagnosis not present

## 2023-12-03 DIAGNOSIS — Z136 Encounter for screening for cardiovascular disorders: Secondary | ICD-10-CM | POA: Diagnosis not present

## 2023-12-03 DIAGNOSIS — M545 Low back pain, unspecified: Secondary | ICD-10-CM

## 2023-12-03 DIAGNOSIS — J40 Bronchitis, not specified as acute or chronic: Secondary | ICD-10-CM

## 2023-12-03 DIAGNOSIS — Z125 Encounter for screening for malignant neoplasm of prostate: Secondary | ICD-10-CM

## 2023-12-03 LAB — COMPREHENSIVE METABOLIC PANEL
ALT: 31 U/L (ref 0–53)
AST: 26 U/L (ref 0–37)
Albumin: 4.4 g/dL (ref 3.5–5.2)
Alkaline Phosphatase: 88 U/L (ref 39–117)
BUN: 14 mg/dL (ref 6–23)
CO2: 33 meq/L — ABNORMAL HIGH (ref 19–32)
Calcium: 9.2 mg/dL (ref 8.4–10.5)
Chloride: 101 meq/L (ref 96–112)
Creatinine, Ser: 0.92 mg/dL (ref 0.40–1.50)
GFR: 91.66 mL/min (ref >60.00)
Glucose, Bld: 132 mg/dL — ABNORMAL HIGH (ref 70–99)
Potassium: 3.8 meq/L (ref 3.5–5.1)
Sodium: 142 meq/L (ref 135–145)
Total Bilirubin: 0.7 mg/dL (ref 0.2–1.2)
Total Protein: 6.9 g/dL (ref 6.0–8.3)

## 2023-12-03 LAB — PSA: PSA: 0.48 ng/mL (ref 0.10–4.00)

## 2023-12-03 LAB — LIPID PANEL
Cholesterol: 139 mg/dL (ref 0–200)
HDL: 60.8 mg/dL (ref >39.00)
LDL Cholesterol: 62 mg/dL (ref 0–99)
NonHDL: 78.62
Total CHOL/HDL Ratio: 2
Triglycerides: 84 mg/dL (ref 0.0–149.0)
VLDL: 16.8 mg/dL (ref 0.0–40.0)

## 2023-12-03 LAB — TSH: TSH: 0.47 u[IU]/mL (ref 0.35–5.50)

## 2023-12-03 MED ORDER — PROPRANOLOL HCL 40 MG PO TABS: 40.0000 mg | ORAL_TABLET | Freq: Two times a day (BID) | ORAL | 3 refills | Status: AC

## 2023-12-03 MED ORDER — AMLODIPINE BESYLATE 5 MG PO TABS: 5.0000 mg | ORAL_TABLET | Freq: Every day | ORAL | 3 refills | Status: DC

## 2023-12-03 MED ORDER — TRAMADOL HCL 50 MG PO TABS: 50.0000 mg | ORAL_TABLET | Freq: Three times a day (TID) | ORAL | 2 refills | Status: DC

## 2023-12-03 MED ORDER — CYCLOBENZAPRINE HCL 10 MG PO TABS: ORAL_TABLET | ORAL | 1 refills | Status: DC

## 2023-12-03 NOTE — Progress Notes (Signed)
 Assessment & Plan:  Screening for prostate cancer -     PSA  Chronic left-sided low back pain without sciatica Assessment & Plan: Chronic, stable.  Continue tramadol 150mg  daily, mobic 7.5mg  daily, Flexeril 10 mg nightly prn.   Orders: -     traMADol HCl; Take 1 tablet (50 mg total) by mouth 3 (three) times daily.  Dispense: 90 tablet; Refill: 2 -     Cyclobenzaprine HCl; Take 1  (10 mg) tablet by  mouth at bedtime as needed for muscle spasms  Dispense: 90 tablet; Refill: 1  Essential hypertension -     amLODIPine Besylate; Take 1 tablet (5 mg total) by mouth daily.  Dispense: 90 tablet; Refill: 3 -     Propranolol HCl; Take 1 tablet (40 mg total) by mouth 2 (two) times daily.  Dispense: 180 tablet; Refill: 3 -     TSH -     Comprehensive metabolic panel  Encounter for lipid screening for cardiovascular disease -     Lipid panel  Primary hypertension Assessment & Plan: Chronic, overall stable. Continue  amlodipine 5 mg, propranolol 40 mg daily.   Bronchitis Assessment & Plan: Reassuring exam and symptoms are improving. Advised to continue prednisone, doxycycline and let me know if symptoms do not completely resolve.       Return precautions given.   Risks, benefits, and alternatives of the medications and treatment plan prescribed today were discussed, and patient expressed understanding.   Education regarding symptom management and diagnosis given to patient on AVS either electronically or printed.  No follow-ups on file.  Rennie Plowman, FNP  Subjective:    [name], male    DOB: [birth year], 58 y.o.   MRN: [id number]  CC: Jason Hooper is a 58 y.o. male who presents today for follow up.   HPI: He reports that he was seen 2 days ago for productive cough x 2 weeks .  Started prednisone , Doxycycline 3 days ago.  Cough in morning and at night; improved from prior.      No fever, chills, wheezing, sob, CP.   Otherwise, he feels well today  without new complaints.  He is here for medication refills.Otherwise, feels well today. Here for medication follow up.    He is taking amlodipine 5mg  every other day.   Compliant with propranolol 40mg  which he believes propranolol was started for anxiety as well as blood pressure.  He would like refill of tramadol and Flexeril for low back pain. Back pain is at baseline.   Back pain is at the  Allergies: Patient has no known allergies. Current Outpatient Medications on File Prior to Visit  Medication Sig Dispense Refill   doxycycline (VIBRA-TABS) 100 MG tablet Take 100 mg by mouth 2 (two) times daily.     meloxicam (MOBIC) 7.5 MG tablet TAKE 1 TABLET BY MOUTH EVERY DAY AS NEEDED FOR PAIN 90 tablet 1   mupirocin ointment (BACTROBAN) 2 % Apply 1 Application topically daily. With dressing changes (Patient not taking: Reported on 12/04/2023) 22 g 0   omeprazole (PRILOSEC) 20 MG capsule Take 1 capsule (20 mg total) by mouth daily. 90 capsule 2   rosuvastatin (CRESTOR) 5 MG tablet TAKE 1 TABLET (5 MG TOTAL) BY MOUTH DAILY AT BEDTIME 90 tablet 3   sildenafil (VIAGRA) 100 MG tablet TAKE ONE TABLET BY MOUTH DAILY AS NEEDED 30 tablet 3   No current facility-administered medications on file prior to visit.    Review  of Systems  Constitutional:  Negative for chills and fever.  HENT:  Positive for congestion.   Respiratory:  Positive for cough. Negative for shortness of breath.   Cardiovascular:  Negative for chest pain and palpitations.  Gastrointestinal:  Negative for nausea and vomiting.  Musculoskeletal:  Positive for back pain.      Objective:    BP 138/80   Pulse 71   Temp 97.6 F (36.4 C) (Oral)   Ht 6\' 1"  (1.854 m)   Wt 208 lb 6.4 oz (94.5 kg)   SpO2 98%   BMI 27.50 kg/m  BP Readings from Last 3 Encounters:  12/05/23 (!) 148/89  12/03/23 138/80  02/12/23 128/80   Wt Readings from Last 3 Encounters:  12/04/23 208 lb (94.3 kg)  12/03/23 208 lb 6.4 oz (94.5 kg)  02/12/23 217  lb 9.6 oz (98.7 kg)    Physical Exam Vitals reviewed.  Constitutional:      Appearance: He is well-developed.  HENT:     Head: Normocephalic and atraumatic.     Right Ear: Hearing, tympanic membrane, ear canal and external ear normal. No decreased hearing noted. No drainage, swelling or tenderness. No middle ear effusion. Tympanic membrane is not injected, erythematous or bulging.     Left Ear: Hearing, tympanic membrane, ear canal and external ear normal. No decreased hearing noted. No drainage, swelling or tenderness.  No middle ear effusion. Tympanic membrane is not injected, erythematous or bulging.     Nose: Nose normal.     Right Sinus: No maxillary sinus tenderness or frontal sinus tenderness.     Left Sinus: No maxillary sinus tenderness or frontal sinus tenderness.     Mouth/Throat:     Pharynx: Uvula midline. No oropharyngeal exudate or posterior oropharyngeal erythema.     Tonsils: No tonsillar abscesses.  Eyes:     Conjunctiva/sclera: Conjunctivae normal.  Cardiovascular:     Rate and Rhythm: Regular rhythm.     Heart sounds: Normal heart sounds.  Pulmonary:     Effort: Pulmonary effort is normal. No respiratory distress.     Breath sounds: Normal breath sounds. No wheezing, rhonchi or rales.  Lymphadenopathy:     Head:     Right side of head: No submental, submandibular, tonsillar, preauricular, posterior auricular or occipital adenopathy.     Left side of head: No submental, submandibular, tonsillar, preauricular, posterior auricular or occipital adenopathy.     Cervical: No cervical adenopathy.  Skin:    General: Skin is warm and dry.  Neurological:     Mental Status: He is alert.  Psychiatric:        Speech: Speech normal.        Behavior: Behavior normal.

## 2023-12-04 ENCOUNTER — Observation Stay
Admission: EM | Admit: 2023-12-04 | Discharge: 2023-12-05 | Disposition: A | Discharge status: Home / Self Care | Payer: BC Managed Care – PPO | Attending: Internal Medicine | Admitting: Internal Medicine

## 2023-12-04 ENCOUNTER — Emergency Department: Payer: BC Managed Care – PPO

## 2023-12-04 ENCOUNTER — Other Ambulatory Visit: Payer: Self-pay

## 2023-12-04 ENCOUNTER — Observation Stay: Payer: BC Managed Care – PPO

## 2023-12-04 DIAGNOSIS — Z79899 Other long term (current) drug therapy: Secondary | ICD-10-CM | POA: Insufficient documentation

## 2023-12-04 DIAGNOSIS — K219 Gastro-esophageal reflux disease without esophagitis: Secondary | ICD-10-CM | POA: Insufficient documentation

## 2023-12-04 DIAGNOSIS — F1090 Alcohol use, unspecified, uncomplicated: Secondary | ICD-10-CM | POA: Insufficient documentation

## 2023-12-04 DIAGNOSIS — J209 Acute bronchitis, unspecified: Secondary | ICD-10-CM | POA: Diagnosis not present

## 2023-12-04 DIAGNOSIS — I10 Essential (primary) hypertension: Secondary | ICD-10-CM | POA: Insufficient documentation

## 2023-12-04 DIAGNOSIS — F319 Bipolar disorder, unspecified: Secondary | ICD-10-CM | POA: Diagnosis not present

## 2023-12-04 DIAGNOSIS — Z7901 Long term (current) use of anticoagulants: Secondary | ICD-10-CM | POA: Diagnosis not present

## 2023-12-04 DIAGNOSIS — N280 Ischemia and infarction of kidney: Principal | ICD-10-CM | POA: Insufficient documentation

## 2023-12-04 DIAGNOSIS — R059 Cough, unspecified: Secondary | ICD-10-CM | POA: Diagnosis present

## 2023-12-04 LAB — CBC
HCT: 42.5 % (ref 39.0–52.0)
Hemoglobin: 14.9 g/dL (ref 13.0–17.0)
MCH: 32.5 pg (ref 26.0–34.0)
MCHC: 35.1 g/dL (ref 30.0–36.0)
MCV: 92.8 fL (ref 80.0–100.0)
Platelets: 344 10*3/uL (ref 150–400)
RBC: 4.58 MIL/uL (ref 4.22–5.81)
RDW: 12.6 % (ref 11.5–15.5)
WBC: 15.9 10*3/uL — ABNORMAL HIGH (ref 4.0–10.5)
nRBC: 0 % (ref 0.0–0.2)

## 2023-12-04 LAB — COMPREHENSIVE METABOLIC PANEL
ALT: 40 U/L (ref 0–44)
AST: 29 U/L (ref 15–41)
Albumin: 4.2 g/dL (ref 3.5–5.0)
Alkaline Phosphatase: 76 U/L (ref 38–126)
Anion gap: 10 (ref 5–15)
BUN: 15 mg/dL (ref 6–20)
CO2: 30 mmol/L (ref 22–32)
Calcium: 9.3 mg/dL (ref 8.9–10.3)
Chloride: 100 mmol/L (ref 98–111)
Creatinine, Ser: 0.84 mg/dL (ref 0.61–1.24)
GFR, Estimated: >60 mL/min (ref >60)
Glucose, Bld: 138 mg/dL — ABNORMAL HIGH (ref 70–99)
Potassium: 3.4 mmol/L — ABNORMAL LOW (ref 3.5–5.1)
Sodium: 140 mmol/L (ref 135–145)
Total Bilirubin: 0.7 mg/dL (ref 0.0–1.2)
Total Protein: 7.6 g/dL (ref 6.5–8.1)

## 2023-12-04 LAB — LIPASE, BLOOD: Lipase: 55 U/L — ABNORMAL HIGH (ref 11–51)

## 2023-12-04 LAB — URINALYSIS, ROUTINE W REFLEX MICROSCOPIC
Bacteria, UA: NONE SEEN
Bilirubin Urine: NEGATIVE
Glucose, UA: NEGATIVE mg/dL
Hgb urine dipstick: NEGATIVE
Ketones, ur: NEGATIVE mg/dL
Leukocytes,Ua: NEGATIVE
Nitrite: NEGATIVE
Protein, ur: 30 mg/dL — AB
Specific Gravity, Urine: >1.046 — ABNORMAL HIGH (ref 1.005–1.030)
Squamous Epithelial / HPF: 0 /HPF (ref 0–5)
pH: 5 (ref 5.0–8.0)

## 2023-12-04 MED ORDER — AMLODIPINE BESYLATE 5 MG PO TABS
5.0000 mg | ORAL_TABLET | Freq: Every day | ORAL | Status: DC
  Administered 2023-12-04: 5 mg via ORAL
  Filled 2023-12-04: qty 1

## 2023-12-04 MED ORDER — HYDROMORPHONE HCL 1 MG/ML IJ SOLN
0.5000 mg | Freq: Once | INTRAMUSCULAR | Status: AC
  Administered 2023-12-04: 0.5 mg via INTRAVENOUS
  Filled 2023-12-04: qty 0.5

## 2023-12-04 MED ORDER — PREDNISONE 20 MG PO TABS
20.0000 mg | ORAL_TABLET | Freq: Two times a day (BID) | ORAL | Status: AC
  Administered 2023-12-04 – 2023-12-05 (×2): 20 mg via ORAL
  Filled 2023-12-04 (×2): qty 1

## 2023-12-04 MED ORDER — GUAIFENESIN ER 600 MG PO TB12
1200.0000 mg | ORAL_TABLET | Freq: Two times a day (BID) | ORAL | Status: DC
  Administered 2023-12-04 – 2023-12-05 (×2): 1200 mg via ORAL
  Filled 2023-12-04 (×3): qty 2

## 2023-12-04 MED ORDER — IOHEXOL 300 MG/ML  SOLN
100.0000 mL | Freq: Once | INTRAMUSCULAR | Status: AC | PRN
  Administered 2023-12-04: 100 mL via INTRAVENOUS

## 2023-12-04 MED ORDER — ENOXAPARIN SODIUM 40 MG/0.4ML IJ SOSY
40.0000 mg | PREFILLED_SYRINGE | INTRAMUSCULAR | Status: DC
  Administered 2023-12-04 – 2023-12-05 (×2): 40 mg via SUBCUTANEOUS
  Filled 2023-12-04 (×2): qty 0.4

## 2023-12-04 MED ORDER — ROSUVASTATIN CALCIUM 10 MG PO TABS
5.0000 mg | ORAL_TABLET | Freq: Every day | ORAL | Status: DC
  Administered 2023-12-04: 5 mg via ORAL
  Filled 2023-12-04 (×2): qty 1

## 2023-12-04 MED ORDER — ASPIRIN 81 MG PO CHEW
81.0000 mg | CHEWABLE_TABLET | Freq: Once | ORAL | Status: AC
  Administered 2023-12-04: 81 mg via ORAL
  Filled 2023-12-04: qty 1

## 2023-12-04 MED ORDER — TRAMADOL HCL 50 MG PO TABS
50.0000 mg | ORAL_TABLET | Freq: Three times a day (TID) | ORAL | Status: DC
  Administered 2023-12-04 – 2023-12-05 (×4): 50 mg via ORAL
  Filled 2023-12-04 (×4): qty 1

## 2023-12-04 MED ORDER — CYCLOBENZAPRINE HCL 10 MG PO TABS: 10.0000 mg | ORAL_TABLET | Freq: Three times a day (TID) | ORAL | Status: DC | PRN

## 2023-12-04 MED ORDER — SODIUM CHLORIDE 0.9 % IV BOLUS: 1000.0000 mL | Freq: Once | INTRAVENOUS | Status: DC

## 2023-12-04 MED ORDER — ONDANSETRON HCL 4 MG PO TABS: 4.0000 mg | ORAL_TABLET | Freq: Four times a day (QID) | ORAL | Status: DC | PRN

## 2023-12-04 MED ORDER — ONDANSETRON HCL 4 MG/2ML IJ SOLN: 4.0000 mg | Freq: Four times a day (QID) | INTRAMUSCULAR | Status: DC | PRN

## 2023-12-04 MED ORDER — ONDANSETRON HCL 4 MG/2ML IJ SOLN
4.0000 mg | Freq: Once | INTRAMUSCULAR | Status: AC
  Administered 2023-12-04: 4 mg via INTRAVENOUS
  Filled 2023-12-04: qty 2

## 2023-12-04 MED ORDER — BENZONATATE 100 MG PO CAPS: 200.0000 mg | ORAL_CAPSULE | Freq: Three times a day (TID) | ORAL | Status: DC | PRN

## 2023-12-04 MED ORDER — DOXYCYCLINE HYCLATE 100 MG PO TABS
100.0000 mg | ORAL_TABLET | Freq: Two times a day (BID) | ORAL | Status: DC
  Administered 2023-12-04 – 2023-12-05 (×2): 100 mg via ORAL
  Filled 2023-12-04 (×2): qty 1

## 2023-12-04 MED ORDER — MONTELUKAST SODIUM 10 MG PO TABS
10.0000 mg | ORAL_TABLET | Freq: Every day | ORAL | Status: DC
  Administered 2023-12-04: 10 mg via ORAL
  Filled 2023-12-04: qty 1

## 2023-12-04 MED ORDER — PROPRANOLOL HCL 10 MG PO TABS
40.0000 mg | ORAL_TABLET | Freq: Two times a day (BID) | ORAL | Status: DC
  Administered 2023-12-04 – 2023-12-05 (×2): 40 mg via ORAL
  Filled 2023-12-04 (×2): qty 4

## 2023-12-04 MED ORDER — HYDROMORPHONE HCL 1 MG/ML IJ SOLN: 0.5000 mg | INTRAMUSCULAR | Status: DC | PRN

## 2023-12-04 MED ORDER — PANTOPRAZOLE SODIUM 40 MG PO TBEC
40.0000 mg | DELAYED_RELEASE_TABLET | Freq: Every day | ORAL | Status: DC
  Administered 2023-12-05: 40 mg via ORAL
  Filled 2023-12-04: qty 1

## 2023-12-04 NOTE — ED Provider Notes (Signed)
 [facility] Provider Note    None    (approximate)   History   Abdominal Pain   HPI  Jason Hooper is a 58 y.o. male with history of kidney stones, gallstones who comes in with right lower abdominal pain.  Patient reports about 1 and half hours ago developing pain on his right side.  The pain is constant, severe associate with some nausea no vomiting.  Denies any chest pain, shortness of breath, urinary symptoms.   Physical Exam   Triage Vital Signs: ED Triage Vitals  Encounter Vitals Group     BP 12/04/23 1106 (!) 176/106     Systolic BP Percentile --      Diastolic BP Percentile --      Pulse Rate 12/04/23 1106 63     Resp 12/04/23 1106 20     Temp 12/04/23 1106 97.6 F (36.4 C)     Temp Source 12/04/23 1106 Oral     SpO2 12/04/23 1106 100 %     Weight 12/04/23 1104 208 lb (94.3 kg)     Height 12/04/23 1104 6\' 1"  (1.854 m)     Head Circumference --      Peak Flow --      Pain Score 12/04/23 1105 6     Pain Loc --      Pain Education --      Exclude from Growth Chart --     Most recent vital signs: Vitals:   12/04/23 1106  BP: (!) 176/106  Pulse: 63  Resp: 20  Temp: 97.6 F (36.4 C)  SpO2: 100%     General: Awake, no distress.  CV:  Good peripheral perfusion.  Resp:  Normal effort.  Abd:  No distention.  Soft and nontender with palpation but does report pain in his right lower abdomen. Other:     ED Results / Procedures / Treatments   Labs (all labs ordered are listed, but only abnormal results are displayed) Labs Reviewed  LIPASE, BLOOD  COMPREHENSIVE METABOLIC PANEL  CBC  URINALYSIS, ROUTINE W REFLEX MICROSCOPIC      RADIOLOGY I have reviewed the CT personally interpreted and no evidence of kidney stone  PROCEDURES:  Critical Care performed: No  Procedures   MEDICATIONS ORDERED IN ED: Medications  sodium chloride 0.9 % bolus 1,000 mL (has no administration in time range)  HYDROmorphone (DILAUDID)  injection 0.5 mg (0.5 mg Intravenous Given 12/04/23 1134)  ondansetron (ZOFRAN) injection 4 mg (4 mg Intravenous Given 12/04/23 1134)  iohexol (OMNIPAQUE) 300 MG/ML solution 100 mL (100 mLs Intravenous Contrast Given 12/04/23 1146)     IMPRESSION / MDM / ASSESSMENT AND PLAN / ED COURSE  I reviewed the triage vital signs and the nursing notes.   Patient's presentation is most consistent with acute presentation with potential threat to life or bodily function.   Differential is diverticulitis, appendicitis, abscess.  Will proceed with CT with contrast given the pain is on the right lower abdomen.  Patient given some IV Dilaudid, IV Zofran to help with symptoms  CBC shows elevated white count.  Lipase slightly elevated but he did not have upper abdominal pain to suggest pancreatitis.  CMP shows potassium of 3.4  Several small wedge-shaped areas of hypoenhancement in the right kidney, new since previous study. Differential diagnosis includes renal infarcts and pyelonephritis. Suggest correlation with urinalysis.   No evidence of ureteral calculi or hydronephrosis.   Cholelithiasis. No radiographic evidence of cholecystitis.   Colonic diverticulosis,  without radiographic evidence of diverticulitis.   Mild hepatic steatosis.  Patient has no right upper quadrant pain to suggest Coley lithiasis, cholecystitis.  He is aware of having these gallstones.  Discussed with nephrology who recommended discussing with vascular surgery.  Discussed with vascular surgery this is not a surgical issue but does recommend like a stroke workup to further evaluate including EKG, cholesterol, starting 81 mg of aspirin, echo, CT dissection protocol to evaluate the aorta.  Also recommend controlling blood pressure.  I did discuss with the hospitalist who is aware of this and will order the CT scan to be done tomorrow to help preserve his kidney function  EKG normal sinus rate of 74 without any ST elevation, T  wave version V3, normal intervals.  This based upon my interpretation.  The patient is on the cardiac monitor to evaluate for evidence of arrhythmia and/or significant heart rate changes.      FINAL CLINICAL IMPRESSION(S) / ED DIAGNOSES   Final diagnoses:  Kidney infarction [facility])     Rx / DC Orders   ED Discharge Orders     None        Note:  This document was prepared using Dragon voice recognition software and may include unintentional dictation errors.   Concha Se, MD 12/04/23 1520

## 2023-12-04 NOTE — Plan of Care (Signed)

## 2023-12-04 NOTE — H&P (Signed)
 History and Physical    Jason Hooper ZOX:[id number] DOB: 1965-12-18 DOA: 12/04/2023  PCP: Allegra Grana, FNP (Confirm with patient/family/NH records and if not entered, this has to be entered at [facility] point of entry) Patient coming from: Home  I have personally briefly reviewed patient's old medical records in [facility]  Chief Complaint:   HPI: Jason Hooper is a 58 y.o. male with medical history significant of HTN, chronic back pain, HLD, kidney stones, presented with sudden onset of abdominal pain.  Patient started to have dry cough 3 days ago went to see urgent care and was diagnosed with bronchitis and sent home with doxycycline and prednisone.  Yesterday he started to feel sharp like right lower abdominal pain radiating to right groin area, which he thought " might be appendicitis" overnight symptoms not improving and come to the hospital, denied any nauseous vomiting diarrhea no fever or chills.  ED Course: Afebrile, nontachycardic blood pressure 170/100, blood work showed K3.4, creatinine 2.8 WBC 15.9, hemoglobin 14.9.  CT abdomen Showed right-sided small wedge-shaped area of hypoenhancement in the right kidney, infarct versus pyelonephritis.  UA showed no signs of UTI but microscopic hematuria compatible with kidney infarct.  Vascular surgery and nephrology consulted, vascular surgery recommended CT abdomen pelvis dissection study to rule out large vessel atherosclerosis plaque dissection.  Review of Systems: As per HPI otherwise 14 point review of systems negative.    Past Medical History:  Diagnosis Date   Alcohol abuse    Allergy    Atypical mole 07/29/2022   Right pst base of neck - needs surgery   Bipolar disorder (HCC)    GERD (gastroesophageal reflux disease)    Kidney stones 2014    Past Surgical History:  Procedure Laterality Date   COLONOSCOPY WITH PROPOFOL N/A 01/24/2018   Procedure: COLONOSCOPY WITH PROPOFOL;  Surgeon: Wyline Mood, MD;  Location: [facility]  ENDOSCOPY;  Service: Gastroenterology;  Laterality: N/A;   ESOPHAGEAL DILATION     ESOPHAGOGASTRODUODENOSCOPY (EGD) WITH PROPOFOL N/A 01/24/2018   Procedure: ESOPHAGOGASTRODUODENOSCOPY (EGD) WITH PROPOFOL;  Surgeon: Wyline Mood, MD;  Location: [facility] ENDOSCOPY;  Service: Gastroenterology;  Laterality: N/A;   JOINT REPLACEMENT Right 07/16/2017   R great toe   TONSILECTOMY/ADENOIDECTOMY WITH MYRINGOTOMY       reports that he has never smoked. He quit smokeless tobacco use about 9 years ago.  His smokeless tobacco use included snuff. He reports current alcohol use of about 1.0 - 2.0 standard drink of alcohol per week. He reports that he does not use drugs.  No Known Allergies  Family History  Adopted: Yes  Family history unknown: Yes     Prior to Admission medications   Medication Sig Start Date End Date Taking? Authorizing Provider  amLODipine (NORVASC) 5 MG tablet Take 1 tablet (5 mg total) by mouth daily. 12/03/23  Yes Allegra Grana, FNP  cyclobenzaprine (FLEXERIL) 10 MG tablet Take 1  (10 mg) tablet by  mouth at bedtime as needed for muscle spasms 12/03/23  Yes Allegra Grana, FNP  doxycycline (VIBRA-TABS) 100 MG tablet Take 100 mg by mouth 2 (two) times daily. 11/30/23 12/10/23 Yes [provider]  meloxicam (MOBIC) 7.5 MG tablet TAKE 1 TABLET BY MOUTH EVERY DAY AS NEEDED FOR PAIN 08/30/23  Yes Allegra Grana, FNP  montelukast (SINGULAIR) 10 MG tablet TAKE 1 TABLET BY MOUTH EVERYDAY AT BEDTIME 07/28/22  Yes Allegra Grana, FNP  omeprazole (PRILOSEC) 20 MG capsule Take 1 capsule (20 mg total)  by mouth daily. 11/06/22  Yes Allegra Grana, FNP  predniSONE (DELTASONE) 20 MG tablet Take 20 mg by mouth 2 (two) times daily with a meal. 11/30/23 12/05/23 Yes [provider]  promethazine-dextromethorphan (PROMETHAZINE-DM) 6.25-15 MG/5ML syrup Take 5 mLs by mouth 4 (four) times daily as needed. 5 MLS BY MOUTH EVERY 6 (SIX) HOURS AS NEEDED FOR UP TO 7 DAYS 11/30/23  12/07/23 Yes [provider]  propranolol (INDERAL) 40 MG tablet Take 1 tablet (40 mg total) by mouth 2 (two) times daily. 12/03/23  Yes Allegra Grana, FNP  rosuvastatin (CRESTOR) 5 MG tablet TAKE 1 TABLET (5 MG TOTAL) BY MOUTH DAILY AT BEDTIME 08/06/23  Yes Allegra Grana, FNP  sildenafil (VIAGRA) 100 MG tablet TAKE ONE TABLET BY MOUTH DAILY AS NEEDED 05/22/22  Yes Worthy Rancher B, FNP  traMADol (ULTRAM) 50 MG tablet Take 1 tablet (50 mg total) by mouth 3 (three) times daily. 12/03/23  Yes Allegra Grana, FNP  mupirocin ointment (BACTROBAN) 2 % Apply 1 Application topically daily. With dressing changes Patient not taking: Reported on 12/04/2023 09/08/22   Deirdre Evener, MD    Physical Exam: Vitals:   12/04/23 1104 12/04/23 1106 12/04/23 1459  BP:  (!) 176/106 (!) 152/81  Pulse:  63 71  Resp:  20 17  Temp:  97.6 F (36.4 C)   TempSrc:  Oral   SpO2:  100% 97%  Weight: 94.3 kg    Height: 6\' 1"  (1.854 m)      Constitutional: NAD, calm, comfortable Vitals:   12/04/23 1104 12/04/23 1106 12/04/23 1459  BP:  (!) 176/106 (!) 152/81  Pulse:  63 71  Resp:  20 17  Temp:  97.6 F (36.4 C)   TempSrc:  Oral   SpO2:  100% 97%  Weight: 94.3 kg    Height: 6\' 1"  (1.854 m)     Eyes: PERRL, lids and conjunctivae normal ENMT: Mucous membranes are moist. Posterior pharynx clear of any exudate or lesions.Normal dentition.  Neck: normal, supple, no masses, no thyromegaly Respiratory: clear to auscultation bilaterally, no wheezing, no crackles. Normal respiratory effort. No accessory muscle use.  Cardiovascular: Regular rate and rhythm, no murmurs / rubs / gallops. No extremity edema. 2+ pedal pulses. No carotid bruits.  Abdomen: no tenderness, no masses palpated. No hepatosplenomegaly. Bowel sounds positive.  Musculoskeletal: no clubbing / cyanosis. No joint deformity upper and lower extremities. Good ROM, no contractures. Normal muscle tone.  Skin: no rashes, lesions, ulcers.  No induration Neurologic: CN 2-12 grossly intact. Sensation intact, DTR normal. Strength 5/5 in all 4.  Psychiatric: Normal judgment and insight. Alert and oriented x 3. Normal mood.    Labs on Admission: I have personally reviewed following labs and imaging studies  CBC: Recent Labs  Lab 12/04/23 1108  WBC 15.9*  HGB 14.9  HCT 42.5  MCV 92.8  PLT 344   Basic Metabolic Panel: Recent Labs  Lab 12/03/23 1051 12/04/23 1108  NA 142 140  K 3.8 3.4*  CL 101 100  CO2 33* 30  GLUCOSE 132* 138*  BUN 14 15  CREATININE 0.92 0.84  CALCIUM 9.2 9.3   GFR: Estimated Creatinine Clearance: 108.3 mL/min (by C-G formula based on SCr of 0.84 mg/dL). Liver Function Tests: Recent Labs  Lab 12/03/23 1051 12/04/23 1108  AST 26 29  ALT 31 40  ALKPHOS 88 76  BILITOT 0.7 0.7  PROT 6.9 7.6  ALBUMIN 4.4 4.2   Recent Labs  Lab 12/04/23  1108  LIPASE 55*   No results for input(s): "AMMONIA" in the last 168 hours. Coagulation Profile: No results for input(s): "INR", "PROTIME" in the last 168 hours. Cardiac Enzymes: No results for input(s): "CKTOTAL", "CKMB", "CKMBINDEX", "TROPONINI" in the last 168 hours. BNP (last 3 results) No results for input(s): "PROBNP" in the last 8760 hours. HbA1C: No results for input(s): "HGBA1C" in the last 72 hours. CBG: No results for input(s): "GLUCAP" in the last 168 hours. Lipid Profile: Recent Labs    12/03/23 1051  CHOL 139  HDL 60.80  LDLCALC 62  TRIG 84.0  CHOLHDL 2   Thyroid Function Tests: Recent Labs    12/03/23 1051  TSH 0.47   Anemia Panel: No results for input(s): "VITAMINB12", "FOLATE", "FERRITIN", "TIBC", "IRON", "RETICCTPCT" in the last 72 hours. Urine analysis:    Component Value Date/Time   COLORURINE AMBER (A) 12/04/2023 1342   APPEARANCEUR HAZY (A) 12/04/2023 1342   APPEARANCEUR Clear 04/05/2020 1353   LABSPEC >1.046 (H) 12/04/2023 1342   LABSPEC 1.024 08/31/2014 2035   PHURINE 5.0 12/04/2023 1342   GLUCOSEU  NEGATIVE 12/04/2023 1342   GLUCOSEU Negative 08/31/2014 2035   HGBUR NEGATIVE 12/04/2023 1342   BILIRUBINUR NEGATIVE 12/04/2023 1342   BILIRUBINUR Negative 04/05/2020 1353   BILIRUBINUR Negative 08/31/2014 2035   KETONESUR NEGATIVE 12/04/2023 1342   PROTEINUR 30 (A) 12/04/2023 1342   NITRITE NEGATIVE 12/04/2023 1342   LEUKOCYTESUR NEGATIVE 12/04/2023 1342   LEUKOCYTESUR Negative 08/31/2014 2035    Radiological Exams on Admission: CT ABDOMEN PELVIS W CONTRAST Result Date: 12/04/2023 CLINICAL DATA:  Acute right lower quadrant pain and right flank pain. EXAM: CT ABDOMEN AND PELVIS WITH CONTRAST TECHNIQUE: Multidetector CT imaging of the abdomen and pelvis was performed using the standard protocol following bolus administration of intravenous contrast. RADIATION DOSE REDUCTION: This exam was performed according to the departmental dose-optimization program which includes automated exposure control, adjustment of the mA and/or kV according to patient size and/or use of iterative reconstruction technique. CONTRAST:  OMNIPAQUE IOHEXOL 300 MG/ML  SOLN COMPARISON:  03/25/2019 FINDINGS: Lower Chest: No acute findings. Hepatobiliary: No suspicious hepatic masses identified. Mild diffuse hepatic steatosis. Small gallstone seen. No evidence of cholecystitis or biliary ductal dilatation. Pancreas:  No mass or inflammatory changes. Spleen: Within normal limits in size and appearance. Adrenals/Urinary Tract: A few renal cysts are seen bilaterally. No suspicious masses identified. No evidence of ureteral calculi or hydronephrosis. Several small wedge-shaped areas of hypoenhancement are seen in the right kidney which are new since previous study. Differential diagnosis includes renal infarcts and pyelonephritis. Stomach/Bowel: No evidence of obstruction, inflammatory process or abnormal fluid collections. Normal appendix visualized. Diverticulosis is seen mainly involving the descending and sigmoid colon, however  there is no evidence of diverticulitis. Vascular/Lymphatic: No pathologically enlarged lymph nodes. No acute vascular findings. Reproductive:  No mass or other significant abnormality. Other:  None. Musculoskeletal:  No suspicious bone lesions identified. IMPRESSION: Several small wedge-shaped areas of hypoenhancement in the right kidney, new since previous study. Differential diagnosis includes renal infarcts and pyelonephritis. Suggest correlation with urinalysis. No evidence of ureteral calculi or hydronephrosis. Cholelithiasis. No radiographic evidence of cholecystitis. Colonic diverticulosis, without radiographic evidence of diverticulitis. Mild hepatic steatosis. Electronically Signed   By: Danae Orleans M.D.   On: 12/04/2023 12:40    EKG: Independently reviewed.  Sinus rhythm, no acute ST changes.  Assessment/Plan Principal Problem:   Renal infarct [facility])  (please populate well all problems here in Problem List. (For example, if patient  is on BP meds at home and you resume or decide to hold them, it is a problem that needs to be her. Same for CAD, COPD, HLD and so on)  Acute right renal infarct, small -ED physician discussed case with vascular surgery who recommended CT angiogram dissection study to rule out large aorta atherosclerosis plaque.  Patient already received IV contrast today, CTA dissection study ordered for tomorrow -Telemonitoring x 24 hours to rule out PAF.  If negative recommend follow-up with cardiology for Zio patch study. -Echocardiogram -Agreed with aspirin -Outpatient EP study yesterday showed LDL less than 70.  Continue current family current atorvastatin  HTN, uncontrolled -Resume home BP meds including propranolol amlodipine  Acute bronchitis -Diagnosed outpatient 3 days ago, symptoms were improved as per patient.   -Plan to continue 2 more days of doxycycline and 1 more days of prednisone.  DVT prophylaxis: Lovenox Code Status: Full code Family Communication:  Wife at bedside Disposition Plan: Expect less than 2 midnight hospital stay Consults called: Curbside consult with vascular surgeon and nephrology Admission status: Telemetry observation   Emeline General MD [facility] Pager [phone]  12/04/2023, 3:51 PM

## 2023-12-04 NOTE — ED Triage Notes (Signed)
 Pt to ED for RLQ pain that radiates into R groin since 1.5 hr ago. States he thinks he either has kidney stone or appendix is "busted". Endorses R lower back pain as well. Denies urinary symptoms.

## 2023-12-05 ENCOUNTER — Observation Stay: Payer: BC Managed Care – PPO

## 2023-12-05 ENCOUNTER — Observation Stay (HOSPITAL_BASED_OUTPATIENT_CLINIC_OR_DEPARTMENT_OTHER)
Admit: 2023-12-05 | Discharge: 2023-12-05 | Disposition: A | Discharge status: Home / Self Care | Payer: BC Managed Care – PPO | Attending: Internal Medicine | Admitting: Internal Medicine

## 2023-12-05 DIAGNOSIS — I10 Essential (primary) hypertension: Secondary | ICD-10-CM

## 2023-12-05 DIAGNOSIS — I361 Nonrheumatic tricuspid (valve) insufficiency: Secondary | ICD-10-CM

## 2023-12-05 DIAGNOSIS — I340 Nonrheumatic mitral (valve) insufficiency: Secondary | ICD-10-CM

## 2023-12-05 DIAGNOSIS — R1030 Lower abdominal pain, unspecified: Secondary | ICD-10-CM | POA: Diagnosis not present

## 2023-12-05 DIAGNOSIS — K219 Gastro-esophageal reflux disease without esophagitis: Secondary | ICD-10-CM | POA: Diagnosis not present

## 2023-12-05 DIAGNOSIS — F319 Bipolar disorder, unspecified: Secondary | ICD-10-CM | POA: Diagnosis not present

## 2023-12-05 LAB — CBC
HCT: 37.5 % — ABNORMAL LOW (ref 39.0–52.0)
Hemoglobin: 13.6 g/dL (ref 13.0–17.0)
MCH: 32.9 pg (ref 26.0–34.0)
MCHC: 36.3 g/dL — ABNORMAL HIGH (ref 30.0–36.0)
MCV: 90.8 fL (ref 80.0–100.0)
Platelets: 257 10*3/uL (ref 150–400)
RBC: 4.13 MIL/uL — ABNORMAL LOW (ref 4.22–5.81)
RDW: 12.9 % (ref 11.5–15.5)
WBC: 12.3 10*3/uL — ABNORMAL HIGH (ref 4.0–10.5)
nRBC: 0 % (ref 0.0–0.2)

## 2023-12-05 LAB — GLUCOSE, CAPILLARY: Glucose-Capillary: 107 mg/dL — ABNORMAL HIGH (ref 70–99)

## 2023-12-05 LAB — ECHOCARDIOGRAM COMPLETE
AR max vel: 2.65 cm2
AV Area VTI: 3.33 cm2
AV Area mean vel: 2.86 cm2
AV Mean grad: 2 mmHg
AV Peak grad: 4.6 mmHg
Ao pk vel: 1.07 m/s
Area-P 1/2: 3.72 cm2
Height: 73 in
MV VTI: 2.23 cm2
S' Lateral: 3.4 cm
Weight: 3328 [oz_av]

## 2023-12-05 LAB — HIV ANTIBODY (ROUTINE TESTING W REFLEX): HIV Screen 4th Generation wRfx: NONREACTIVE

## 2023-12-05 MED ORDER — IOHEXOL 350 MG/ML SOLN
100.0000 mL | Freq: Once | INTRAVENOUS | Status: AC | PRN
  Administered 2023-12-05: 100 mL via INTRAVENOUS

## 2023-12-05 MED ORDER — AMLODIPINE BESYLATE 10 MG PO TABS
10.0000 mg | ORAL_TABLET | Freq: Every day | ORAL | Status: DC
Start: 2023-12-05 — End: 2023-12-05
  Administered 2023-12-05: 10 mg via ORAL
  Filled 2023-12-05: qty 1

## 2023-12-05 NOTE — Discharge Summary (Signed)
 Physician Discharge Summary   Patient: Jason Hooper MRN: [id number] DOB: [birth year]  Admit date:     12/04/2023  Discharge date: 12/05/23  Discharge Physician: Jason Hooper   PCP: Jason Grana, FNP   Recommendations at discharge:    PCP follow up in 1 week.  Discharge Diagnoses: Principal Problem:   Renal infarct Jason Hooper) Active Problems:   Bipolar disorder (HCC)   HTN (hypertension)   GERD (gastroesophageal reflux disease)  Resolved Problems:   * No resolved Hooper problems. Jason Hooper Course: As per HPI - Jason Hooper is a 58 y.o. male with medical history significant of HTN, chronic back pain, HLD, kidney stones, presented with sudden onset of abdominal pain.   Patient started to have dry cough 3 days ago went to see urgent care and was diagnosed with bronchitis and sent home with doxycycline and prednisone.  Yesterday he started to feel sharp like right lower abdominal pain radiating to right groin area, which he thought " might be appendicitis" overnight symptoms not improving and come to the Hooper, denied any nauseous vomiting diarrhea no fever or chills.   ED Course: Afebrile, nontachycardic blood pressure 170/100, blood work showed K3.4, creatinine 2.8 WBC 15.9, hemoglobin 14.9.  CT abdomen Showed right-sided small wedge-shaped area of hypoenhancement in the right kidney, infarct versus pyelonephritis.  UA showed no signs of UTI but microscopic hematuria compatible with kidney infarct.   Vascular surgery and nephrology consulted, vascular surgery recommended CT abdomen pelvis dissection study to rule out large vessel atherosclerosis plaque dissection.  CTA chest done today did not reveal significant atheromatous disease is seen involving the visualized vasculature. Right renal low densities noted on prior CT scan are no longer visualized. There is no definite evidence of renal infarction currently.  Abdominal pain improved. No nausea, able to tolerate diet.  Patient is hemodynamically stable to be discharged home with PCP, Urology follow up as he has prior renal stones. Patient and wife understand and agree with discharge plan.       Consultants: none Procedures performed: none  Disposition: Home Diet recommendation:  Discharge Diet Orders (From admission, onward)     Start     Ordered   12/05/23 0000  Diet - low sodium heart healthy        12/05/23 1531           Cardiac diet DISCHARGE MEDICATION: Allergies as of 12/05/2023   No Known Allergies      Medication List     TAKE these medications    amLODipine 5 MG tablet Commonly known as: NORVASC Take 1 tablet (5 mg total) by mouth daily.   cyclobenzaprine 10 MG tablet Commonly known as: FLEXERIL Take 1  (10 mg) tablet by  mouth at bedtime as needed for muscle spasms   doxycycline 100 MG tablet Commonly known as: VIBRA-TABS Take 100 mg by mouth 2 (two) times daily.   meloxicam 7.5 MG tablet Commonly known as: MOBIC TAKE 1 TABLET BY MOUTH EVERY DAY AS NEEDED FOR PAIN   montelukast 10 MG tablet Commonly known as: SINGULAIR TAKE 1 TABLET BY MOUTH EVERYDAY AT BEDTIME   mupirocin ointment 2 % Commonly known as: BACTROBAN Apply 1 Application topically daily. With dressing changes   omeprazole 20 MG capsule Commonly known as: PRILOSEC Take 1 capsule (20 mg total) by mouth daily.   predniSONE 20 MG tablet Commonly known as: DELTASONE Take 20 mg by mouth 2 (two) times daily with a meal.   promethazine-dextromethorphan  6.25-15 MG/5ML syrup Commonly known as: PROMETHAZINE-DM Take 5 mLs by mouth 4 (four) times daily as needed. 5 MLS BY MOUTH EVERY 6 (SIX) HOURS AS NEEDED FOR UP TO 7 DAYS   propranolol 40 MG tablet Commonly known as: INDERAL Take 1 tablet (40 mg total) by mouth 2 (two) times daily.   rosuvastatin 5 MG tablet Commonly known as: CRESTOR TAKE 1 TABLET (5 MG TOTAL) BY MOUTH DAILY AT BEDTIME   sildenafil 100 MG tablet Commonly known as:  VIAGRA TAKE ONE TABLET BY MOUTH DAILY AS NEEDED   traMADol 50 MG tablet Commonly known as: ULTRAM Take 1 tablet (50 mg total) by mouth 3 (three) times daily.        Discharge Exam: Filed Weights   12/04/23 1104  Weight: 94.3 kg      12/05/2023    8:16 AM 12/05/2023    4:08 AM 12/04/2023    8:12 PM  Vitals with BMI  Systolic 148 147 130  Diastolic 89 97 95  Pulse 73 61 69   Hooper - Middle aged Caucasian male, no apparent distress HEENT - PERRLA, EOMI, atraumatic head, non tender sinuses. Lung - Clear, no rales, rhonchi, wheezes. Heart - S1, S2 heard, no murmurs, rubs, trace pedal edema. Abdomen - Soft, non tender, bowel sounds good Neuro - Alert, awake and oriented x 3, non focal exam. Skin - Warm and dry.  Condition at discharge: stable  The results of significant diagnostics from this hospitalization (including imaging, microbiology, ancillary and laboratory) are listed below for reference.   Imaging Studies: ECHOCARDIOGRAM COMPLETE Result Date: 12/05/2023    ECHOCARDIOGRAM REPORT   Patient Name:   Jason Hooper Date of Exam: 12/05/2023 Medical Rec #:  [id number]    Height:       73.0 in Accession #:    [id number]   Weight:       208.0 lb Date of Birth:  1966-09-09    BSA:          2.188 m Patient Age:    57 years     BP:           147/97 mmHg Patient Gender: M            HR:           67 bpm. Exam Location:  [facility] Procedure: Cardiac Doppler, Color Doppler and 2D Echo (Both Spectral and Color            Flow Doppler were utilized during procedure). Indications:     Renal infarct [facility]) [[id number]]  History:         Patient has no prior history of Echocardiogram examinations.  Sonographer:     Jason Hooper Referring Phys:  [id number] Jason Hooper Diagnosing Phys: Jason Odea MD IMPRESSIONS  1. Left ventricular ejection fraction, by estimation, is 50 to 55%. Left ventricular ejection fraction by PLAX is 51 %. The left ventricle has low normal function. The left ventricle has no  regional wall motion abnormalities. Left ventricular diastolic parameters are consistent with Grade I diastolic dysfunction (impaired relaxation).  2. Right ventricular systolic function is normal. The right ventricular size is normal.  3. The mitral valve is normal in structure. Mild mitral valve regurgitation.  4. The aortic valve is tricuspid. Aortic valve regurgitation is not visualized.  5. The inferior vena cava is normal in size with greater than 50% respiratory variability, suggesting right atrial pressure of 3 mmHg. FINDINGS  Left Ventricle: Left ventricular ejection fraction,  by estimation, is 50 to 55%. Left ventricular ejection fraction by PLAX is 51 %. The left ventricle has low normal function. The left ventricle has no regional wall motion abnormalities. Strain imaging  was not performed. The left ventricular internal cavity size was normal in size. There is no left ventricular hypertrophy. Left ventricular diastolic parameters are consistent with Grade I diastolic dysfunction (impaired relaxation). Right Ventricle: The right ventricular size is normal. No increase in right ventricular wall thickness. Right ventricular systolic function is normal. Left Atrium: Left atrial size was normal in size. Right Atrium: Right atrial size was normal in size. Pericardium: There is no evidence of pericardial effusion. Mitral Valve: The mitral valve is normal in structure. Mild mitral valve regurgitation. MV peak gradient, 3.1 mmHg. The mean mitral valve gradient is 1.0 mmHg. Tricuspid Valve: The tricuspid valve is normal in structure. Tricuspid valve regurgitation is mild. Aortic Valve: The aortic valve is tricuspid. Aortic valve regurgitation is not visualized. Aortic valve mean gradient measures 2.0 mmHg. Aortic valve peak gradient measures 4.6 mmHg. Aortic valve area, by VTI measures 3.33 cm. Pulmonic Valve: The pulmonic valve was not well visualized. Pulmonic valve regurgitation is not visualized. Aorta: The  aortic root is normal in size and structure. Venous: The inferior vena cava is normal in size with greater than 50% respiratory variability, suggesting right atrial pressure of 3 mmHg. IAS/Shunts: No atrial level shunt detected by color flow Doppler. Additional Comments: 3D imaging was not performed.  LEFT VENTRICLE PLAX 2D LV EF:         Left            Diastology                ventricular     LV e' medial:    6.42 cm/s                ejection        LV E/e' medial:  10.9                fraction by     LV e' lateral:   10.40 cm/s                PLAX is 51      LV E/e' lateral: 6.7                %. LVIDd:         4.60 cm LVIDs:         3.40 cm LV PW:         0.90 cm LV IVS:        1.10 cm LVOT diam:     2.00 cm LV SV:         61 LV SV Index:   28 LVOT Area:     3.14 cm  RIGHT VENTRICLE RV Basal diam:  3.70 cm RV Mid diam:    3.40 cm RV S prime:     12.50 cm/s TAPSE (M-mode): 2.4 cm LEFT ATRIUM             Index        RIGHT ATRIUM           Index LA diam:        4.00 cm 1.83 cm/m   RA Area:     19.90 cm LA Vol (A2C):   56.7 ml 25.92 ml/m  RA Volume:   55.20 ml  25.23 ml/m LA Vol (A4C):   61.2 ml  27.97 ml/m LA Biplane Vol: 60.0 ml 27.42 ml/m  AORTIC VALVE                    PULMONIC VALVE AV Area (Vmax):    2.65 cm     PV Vmax:          1.03 m/s AV Area (Vmean):   2.86 cm     PV Peak grad:     4.2 mmHg AV Area (VTI):     3.33 cm     PR End Diast Vel: 4.08 msec AV Vmax:           107.00 cm/s AV Vmean:          66.400 cm/s AV VTI:            0.184 m AV Peak Grad:      4.6 mmHg AV Mean Grad:      2.0 mmHg LVOT Vmax:         90.20 cm/s LVOT Vmean:        60.500 cm/s LVOT VTI:          0.195 m LVOT/AV VTI ratio: 1.06  AORTA Ao Root diam: 2.70 cm Ao Asc diam:  3.30 cm MITRAL VALVE MV Area (PHT): 3.72 cm    SHUNTS MV Area VTI:   2.23 cm    Systemic VTI:  0.20 m MV Peak grad:  3.1 mmHg    Systemic Diam: 2.00 cm MV Mean grad:  1.0 mmHg MV Vmax:       0.88 m/s MV Vmean:      57.6 cm/s MV Decel Time: 204 msec MV E  velocity: 69.70 cm/s MV A velocity: 88.60 cm/s MV E/A ratio:  0.79 MV A Prime:    12.0 cm/s Jason Odea MD Electronically signed by Jason Odea MD Signature Date/Time: 12/05/2023/2:32:17 PM    Final    CT Angio Chest/Abd/Pel for Dissection W and/or W/WO Result Date: 12/05/2023 CLINICAL DATA:  Acute right lower quadrant abdominal pain. Possible renal infarctions. EXAM: CT ANGIOGRAPHY CHEST, ABDOMEN AND PELVIS TECHNIQUE: Non-contrast CT of the chest was initially obtained. Multidetector CT imaging through the chest, abdomen and pelvis was performed using the standard protocol during bolus administration of intravenous contrast. Multiplanar reconstructed images and MIPs were obtained and reviewed to evaluate the vascular anatomy. RADIATION DOSE REDUCTION: This exam was performed according to the departmental dose-optimization program which includes automated exposure control, adjustment of the mA and/or kV according to patient size and/or use of iterative reconstruction technique. CONTRAST:  OMNIPAQUE IOHEXOL 350 MG/ML SOLN COMPARISON:  December 03, 2022.  January 17, 2020.  March 25, 2019. FINDINGS: CTA CHEST FINDINGS Cardiovascular: Preferential opacification of the thoracic aorta. No evidence of thoracic aortic aneurysm or dissection. Normal heart size. No pericardial effusion. Mediastinum/Nodes: No enlarged mediastinal, hilar, or axillary lymph nodes. Thyroid gland, trachea, and esophagus demonstrate no significant findings. Lungs/Pleura: No pneumothorax or pleural effusion is noted. Stable 5 mm left lower lobe nodule is noted which can be considered benign at this point. Stable probable scarring is noted peripherally in right middle lobe. No acute pulmonary process is noted. Musculoskeletal: No chest wall abnormality. No acute or significant osseous findings. Review of the MIP images confirms the above findings. CTA ABDOMEN AND PELVIS FINDINGS VASCULAR Aorta: Normal caliber aorta without aneurysm,  dissection, vasculitis or significant stenosis. Celiac: Patent without evidence of aneurysm, dissection, vasculitis or significant stenosis. SMA: Patent without evidence of aneurysm, dissection, vasculitis or significant stenosis. Renals: Both  renal arteries are patent without evidence of aneurysm, dissection, vasculitis, fibromuscular dysplasia or significant stenosis. IMA: Patent without evidence of aneurysm, dissection, vasculitis or significant stenosis. Inflow: Patent without evidence of aneurysm, dissection, vasculitis or significant stenosis. Veins: No obvious venous abnormality within the limitations of this arterial phase study. Review of the MIP images confirms the above findings. NON-VASCULAR Hepatobiliary: Small gallstone. No biliary dilatation. Liver is unremarkable. Pancreas: Unremarkable. No pancreatic ductal dilatation or surrounding inflammatory changes. Spleen: Normal in size without focal abnormality. Adrenals/Urinary Tract: Adrenal glands appear normal. Exophytic left renal cyst is noted which was present on prior exam of 2020. Right renal cyst is noted as well which was present on prior exam. No further follow-up is required. No hydronephrosis or renal obstruction is noted. Urinary bladder is unremarkable. Which shaped cortical low density seen in right kidney on prior CT scan are no longer visualized. Stomach/Bowel: Stomach is within normal limits. Appendix appears normal. No evidence of bowel wall thickening, distention, or inflammatory changes. Lymphatic: No adenopathy. Reproductive: Prostate is unremarkable. Other: No abdominal wall hernia or abnormality. No abdominopelvic ascites. Musculoskeletal: No acute or significant osseous findings. Review of the MIP images confirms the above findings. IMPRESSION: No significant atheromatous disease is seen involving the visualized vasculature. Right renal low densities noted on prior CT scan are no longer visualized. There is no definite evidence of  renal infarction currently. Small solitary gallstone. Electronically Signed   By: Lupita Raider M.D.   On: 12/05/2023 12:34   CT ABDOMEN PELVIS W CONTRAST Result Date: 12/04/2023 CLINICAL DATA:  Acute right lower quadrant pain and right flank pain. EXAM: CT ABDOMEN AND PELVIS WITH CONTRAST TECHNIQUE: Multidetector CT imaging of the abdomen and pelvis was performed using the standard protocol following bolus administration of intravenous contrast. RADIATION DOSE REDUCTION: This exam was performed according to the departmental dose-optimization program which includes automated exposure control, adjustment of the mA and/or kV according to patient size and/or use of iterative reconstruction technique. CONTRAST:  OMNIPAQUE IOHEXOL 300 MG/ML  SOLN COMPARISON:  03/25/2019 FINDINGS: Lower Chest: No acute findings. Hepatobiliary: No suspicious hepatic masses identified. Mild diffuse hepatic steatosis. Small gallstone seen. No evidence of cholecystitis or biliary ductal dilatation. Pancreas:  No mass or inflammatory changes. Spleen: Within normal limits in size and appearance. Adrenals/Urinary Tract: A few renal cysts are seen bilaterally. No suspicious masses identified. No evidence of ureteral calculi or hydronephrosis. Several small wedge-shaped areas of hypoenhancement are seen in the right kidney which are new since previous study. Differential diagnosis includes renal infarcts and pyelonephritis. Stomach/Bowel: No evidence of obstruction, inflammatory process or abnormal fluid collections. Normal appendix visualized. Diverticulosis is seen mainly involving the descending and sigmoid colon, however there is no evidence of diverticulitis. Vascular/Lymphatic: No pathologically enlarged lymph nodes. No acute vascular findings. Reproductive:  No mass or other significant abnormality. Other:  None. Musculoskeletal:  No suspicious bone lesions identified. IMPRESSION: Several small wedge-shaped areas of  hypoenhancement in the right kidney, new since previous study. Differential diagnosis includes renal infarcts and pyelonephritis. Suggest correlation with urinalysis. No evidence of ureteral calculi or hydronephrosis. Cholelithiasis. No radiographic evidence of cholecystitis. Colonic diverticulosis, without radiographic evidence of diverticulitis. Mild hepatic steatosis. Electronically Signed   By: Danae Orleans M.D.   On: 12/04/2023 12:40    Microbiology: Results for orders placed or performed in visit on 04/05/20  Microscopic Examination     Status: None   Collection Time: 04/05/20  1:53 PM   Urine  Result Value Ref  Range Status   WBC, UA 0-5 0 - 5 /hpf Final   RBC, Urine None seen 0 - 2 /hpf Final   Epithelial Cells (non renal) None seen 0 - 10 /hpf Final   Bacteria, UA None seen None seen/Few Final    Labs: CBC: Recent Labs  Lab 12/04/23 1108 12/05/23 0537  WBC 15.9* 12.3*  HGB 14.9 13.6  HCT 42.5 37.5*  MCV 92.8 90.8  PLT 344 257   Basic Metabolic Panel: Recent Labs  Lab 12/03/23 1051 12/04/23 1108  NA 142 140  K 3.8 3.4*  CL 101 100  CO2 33* 30  GLUCOSE 132* 138*  BUN 14 15  CREATININE 0.92 0.84  CALCIUM 9.2 9.3   Liver Function Tests: Recent Labs  Lab 12/03/23 1051 12/04/23 1108  AST 26 29  ALT 31 40  ALKPHOS 88 76  BILITOT 0.7 0.7  PROT 6.9 7.6  ALBUMIN 4.4 4.2   CBG: Recent Labs  Lab 12/05/23 1229  GLUCAP 107*    Discharge time spent: 35 minutes.  Signed: Marcelino Duster, MD [facility] 12/05/2023

## 2023-12-06 ENCOUNTER — Telehealth: Payer: Self-pay | Admitting: Pharmacy Technician

## 2023-12-06 ENCOUNTER — Other Ambulatory Visit (HOSPITAL_COMMUNITY): Payer: Self-pay

## 2023-12-06 ENCOUNTER — Encounter: Payer: Self-pay | Admitting: Family

## 2023-12-06 ENCOUNTER — Other Ambulatory Visit: Payer: Self-pay | Admitting: Family

## 2023-12-06 NOTE — Telephone Encounter (Signed)
 Pharmacy Patient Advocate Encounter  Received notification from [facility] that Prior Authorization for TRAMADOL 50MG  TABLETS has been APPROVED from 12/06/2023 to 06/03/2024. Ran test claim, Copay is $5.00. This test claim was processed through [facility]- copay amounts may vary at other pharmacies due to pharmacy/plan contracts, or as the patient moves through the different stages of their insurance plan.   PA #/Case ID/Reference #: [id number]

## 2023-12-09 NOTE — Assessment & Plan Note (Signed)
 Chronic, stable.  Continue tramadol 150mg  daily, mobic 7.5mg  daily, Flexeril 10 mg nightly prn.

## 2023-12-09 NOTE — Assessment & Plan Note (Signed)
 Reassuring exam and symptoms are improving. Advised to continue prednisone, doxycycline and let me know if symptoms do not completely resolve.

## 2023-12-09 NOTE — Assessment & Plan Note (Addendum)
 Chronic, overall stable. Continue  amlodipine 5 mg, propranolol 40 mg daily.

## 2023-12-09 NOTE — Patient Instructions (Signed)
Nice to see you!   

## 2023-12-24 ENCOUNTER — Encounter: Payer: 59 | Admitting: Family Medicine

## 2024-03-03 ENCOUNTER — Encounter: Payer: Self-pay | Admitting: Family

## 2024-03-03 ENCOUNTER — Ambulatory Visit: Payer: BC Managed Care – PPO | Admitting: Family

## 2024-03-03 VITALS — BP 120/88 | HR 62 | Temp 97.8°F | Ht 73.0 in | Wt 206.4 lb

## 2024-03-03 DIAGNOSIS — R7309 Other abnormal glucose: Secondary | ICD-10-CM | POA: Diagnosis not present

## 2024-03-03 DIAGNOSIS — N280 Ischemia and infarction of kidney: Secondary | ICD-10-CM

## 2024-03-03 DIAGNOSIS — N281 Cyst of kidney, acquired: Secondary | ICD-10-CM

## 2024-03-03 DIAGNOSIS — I10 Essential (primary) hypertension: Secondary | ICD-10-CM | POA: Diagnosis not present

## 2024-03-03 DIAGNOSIS — N529 Male erectile dysfunction, unspecified: Secondary | ICD-10-CM

## 2024-03-03 DIAGNOSIS — M545 Low back pain, unspecified: Secondary | ICD-10-CM

## 2024-03-03 DIAGNOSIS — G8929 Other chronic pain: Secondary | ICD-10-CM

## 2024-03-03 DIAGNOSIS — Z13220 Encounter for screening for lipoid disorders: Secondary | ICD-10-CM

## 2024-03-03 DIAGNOSIS — Z136 Encounter for screening for cardiovascular disorders: Secondary | ICD-10-CM

## 2024-03-03 DIAGNOSIS — Z1211 Encounter for screening for malignant neoplasm of colon: Secondary | ICD-10-CM

## 2024-03-03 DIAGNOSIS — R899 Unspecified abnormal finding in specimens from other organs, systems and tissues: Secondary | ICD-10-CM | POA: Diagnosis not present

## 2024-03-03 DIAGNOSIS — K219 Gastro-esophageal reflux disease without esophagitis: Secondary | ICD-10-CM | POA: Diagnosis not present

## 2024-03-03 LAB — COMPREHENSIVE METABOLIC PANEL WITH GFR
ALT: 36 U/L (ref 0–53)
AST: 24 U/L (ref 0–37)
Albumin: 4.9 g/dL (ref 3.5–5.2)
Alkaline Phosphatase: 70 U/L (ref 39–117)
BUN: 11 mg/dL (ref 6–23)
CO2: 33 meq/L — ABNORMAL HIGH (ref 19–32)
Calcium: 9.8 mg/dL (ref 8.4–10.5)
Chloride: 100 meq/L (ref 96–112)
Creatinine, Ser: 1.03 mg/dL (ref 0.40–1.50)
GFR: 79.9 mL/min (ref >60.00)
Glucose, Bld: 105 mg/dL — ABNORMAL HIGH (ref 70–99)
Potassium: 4.4 meq/L (ref 3.5–5.1)
Sodium: 139 meq/L (ref 135–145)
Total Bilirubin: 0.7 mg/dL (ref 0.2–1.2)
Total Protein: 7.3 g/dL (ref 6.0–8.3)

## 2024-03-03 LAB — URINALYSIS, ROUTINE W REFLEX MICROSCOPIC
Bilirubin Urine: NEGATIVE
Hgb urine dipstick: NEGATIVE
Leukocytes,Ua: NEGATIVE
Nitrite: NEGATIVE
Specific Gravity, Urine: 1.025 (ref 1.000–1.030)
Total Protein, Urine: NEGATIVE
Urine Glucose: NEGATIVE
Urobilinogen, UA: 0.2 (ref 0.0–1.0)
pH: 6 (ref 5.0–8.0)

## 2024-03-03 LAB — POCT GLYCOSYLATED HEMOGLOBIN (HGB A1C): Hemoglobin A1C: 5.5 % (ref 4.0–5.6)

## 2024-03-03 LAB — CBC WITH DIFFERENTIAL/PLATELET
Basophils Absolute: 0 10*3/uL (ref 0.0–0.1)
Basophils Relative: 0.7 % (ref 0.0–3.0)
Eosinophils Absolute: 0.1 10*3/uL (ref 0.0–0.7)
Eosinophils Relative: 2.4 % (ref 0.0–5.0)
HCT: 42.7 % (ref 39.0–52.0)
Hemoglobin: 14.5 g/dL (ref 13.0–17.0)
Lymphocytes Relative: 21.1 % (ref 12.0–46.0)
Lymphs Abs: 1.2 10*3/uL (ref 0.7–4.0)
MCHC: 34 g/dL (ref 30.0–36.0)
MCV: 93.8 fl (ref 78.0–100.0)
Monocytes Absolute: 0.6 10*3/uL (ref 0.1–1.0)
Monocytes Relative: 11.4 % (ref 3.0–12.0)
Neutro Abs: 3.6 10*3/uL (ref 1.4–7.7)
Neutrophils Relative %: 64.4 % (ref 43.0–77.0)
Platelets: 263 10*3/uL (ref 150.0–400.0)
RBC: 4.55 Mil/uL (ref 4.22–5.81)
RDW: 13.1 % (ref 11.5–15.5)
WBC: 5.6 10*3/uL (ref 4.0–10.5)

## 2024-03-03 LAB — MICROALBUMIN / CREATININE URINE RATIO
Creatinine,U: 238.3 mg/dL
Microalb Creat Ratio: 5.9 mg/g (ref 0.0–30.0)
Microalb, Ur: 1.4 mg/dL (ref 0.0–1.9)

## 2024-03-03 MED ORDER — TRAMADOL HCL 50 MG PO TABS: 50.0000 mg | ORAL_TABLET | Freq: Three times a day (TID) | ORAL | 2 refills | Status: DC

## 2024-03-03 MED ORDER — ROSUVASTATIN CALCIUM 5 MG PO TABS
5.0000 mg | ORAL_TABLET | Freq: Every day | ORAL | 3 refills | Status: AC
Start: 2024-03-03 — End: ?

## 2024-03-03 MED ORDER — OMEPRAZOLE 20 MG PO CPDR: 20.0000 mg | DELAYED_RELEASE_CAPSULE | Freq: Every day | ORAL | 2 refills | Status: DC

## 2024-03-03 MED ORDER — TADALAFIL 10 MG PO TABS: 10.0000 mg | ORAL_TABLET | ORAL | 1 refills | Status: DC | PRN

## 2024-03-03 NOTE — Assessment & Plan Note (Signed)
 Trial stop Viagra .  Start Cialis 10 mg every 48 hours as needed.  Discussed side effects.

## 2024-03-03 NOTE — Progress Notes (Signed)
 Assessment & Plan:   Elevated glucose -     POCT glycosylated hemoglobin (Hb A1C)  Gastroesophageal reflux disease, unspecified whether esophagitis present -     Omeprazole ; Take 1 capsule (20 mg total) by mouth daily.  Dispense: 90 capsule; Refill: 2  Chronic left-sided low back pain without sciatica Assessment & Plan: Chronic, stable.  Continue tramadol  150mg  daily, mobic  7.5mg  daily, Flexeril  10 mg nightly prn.   Orders: -     traMADol  HCl; Take 1 tablet (50 mg total) by mouth 3 (three) times daily.  Dispense: 90 tablet; Refill: 2  Encounter for lipid screening for cardiovascular disease -     Rosuvastatin  Calcium ; Take 1 tablet (5 mg total) by mouth daily.  Dispense: 90 tablet; Refill: 3  Essential hypertension -     Comprehensive metabolic panel with GFR  Screen for colon cancer -     Ambulatory referral to Gastroenterology  Abnormal laboratory test -     Urinalysis, Routine w reflex microscopic -     Microalbumin / creatinine urine ratio -     CBC with Differential/Platelet  Erectile dysfunction, unspecified erectile dysfunction type Assessment & Plan: Trial stop Viagra .  Start Cialis 10 mg every 48 hours as needed.  Discussed side effects.   Orders: -     Tadalafil; Take 1 tablet (10 mg total) by mouth every other day as needed for erectile dysfunction.  Dispense: 15 tablet; Refill: 1  Renal cyst, right Assessment & Plan: Reviewed hospitalization for suspected kidney infarction.  Reviewed CTA without definitive evidence of renal infarction.  Pending urinalysis.   Renal infarct [facility])  Primary hypertension Assessment & Plan: Chronic, overall stable. Continue  amlodipine  5 mg, propranolol  40 mg daily.      Return precautions given.   Risks, benefits, and alternatives of the medications and treatment plan prescribed today were discussed, and patient expressed understanding.   Education regarding symptom management and diagnosis given to patient on AVS  either electronically or printed.  No follow-ups on file.  Bascom Bossier, FNP  Subjective:    [name], male    DOB: 10/17/1965, 58 y.o.   MRN: [id number]  CC: Jason Hooper is a 58 y.o. male who presents today for follow up.   HPI: Feels well today.  No recurrence of abdominal pain, hematuria.  He did not see urology.   Chronic low back pain is at baseline.  He is doing well on tramadol  and feels adequate for his pain   Hospitalized 12/04/2023 for suspected kidney infarction.   12/04/23 CT abdomen Showed right-sided small wedge-shaped area of hypoenhancement in the right kidney, infarct versus pyelonephritis.  UA showed no signs of UTI but microscopic hematuria compatible with kidney infarct. Mild hepatic steatosis, cholelithiasis.  No evidence of ureteral calculi or hydronephrosis  12/05/23 CTA No significant atheromatous disease is seen involving the visualized vasculature.Right renal low densities noted on prior CT scan are no longer visualized. There is no definite evidence of renal infarction currently.    UA 06/02/2024 21-50 RBC Discharged with urology follow-up  He asked if he can take a different medication other than Viagra .  It is not as effective for ED.  No history of nitrate use.  No history of MI  Allergies: Patient has no known allergies. Current Outpatient Medications on File Prior to Visit  Medication Sig Dispense Refill   amLODipine  (NORVASC ) 5 MG tablet Take 1 tablet (5 mg total) by mouth daily. 90 tablet 3  cyclobenzaprine  (FLEXERIL ) 10 MG tablet Take 1  (10 mg) tablet by  mouth at bedtime as needed for muscle spasms 90 tablet 1   meloxicam  (MOBIC ) 7.5 MG tablet TAKE 1 TABLET BY MOUTH EVERY DAY AS NEEDED FOR PAIN 90 tablet 1   montelukast  (SINGULAIR ) 10 MG tablet TAKE 1 TABLET BY MOUTH EVERYDAY AT BEDTIME 90 tablet 3   propranolol  (INDERAL ) 40 MG tablet Take 1 tablet (40 mg total) by mouth 2 (two) times daily. 180 tablet 3   No current  facility-administered medications on file prior to visit.    Review of Systems  Constitutional:  Negative for chills and fever.  Respiratory:  Negative for cough.   Cardiovascular:  Negative for chest pain and palpitations.  Gastrointestinal:  Negative for abdominal pain, nausea and vomiting.      Objective:    BP 120/88   Pulse 62   Temp 97.8 F (36.6 C)   Ht 6\' 1"  (1.854 m)   Wt 206 lb 6.4 oz (93.6 kg)   SpO2 99%   BMI 27.23 kg/m  BP Readings from Last 3 Encounters:  03/03/24 120/88  12/05/23 (!) 148/89  12/03/23 138/80   Wt Readings from Last 3 Encounters:  03/03/24 206 lb 6.4 oz (93.6 kg)  12/04/23 208 lb (94.3 kg)  12/03/23 208 lb 6.4 oz (94.5 kg)    Physical Exam Vitals reviewed.  Constitutional:      Appearance: He is well-developed.  Cardiovascular:     Rate and Rhythm: Regular rhythm.     Heart sounds: Normal heart sounds.  Pulmonary:     Effort: Pulmonary effort is normal. No respiratory distress.     Breath sounds: Normal breath sounds. No wheezing, rhonchi or rales.  Skin:    General: Skin is warm and dry.  Neurological:     Mental Status: He is alert.  Psychiatric:        Speech: Speech normal.        Behavior: Behavior normal.

## 2024-03-03 NOTE — Assessment & Plan Note (Signed)
 Chronic, overall stable. Continue  amlodipine 5 mg, propranolol 40 mg daily.

## 2024-03-03 NOTE — Assessment & Plan Note (Signed)
 Reviewed hospitalization for suspected kidney infarction.  Reviewed CTA without definitive evidence of renal infarction.  Pending urinalysis.

## 2024-03-03 NOTE — Assessment & Plan Note (Signed)
 Chronic, stable.  Continue tramadol 150mg  daily, mobic 7.5mg  daily, Flexeril 10 mg nightly prn.

## 2024-03-21 ENCOUNTER — Ambulatory Visit: Payer: Self-pay | Admitting: Family

## 2024-03-31 ENCOUNTER — Other Ambulatory Visit: Payer: Self-pay | Admitting: Family

## 2024-03-31 DIAGNOSIS — G8929 Other chronic pain: Secondary | ICD-10-CM

## 2024-03-31 DIAGNOSIS — M545 Low back pain, unspecified: Secondary | ICD-10-CM

## 2024-05-02 ENCOUNTER — Other Ambulatory Visit: Payer: Self-pay | Admitting: Family

## 2024-05-02 DIAGNOSIS — N529 Male erectile dysfunction, unspecified: Secondary | ICD-10-CM

## 2024-05-28 ENCOUNTER — Other Ambulatory Visit: Payer: Self-pay | Admitting: Family

## 2024-05-28 DIAGNOSIS — G8929 Other chronic pain: Secondary | ICD-10-CM

## 2024-06-02 ENCOUNTER — Ambulatory Visit: Admitting: Family

## 2024-06-09 ENCOUNTER — Encounter: Payer: Self-pay | Admitting: Family

## 2024-06-09 ENCOUNTER — Ambulatory Visit: Admitting: Family

## 2024-06-09 VITALS — BP 122/80 | HR 67 | Temp 97.8°F | Ht 73.0 in | Wt 210.2 lb

## 2024-06-09 DIAGNOSIS — M545 Low back pain, unspecified: Secondary | ICD-10-CM

## 2024-06-09 DIAGNOSIS — G8929 Other chronic pain: Secondary | ICD-10-CM | POA: Diagnosis not present

## 2024-06-09 DIAGNOSIS — I10 Essential (primary) hypertension: Secondary | ICD-10-CM

## 2024-06-09 MED ORDER — TRAMADOL HCL 50 MG PO TABS: 50.0000 mg | ORAL_TABLET | Freq: Three times a day (TID) | ORAL | 2 refills | Status: DC

## 2024-06-09 NOTE — Patient Instructions (Signed)
 A couple of points in regards to meloxicam  ( Mobic ) -  This medication is not intended for daily , long term use. It is a potent anti inflammatory ( NSAID), and my intention is for you take as needed for moderate to severe pain. If you find yourself using daily, please let me know.   Please takes Mobic  ( meloxicam ) with FOOD since it is an anti-inflammatory as it can cause a GI bleed or ulcer. If you have a history of GI bleed or ulcer, please do NOT take.  Do no take over the counter aleve, motrin, advil, goody's powder for pain as they are also NSAIDs, and they are  in the same class as Mobic    It is reasonable to try Tylenol  arthritis 650 mg tablet instead of meloxicam  to see if helpful for pain.  Tylenol  has a safer profile in regards to GI side effects.

## 2024-06-09 NOTE — Progress Notes (Signed)
 Assessment & Plan:  Primary hypertension Assessment & Plan: Chronic, overall stable. Continue  amlodipine  5 mg, propranolol  40 mg daily.   Chronic left-sided low back pain without sciatica Assessment & Plan: Chronic, stable.  Continue tramadol  150mg  daily,  Flexeril  10 mg nightly prn.  Counseled on gastritis, PUD risk with prolonged use of meloxicam .  Encouraged him to consider Tylenol  arthritis or as needed use of meloxicam .  He will remain on omeprazole .  He stated he will try to take mobic  as needed.  Close follow-up  Orders: -     traMADol  HCl; Take 1 tablet (50 mg total) by mouth 3 (three) times daily.  Dispense: 90 tablet; Refill: 2     Return precautions given.   Risks, benefits, and alternatives of the medications and treatment plan prescribed today were discussed, and patient expressed understanding.   Education regarding symptom management and diagnosis given to patient on AVS either electronically or printed.  Return in about 3 months (around 09/09/2024).  Jason Northern, FNP  Subjective:    [name], male    DOB: 07-07-1966, 58 y.o.   MRN: [id number]  CC: Jason Hooper is a 58 y.o. male who presents today for follow up.   HPI: Feels well today.  No new complaints.  Compliant with tramadol  50 mg 3 times daily which is effective for low back pain.  He takes Flexeril  at bedtime as needed. He has been taken meloxicam  7.5 mg daily.  He remains compliant with omeprazole  20 g daily.  Colonoscopy is scheduled    Allergies: Patient has no known allergies. Current Outpatient Medications on File Prior to Visit  Medication Sig Dispense Refill   amLODipine  (NORVASC ) 5 MG tablet Take 1 tablet (5 mg total) by mouth daily. 90 tablet 3   cyclobenzaprine  (FLEXERIL ) 10 MG tablet TAKE 1 (10 MG) TABLET BY MOUTH AT BEDTIME AS NEEDED FOR MUSCLE SPASMS 90 tablet 1   meloxicam  (MOBIC ) 7.5 MG tablet TAKE 1 TABLET BY MOUTH EVERY DAY AS NEEDED FOR PAIN 90 tablet 1    montelukast  (SINGULAIR ) 10 MG tablet TAKE 1 TABLET BY MOUTH EVERYDAY AT BEDTIME 90 tablet 3   omeprazole  (PRILOSEC) 20 MG capsule Take 1 capsule (20 mg total) by mouth daily. 90 capsule 2   propranolol  (INDERAL ) 40 MG tablet Take 1 tablet (40 mg total) by mouth 2 (two) times daily. 180 tablet 3   rosuvastatin  (CRESTOR ) 5 MG tablet Take 1 tablet (5 mg total) by mouth daily. 90 tablet 3   tadalafil  (CIALIS ) 10 MG tablet TAKE 1 TABLET (10 MG TOTAL) BY MOUTH EVERY OTHER DAY AS NEEDED FOR ERECTILE DYSFUNCTION. 15 tablet 6   No current facility-administered medications on file prior to visit.    Review of Systems  Constitutional:  Negative for chills and fever.  Respiratory:  Negative for cough.   Cardiovascular:  Negative for chest pain and palpitations.  Gastrointestinal:  Negative for nausea and vomiting.  Musculoskeletal:  Positive for back pain (chronic, stable.).      Objective:    BP 122/80   Pulse 67   Temp 97.8 F (36.6 C) (Oral)   Ht 6' 1 (1.854 m)   Wt 210 lb 3.2 oz (95.3 kg)   SpO2 99%   BMI 27.73 kg/m  BP Readings from Last 3 Encounters:  06/09/24 122/80  03/03/24 120/88  12/05/23 (!) 148/89   Wt Readings from Last 3 Encounters:  06/09/24 210 lb 3.2 oz (95.3 kg)  03/03/24 206  lb 6.4 oz (93.6 kg)  12/04/23 208 lb (94.3 kg)    Physical Exam Vitals reviewed.  Constitutional:      Appearance: He is well-developed.  Cardiovascular:     Rate and Rhythm: Regular rhythm.     Heart sounds: Normal heart sounds.  Pulmonary:     Effort: Pulmonary effort is normal. No respiratory distress.     Breath sounds: Normal breath sounds. No wheezing, rhonchi or rales.  Skin:    General: Skin is warm and dry.  Neurological:     Mental Status: He is alert.  Psychiatric:        Speech: Speech normal.        Behavior: Behavior normal.

## 2024-06-09 NOTE — Assessment & Plan Note (Signed)
 Chronic, stable.  Continue tramadol  150mg  daily,  Flexeril  10 mg nightly prn.  Counseled on gastritis, PUD risk with prolonged use of meloxicam .  Encouraged him to consider Tylenol  arthritis or as needed use of meloxicam .  He will remain on omeprazole .  He stated he will try to take mobic  as needed.  Close follow-up

## 2024-06-09 NOTE — Assessment & Plan Note (Signed)
 Chronic, overall stable. Continue  amlodipine 5 mg, propranolol 40 mg daily.

## 2024-06-23 ENCOUNTER — Ambulatory Visit
Admission: RE | Admit: 2024-06-23 | Discharge: 2024-06-23 | Disposition: A | Discharge status: Home / Self Care | Attending: Gastroenterology | Admitting: Gastroenterology

## 2024-06-23 ENCOUNTER — Ambulatory Visit: Admitting: Anesthesiology

## 2024-06-23 ENCOUNTER — Encounter
Admission: RE | Disposition: A | Discharge status: Home / Self Care | Payer: Self-pay | Source: Home / Self Care | Attending: Gastroenterology

## 2024-06-23 ENCOUNTER — Encounter: Payer: Self-pay | Admitting: Gastroenterology

## 2024-06-23 DIAGNOSIS — I10 Essential (primary) hypertension: Secondary | ICD-10-CM | POA: Insufficient documentation

## 2024-06-23 DIAGNOSIS — M1990 Unspecified osteoarthritis, unspecified site: Secondary | ICD-10-CM | POA: Diagnosis not present

## 2024-06-23 DIAGNOSIS — Z1211 Encounter for screening for malignant neoplasm of colon: Secondary | ICD-10-CM | POA: Insufficient documentation

## 2024-06-23 DIAGNOSIS — Z79899 Other long term (current) drug therapy: Secondary | ICD-10-CM | POA: Insufficient documentation

## 2024-06-23 DIAGNOSIS — D124 Benign neoplasm of descending colon: Secondary | ICD-10-CM | POA: Insufficient documentation

## 2024-06-23 DIAGNOSIS — K5730 Diverticulosis of large intestine without perforation or abscess without bleeding: Secondary | ICD-10-CM | POA: Insufficient documentation

## 2024-06-23 DIAGNOSIS — K219 Gastro-esophageal reflux disease without esophagitis: Secondary | ICD-10-CM | POA: Insufficient documentation

## 2024-06-23 DIAGNOSIS — F319 Bipolar disorder, unspecified: Secondary | ICD-10-CM | POA: Insufficient documentation

## 2024-06-23 HISTORY — PX: COLONOSCOPY: SHX5424

## 2024-06-23 SURGERY — COLONOSCOPY
Anesthesia: General

## 2024-06-23 MED ORDER — SODIUM CHLORIDE 0.9 % IV SOLN
INTRAVENOUS | Status: DC
  Administered 2024-06-23: 20 mL/h via INTRAVENOUS

## 2024-06-23 MED ORDER — LIDOCAINE HCL (CARDIAC) PF 100 MG/5ML IV SOSY
PREFILLED_SYRINGE | INTRAVENOUS | Status: DC | PRN
  Administered 2024-06-23: 60 mg via INTRAVENOUS

## 2024-06-23 MED ORDER — PROPOFOL 500 MG/50ML IV EMUL
INTRAVENOUS | Status: DC | PRN
  Administered 2024-06-23: 75 ug/kg/min via INTRAVENOUS

## 2024-06-23 MED ORDER — LIDOCAINE HCL (PF) 2 % IJ SOLN
INTRAMUSCULAR | Status: AC
  Filled 2024-06-23: qty 5

## 2024-06-23 MED ORDER — DEXMEDETOMIDINE HCL IN NACL 80 MCG/20ML IV SOLN
INTRAVENOUS | Status: DC | PRN
  Administered 2024-06-23: 8 ug via INTRAVENOUS
  Administered 2024-06-23: 12 ug via INTRAVENOUS

## 2024-06-23 MED ORDER — PROPOFOL 10 MG/ML IV BOLUS
INTRAVENOUS | Status: DC | PRN
Start: 2024-06-23 — End: 2024-06-23
  Administered 2024-06-23 (×2): 50 mg via INTRAVENOUS

## 2024-06-23 NOTE — H&P (Signed)
 Jason Hooper , MD [address], [place], [place], [address] Phone: [phone] Fax: [phone]  Primary Care Physician:  Dineen Rollene MATSU, FNP   Pre-Procedure History & Physical: HPI:  Jason Hooper is a 58 y.o. male is here for an colonoscopy.   Past Medical History:  Diagnosis Date   Alcohol abuse    Allergy    Atypical mole 07/29/2022   Right pst base of neck - needs surgery   Bipolar disorder (HCC)    GERD (gastroesophageal reflux disease)    Kidney stones 2014    Past Surgical History:  Procedure Laterality Date   COLONOSCOPY WITH PROPOFOL  N/A 01/24/2018   Procedure: COLONOSCOPY WITH PROPOFOL ;  Surgeon: Hooper Ruel, MD;  Location: [facility] ENDOSCOPY;  Service: Gastroenterology;  Laterality: N/A;   ESOPHAGEAL DILATION     ESOPHAGOGASTRODUODENOSCOPY (EGD) WITH PROPOFOL  N/A 01/24/2018   Procedure: ESOPHAGOGASTRODUODENOSCOPY (EGD) WITH PROPOFOL ;  Surgeon: Hooper Ruel, MD;  Location: [facility] ENDOSCOPY;  Service: Gastroenterology;  Laterality: N/A;   JOINT REPLACEMENT Right 07/16/2017   R great toe   TONSILECTOMY/ADENOIDECTOMY WITH MYRINGOTOMY      Prior to Admission medications   Medication Sig Start Date End Date Taking? Authorizing Provider  amLODipine  (NORVASC ) 5 MG tablet Take 1 tablet (5 mg total) by mouth daily. 12/03/23  Yes Dineen Rollene MATSU, FNP  cyclobenzaprine  (FLEXERIL ) 10 MG tablet TAKE 1 (10 MG) TABLET BY MOUTH AT BEDTIME AS NEEDED FOR MUSCLE SPASMS 05/29/24  Yes Dineen Rollene MATSU, FNP  meloxicam  (MOBIC ) 7.5 MG tablet TAKE 1 TABLET BY MOUTH EVERY DAY AS NEEDED FOR PAIN 03/31/24  Yes Dineen Rollene MATSU, FNP  montelukast  (SINGULAIR ) 10 MG tablet TAKE 1 TABLET BY MOUTH EVERYDAY AT BEDTIME 12/06/23  Yes Dineen Rollene MATSU, FNP  omeprazole  (PRILOSEC) 20 MG capsule Take 1 capsule (20 mg total) by mouth daily. 03/03/24  Yes Arnett, Rollene MATSU,  FNP  propranolol  (INDERAL ) 40 MG tablet Take 1 tablet (40 mg total) by mouth 2 (two) times daily. 12/03/23  Yes Arnett, Rollene MATSU, FNP  rosuvastatin  (CRESTOR ) 5 MG tablet Take 1 tablet (5 mg total) by mouth daily. 03/03/24  Yes Arnett, Rollene MATSU, FNP  tadalafil  (CIALIS ) 10 MG tablet TAKE 1 TABLET (10 MG TOTAL) BY MOUTH EVERY OTHER DAY AS NEEDED FOR ERECTILE DYSFUNCTION. 05/03/24  Yes Dineen Rollene MATSU, FNP  traMADol  (ULTRAM ) 50 MG tablet Take 1 tablet (50 mg total) by mouth 3 (three) times daily. 06/09/24  Yes Dineen Rollene MATSU, FNP    Allergies as of 06/07/2024   (No Known Allergies)    Family History  Adopted: Yes  Family history unknown: Yes    Social History   Socioeconomic History   Marital status: Single    Spouse name: Not on file   Number of children: Not on file   Years of education: Not on file   Highest education level: Some college, no degree  Occupational History   Not on file  Tobacco Use   Smoking status: Never   Smokeless tobacco: Former    Types: Snuff    Quit date: 04/26/2014  Vaping Use   Vaping status: Never Used  Substance and Sexual Activity   Alcohol use: Yes    Alcohol/week: 1.0 - 2.0 standard drink of alcohol    Types: 1 - 2 Cans of beer per week    Comment: occasional   Drug use: No   Sexual activity: Yes  Other Topics Concern   Not on file  Social History  Narrative   Adopted.    [employer]- works.    Social Drivers of Corporate investment banker Strain: Low Risk  (06/08/2024)   Overall Financial Resource Strain (CARDIA)    Difficulty of Paying Living Expenses: Not very hard  Food Insecurity: No Food Insecurity (06/08/2024)   Hunger Vital Sign    Worried About Running Out of Food in the Last Year: Never true    Ran Out of Food in the Last Year: Never true  Transportation Needs: No Transportation Needs (06/08/2024)   PRAPARE - Administrator, Civil Service (Medical): No    Lack of Transportation (Non-Medical): No  Physical Activity:  Sufficiently Active (06/08/2024)   Exercise Vital Sign    Days of Exercise per Week: 5 days    Minutes of Exercise per Session: 60 min  Stress: No Stress Concern Present (06/08/2024)   [employer] of Occupational Health - Occupational Stress Questionnaire    Feeling of Stress: Not at all  Social Connections: Unknown (06/08/2024)   Social Connection and Isolation Panel    Frequency of Communication with Friends and Family: Twice a week    Frequency of Social Gatherings with Friends and Family: Once a week    Attends Religious Services: Patient declined    Database administrator or Organizations: Patient declined    Attends Banker Meetings: Not on file    Marital Status: Patient declined  Intimate Partner Violence: Not At Risk (12/04/2023)   Humiliation, Afraid, Rape, and Kick questionnaire    Fear of Current or Ex-Partner: No    Emotionally Abused: No    Physically Abused: No    Sexually Abused: No    Review of Systems: See HPI, otherwise negative ROS  Physical Exam: BP 139/86   Pulse 70   Temp (!) 96.2 F (35.7 C) (Temporal)   Resp 20   Ht 6' 1 (1.854 m)   Wt 93.4 kg   SpO2 100%   BMI 27.18 kg/m  General:   Alert,  pleasant and cooperative in NAD Head:  Normocephalic and atraumatic. Neck:  Supple; no masses or thyromegaly. Lungs:  Clear throughout to auscultation, normal respiratory effort.    Heart:  +S1, +S2, Regular rate and rhythm, No edema. Abdomen:  Soft, nontender and nondistended. Normal bowel sounds, without guarding, and without rebound.   Neurologic:  Alert and  oriented x4;  grossly normal neurologically.  Impression/Plan: Jason Hooper is here for an colonoscopy to be performed for Screening colonoscopy average risk   Risks, benefits, limitations, and alternatives regarding  colonoscopy have been reviewed with the patient.  Questions have been answered.  All parties agreeable.   Jason Kung, MD  06/23/2024, 9:12 AM

## 2024-06-23 NOTE — Transfer of Care (Signed)
 Immediate Anesthesia Transfer of Care Note  Patient: Jason Hooper  Procedure(s) Performed: COLONOSCOPY  Patient Location: PACU  Anesthesia Type:General  Level of Consciousness: sedated  Airway & Oxygen Therapy: Patient Spontanous Breathing  Post-op Assessment: Report given to RN and Post -op Vital signs reviewed and stable  Post vital signs: Reviewed and stable  Last Vitals:  Vitals Value Taken Time  BP 109/73 06/23/24 10:11  Temp    Pulse 65 06/23/24 10:12  Resp 15 06/23/24 10:12  SpO2 100 % 06/23/24 10:12  Vitals shown include unfiled device data.  Last Pain:  Vitals:   06/23/24 1011  TempSrc:   PainSc: Asleep         Complications: No notable events documented.

## 2024-06-23 NOTE — Op Note (Signed)
 [facility] Gastroenterology Patient Name: Jason Hooper Procedure Date: 06/23/2024 9:40 AM MRN: [id number] Account #: [id number] Date of Birth: August 26, 1966 Admit Type: Outpatient Age: 58 Room: [facility] ENDO ROOM 4 Gender: Male Note Status: Finalized Instrument Name: Colon Scope [id number] Procedure:             Colonoscopy Indications:           Screening for colorectal malignant neoplasm Providers:             Ruel Kung MD, MD Referring MD:          Rollene MATSU. Arnett (Referring MD) Medicines:             Monitored Anesthesia Care Complications:         No immediate complications. Procedure:             Pre-Anesthesia Assessment:                        - Prior to the procedure, a History and Physical was                         performed, and patient medications, allergies and                         sensitivities were reviewed. The patient's tolerance                         of previous anesthesia was reviewed.                        - The risks and benefits of the procedure and the                         sedation options and risks were discussed with the                         patient. All questions were answered and informed                         consent was obtained.                        - ASA Grade Assessment: II - A patient with mild                         systemic disease.                        After obtaining informed consent, the colonoscope was                         passed under direct vision. Throughout the procedure,                         the patient's blood pressure, pulse, and oxygen                         saturations were monitored continuously. The                         Colonoscope was  introduced through the anus and                         advanced to the the cecum, identified by the                         appendiceal orifice. The colonoscopy was performed                         with ease. The patient tolerated the procedure well.                          The quality of the bowel preparation was excellent.                         The ileocecal valve, appendiceal orifice, and rectum                         were photographed. Findings:      The perianal and digital rectal examinations were normal.      Four sessile polyps were found in the descending colon. The polyps were       4 to 5 mm in size. These polyps were removed with a hot snare. Resection       and retrieval were complete.      Multiple medium-mouthed diverticula were found in the descending colon.      The exam was otherwise without abnormality on direct and retroflexion       views. Impression:            - Four 4 to 5 mm polyps in the descending colon,                         removed with a hot snare. Resected and retrieved.                        - Diverticulosis in the descending colon.                        - The examination was otherwise normal on direct and                         retroflexion views. Recommendation:        - Discharge patient to home (with escort).                        - Resume previous diet.                        - Continue present medications.                        - Await pathology results.                        - Repeat colonoscopy for surveillance based on                         pathology results. Procedure Code(s):     --- Professional ---  54614, Colonoscopy, flexible; with removal of                         tumor(s), polyp(s), or other lesion(s) by snare                         technique Diagnosis Code(s):     --- Professional ---                        Z12.11, Encounter for screening for malignant neoplasm                         of colon                        D12.4, Benign neoplasm of descending colon                        K57.30, Diverticulosis of large intestine without                         perforation or abscess without bleeding CPT copyright 2022 American Medical Association. All rights  reserved. The codes documented in this report are preliminary and upon coder review may  be revised to meet current compliance requirements. Ruel Kung, MD Ruel Kung MD, MD 06/23/2024 10:07:42 AM This report has been signed electronically. Number of Addenda: 0 Note Initiated On: 06/23/2024 9:40 AM Scope Withdrawal Time: 0 hours 10 minutes 4 seconds  Total Procedure Duration: 0 hours 11 minutes 17 seconds  Estimated Blood Loss:  Estimated blood loss: none.      [facility]

## 2024-06-23 NOTE — Anesthesia Preprocedure Evaluation (Signed)
 Anesthesia Evaluation  Patient identified by MRN, date of birth, ID band Patient awake    Reviewed: Allergy & Precautions, NPO status , Patient's Chart, lab work & pertinent test results  Airway Mallampati: II  TM Distance: >3 FB Neck ROM: full    Dental  (+) Teeth Intact   Pulmonary neg pulmonary ROS   Pulmonary exam normal breath sounds clear to auscultation       Cardiovascular Exercise Tolerance: Good hypertension, Pt. on medications negative cardio ROS Normal cardiovascular exam Rhythm:Regular Rate:Normal     Neuro/Psych     Bipolar Disorder   negative neurological ROS  negative psych ROS   GI/Hepatic negative GI ROS, Neg liver ROS,GERD  Medicated,,  Endo/Other  negative endocrine ROS    Renal/GU negative Renal ROS  negative genitourinary   Musculoskeletal  (+) Arthritis ,    Abdominal  (+) + obese  Peds negative pediatric ROS (+)  Hematology negative hematology ROS (+)   Anesthesia Other Findings Past Medical History: No date: Alcohol abuse No date: Allergy 07/29/2022: Atypical mole     Comment:  Right pst base of neck - needs surgery No date: Bipolar disorder (HCC) No date: GERD (gastroesophageal reflux disease) 2014: Kidney stones  Past Surgical History: 01/24/2018: COLONOSCOPY WITH PROPOFOL ; N/A     Comment:  Procedure: COLONOSCOPY WITH PROPOFOL ;  Surgeon: Therisa Bi, MD;  Location: [facility] ENDOSCOPY;  Service:               Gastroenterology;  Laterality: N/A; No date: ESOPHAGEAL DILATION 01/24/2018: ESOPHAGOGASTRODUODENOSCOPY (EGD) WITH PROPOFOL ; N/A     Comment:  Procedure: ESOPHAGOGASTRODUODENOSCOPY (EGD) WITH               PROPOFOL ;  Surgeon: Therisa Bi, MD;  Location: [facility]               ENDOSCOPY;  Service: Gastroenterology;  Laterality: N/A; 07/16/2017: JOINT REPLACEMENT; Right     Comment:  R great toe No date: TONSILECTOMY/ADENOIDECTOMY WITH MYRINGOTOMY  BMI    Body Mass  Index: 27.18 kg/m      Reproductive/Obstetrics negative OB ROS                              Anesthesia Physical Anesthesia Plan  ASA: 2  Anesthesia Plan: General   Post-op Pain Management:    Induction: Intravenous  PONV Risk Score and Plan: Propofol  infusion and TIVA  Airway Management Planned: Natural Airway and Nasal Cannula  Additional Equipment:   Intra-op Plan:   Post-operative Plan:   Informed Consent: I have reviewed the patients History and Physical, chart, labs and discussed the procedure including the risks, benefits and alternatives for the proposed anesthesia with the patient or authorized representative who has indicated his/her understanding and acceptance.     Dental Advisory Given  Plan Discussed with: CRNA  Anesthesia Plan Comments:         Anesthesia Quick Evaluation

## 2024-06-23 NOTE — Anesthesia Postprocedure Evaluation (Signed)
 Anesthesia Post Note  Patient: Jason Hooper  Procedure(s) Performed: COLONOSCOPY  Patient location during evaluation: PACU Anesthesia Type: General Level of consciousness: awake and awake and alert Pain management: satisfactory to patient Vital Signs Assessment: post-procedure vital signs reviewed and stable Respiratory status: spontaneous breathing Cardiovascular status: blood pressure returned to baseline Anesthetic complications: no   No notable events documented.   Last Vitals:  Vitals:   06/23/24 1011 06/23/24 1021  BP: 109/73 118/76  Pulse: 66   Resp: 18 14  Temp:    SpO2: 100% 100%    Last Pain:  Vitals:   06/23/24 1021  TempSrc:   PainSc: 0-No pain                 VAN STAVEREN,Hudson Majkowski

## 2024-06-26 LAB — SURGICAL PATHOLOGY

## 2024-09-15 ENCOUNTER — Encounter: Payer: Self-pay | Admitting: Family

## 2024-09-15 ENCOUNTER — Ambulatory Visit: Admitting: Family

## 2024-09-15 VITALS — BP 120/78 | HR 67 | Temp 98.6°F | Ht 73.0 in | Wt 212.2 lb

## 2024-09-15 DIAGNOSIS — M79642 Pain in left hand: Secondary | ICD-10-CM

## 2024-09-15 DIAGNOSIS — I10 Essential (primary) hypertension: Secondary | ICD-10-CM

## 2024-09-15 DIAGNOSIS — G8929 Other chronic pain: Secondary | ICD-10-CM | POA: Diagnosis not present

## 2024-09-15 DIAGNOSIS — M545 Low back pain, unspecified: Secondary | ICD-10-CM | POA: Diagnosis not present

## 2024-09-15 DIAGNOSIS — M79641 Pain in right hand: Secondary | ICD-10-CM | POA: Diagnosis not present

## 2024-09-15 MED ORDER — TRAMADOL HCL 50 MG PO TABS: 50.0000 mg | ORAL_TABLET | Freq: Three times a day (TID) | ORAL | 2 refills | Status: AC

## 2024-09-15 MED ORDER — MELOXICAM 7.5 MG PO TABS: ORAL_TABLET | ORAL | 1 refills | Status: AC

## 2024-09-15 MED ORDER — CYCLOBENZAPRINE HCL 10 MG PO TABS: ORAL_TABLET | ORAL | 1 refills | Status: AC

## 2024-09-15 NOTE — Assessment & Plan Note (Signed)
 Chronic, stable.  Continue tramadol  150mg  daily,  Flexeril  10 mg nightly prn.  He is taking mobic  prn.  Close follow-up

## 2024-09-15 NOTE — Assessment & Plan Note (Signed)
 Chronic, overall stable. Continue  amlodipine  5 mg, propranolol  40 mg daily.  Discussed decreasing dose of propranolol  as heart rate low end of normal.  Patient politely declines in the absence of dizziness.  He would like to continue regimen as is with close vigilance

## 2024-09-15 NOTE — Patient Instructions (Signed)
 Referral to [facility], Dr Francisco   [facility] [address]  Monday-Friday 8am-9pm Saturday and Sunday 9am- 9pm   [phone]

## 2024-09-15 NOTE — Progress Notes (Signed)
 Assessment & Plan:  Chronic left-sided low back pain without sciatica Assessment & Plan: Chronic, stable.  Continue tramadol  150mg  daily,  Flexeril  10 mg nightly prn.  He is taking mobic  prn.  Close follow-up  Orders: -     Cyclobenzaprine  HCl; Take 1  (10 mg) tablet by  mouth at bedtime as needed for muscle spasms  Dispense: 90 tablet; Refill: 1 -     Meloxicam ; TAKE 1 TABLET BY MOUTH EVERY DAY AS NEEDED FOR PAIN  Dispense: 90 tablet; Refill: 1 -     traMADol  HCl; Take 1 tablet (50 mg total) by mouth 3 (three) times daily.  Dispense: 90 tablet; Refill: 2  Bilateral hand pain Assessment & Plan: Presentation consistent with Dupuytren contracture bilateral fifth finger.  Numbness question if ulnar nerve neuropathy. No neck pain. Referral to [facility] Dr. Francisco.Will follow.   Orders: -     Ambulatory referral to Orthopedic Surgery  Primary hypertension Assessment & Plan: Chronic, overall stable. Continue  amlodipine  5 mg, propranolol  40 mg daily.  Discussed decreasing dose of propranolol  as heart rate low end of normal.  Patient politely declines in the absence of dizziness.  He would like to continue regimen as is with close vigilance      Return precautions given.   Risks, benefits, and alternatives of the medications and treatment plan prescribed today were discussed, and patient expressed understanding.   Education regarding symptom management and diagnosis given to patient on AVS either electronically or printed.  Return in about 3 months (around 12/14/2024).  Rollene Northern, FNP  Subjective:    [name], male    DOB: 1966/02/13, 58 y.o.   MRN: [id number]  CC: Jason Hooper is a 58 y.o. male who presents today for follow up.   HPI: He complains of right 5th finger pain and difficulty extending.   left 5th finger is starting to the same.   Numbness in left pinky, episodic.   No neck pain, upper arm numbness   Low back pain is at baseline.  Regimen  with tramadol , meloxicam  is working well for him.   He remains compliant with amlodipine  5 mg daily, propranolol  40 mg twice daily.  Denies dizziness, shortness of breath. He thinks originally propranolol  started for anxiety. He remains compliant with tramadol  150 mg daily, meloxicam  7.5 mg daily.  He takes Flexeril  10 mg daily as needed   colonoscopy 06/23/2024, Dr. Therisa.  4 polyps removed.  Allergies: Patient has no known allergies. Current Outpatient Medications on File Prior to Visit  Medication Sig Dispense Refill   amLODipine  (NORVASC ) 5 MG tablet Take 1 tablet (5 mg total) by mouth daily. 90 tablet 3   montelukast  (SINGULAIR ) 10 MG tablet TAKE 1 TABLET BY MOUTH EVERYDAY AT BEDTIME 90 tablet 3   omeprazole  (PRILOSEC) 20 MG capsule Take 1 capsule (20 mg total) by mouth daily. 90 capsule 2   propranolol  (INDERAL ) 40 MG tablet Take 1 tablet (40 mg total) by mouth 2 (two) times daily. 180 tablet 3   rosuvastatin  (CRESTOR ) 5 MG tablet Take 1 tablet (5 mg total) by mouth daily. 90 tablet 3   tadalafil  (CIALIS ) 10 MG tablet TAKE 1 TABLET (10 MG TOTAL) BY MOUTH EVERY OTHER DAY AS NEEDED FOR ERECTILE DYSFUNCTION. 15 tablet 6   No current facility-administered medications on file prior to visit.    Review of Systems  Constitutional:  Negative for chills and fever.  Respiratory:  Negative for cough.   Cardiovascular:  Negative for chest pain and palpitations.  Gastrointestinal:  Negative for nausea and vomiting.  Musculoskeletal:  Positive for back pain (at baseline).  Neurological:  Positive for numbness (left 5th finger).      Objective:    BP 120/78   Pulse 67   Temp 98.6 F (37 C) (Oral)   Ht 6' 1 (1.854 m)   Wt 212 lb 3.2 oz (96.3 kg)   SpO2 97%   BMI 28.00 kg/m  BP Readings from Last 3 Encounters:  09/15/24 120/78  06/23/24 118/76  06/09/24 122/80   Wt Readings from Last 3 Encounters:  09/15/24 212 lb 3.2 oz (96.3 kg)  06/23/24 206 lb (93.4 kg)  06/09/24 210 lb 3.2  oz (95.3 kg)    Physical Exam Vitals reviewed.  Constitutional:      Appearance: He is well-developed.  Cardiovascular:     Rate and Rhythm: Regular rhythm.     Heart sounds: Normal heart sounds.  Pulmonary:     Effort: Pulmonary effort is normal. No respiratory distress.     Breath sounds: Normal breath sounds. No wheezing, rhonchi or rales.  Musculoskeletal:     Comments: Contracture of bilateral fifth fingers.  Grip strength normal.  Sensation intact  Skin:    General: Skin is warm and dry.  Neurological:     Mental Status: He is alert.  Psychiatric:        Speech: Speech normal.        Behavior: Behavior normal.

## 2024-09-15 NOTE — Assessment & Plan Note (Signed)
 Presentation consistent with Dupuytren contracture bilateral fifth finger.  Numbness question if ulnar nerve neuropathy. No neck pain. Referral to [facility] Dr. Francisco.Will follow.

## 2024-11-08 ENCOUNTER — Other Ambulatory Visit: Payer: Self-pay | Admitting: Family

## 2024-11-08 DIAGNOSIS — K219 Gastro-esophageal reflux disease without esophagitis: Secondary | ICD-10-CM

## 2024-11-08 DIAGNOSIS — I10 Essential (primary) hypertension: Secondary | ICD-10-CM

## 2024-12-15 ENCOUNTER — Ambulatory Visit: Admitting: Family
